# Patient Record
Sex: Female | Born: 1965 | Race: Black or African American | Hispanic: No | Smoking: Former smoker
Health system: Southern US, Community
[De-identification: ages and names within clinical notes are randomized; demographics above are authoritative.]

## PROBLEM LIST (undated history)

## (undated) DIAGNOSIS — F419 Anxiety disorder, unspecified: Secondary | ICD-10-CM

## (undated) DIAGNOSIS — I10 Essential (primary) hypertension: Secondary | ICD-10-CM

## (undated) DIAGNOSIS — D649 Anemia, unspecified: Secondary | ICD-10-CM

## (undated) DIAGNOSIS — F32A Depression, unspecified: Secondary | ICD-10-CM

## (undated) DIAGNOSIS — K219 Gastro-esophageal reflux disease without esophagitis: Secondary | ICD-10-CM

## (undated) DIAGNOSIS — F209 Schizophrenia, unspecified: Secondary | ICD-10-CM

## (undated) DIAGNOSIS — I509 Heart failure, unspecified: Secondary | ICD-10-CM

## (undated) DIAGNOSIS — M24549 Contracture, unspecified hand: Secondary | ICD-10-CM

## (undated) DIAGNOSIS — M1990 Unspecified osteoarthritis, unspecified site: Secondary | ICD-10-CM

## (undated) DIAGNOSIS — F329 Major depressive disorder, single episode, unspecified: Secondary | ICD-10-CM

## (undated) DIAGNOSIS — I209 Angina pectoris, unspecified: Secondary | ICD-10-CM

## (undated) DIAGNOSIS — G8250 Quadriplegia, unspecified: Secondary | ICD-10-CM

## (undated) DIAGNOSIS — N319 Neuromuscular dysfunction of bladder, unspecified: Secondary | ICD-10-CM

## (undated) DIAGNOSIS — J189 Pneumonia, unspecified organism: Secondary | ICD-10-CM

## (undated) DIAGNOSIS — Z9981 Dependence on supplemental oxygen: Secondary | ICD-10-CM

## (undated) DIAGNOSIS — I82401 Acute embolism and thrombosis of unspecified deep veins of right lower extremity: Secondary | ICD-10-CM

## (undated) DIAGNOSIS — N200 Calculus of kidney: Secondary | ICD-10-CM

## (undated) DIAGNOSIS — Z9289 Personal history of other medical treatment: Secondary | ICD-10-CM

## (undated) DIAGNOSIS — J9690 Respiratory failure, unspecified, unspecified whether with hypoxia or hypercapnia: Secondary | ICD-10-CM

## (undated) HISTORY — PX: CYSTOSCOPY/RETROGRADE/URETEROSCOPY/STONE EXTRACTION WITH BASKET: SHX5317

## (undated) HISTORY — PX: GASTROSTOMY W/ FEEDING TUBE: SUR642

## (undated) HISTORY — PX: LITHOTRIPSY: SUR834

## (undated) HISTORY — PX: PEG PLACEMENT: SHX5437

---
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
## 1994-02-12 HISTORY — PX: TUBAL LIGATION: SHX77

## 2009-02-12 HISTORY — PX: TRACHEOSTOMY: SUR1362

## 2009-05-13 ENCOUNTER — Inpatient Hospital Stay (HOSPITAL_COMMUNITY): Admission: EM | Admit: 2009-05-13 | Discharge: 2009-05-16 | Payer: Self-pay | Admitting: Emergency Medicine

## 2009-10-02 ENCOUNTER — Emergency Department (HOSPITAL_COMMUNITY): Admission: EM | Admit: 2009-10-02 | Discharge: 2009-10-03 | Payer: Self-pay | Admitting: Emergency Medicine

## 2009-10-27 ENCOUNTER — Emergency Department (HOSPITAL_COMMUNITY): Admission: EM | Admit: 2009-10-27 | Discharge: 2009-10-28 | Payer: Self-pay | Admitting: Emergency Medicine

## 2009-12-06 ENCOUNTER — Ambulatory Visit: Payer: Self-pay | Admitting: Pulmonary Disease

## 2009-12-06 ENCOUNTER — Inpatient Hospital Stay: Admission: EM | Admit: 2009-12-06 | Discharge: 2010-01-04 | Payer: Self-pay | Admitting: Internal Medicine

## 2010-01-15 ENCOUNTER — Inpatient Hospital Stay (HOSPITAL_COMMUNITY)
Admission: EM | Admit: 2010-01-15 | Discharge: 2010-01-20 | Payer: Self-pay | Source: Home / Self Care | Attending: Internal Medicine | Admitting: Internal Medicine

## 2010-04-25 LAB — RETICULOCYTES
RBC.: 3.25 MIL/uL — ABNORMAL LOW (ref 3.87–5.11)
RBC.: 3.49 MIL/uL — ABNORMAL LOW (ref 3.87–5.11)
RBC.: 3.73 MIL/uL — ABNORMAL LOW (ref 3.87–5.11)
Retic Count, Absolute: 48.9 10*3/uL (ref 19.0–186.0)
Retic Ct Pct: 1.2 % (ref 0.4–3.1)

## 2010-04-25 LAB — URINE CULTURE
Colony Count: 90000
Culture  Setup Time: 201112051020
Culture  Setup Time: 201111151025

## 2010-04-25 LAB — IRON AND TIBC
Iron: 44 ug/dL (ref 42–135)
Iron: 80 ug/dL (ref 42–135)
Saturation Ratios: 41 % (ref 20–55)
TIBC: 147 ug/dL — ABNORMAL LOW (ref 250–470)
UIBC: 84 ug/dL
UIBC: 222 ug/dL

## 2010-04-25 LAB — BASIC METABOLIC PANEL
BUN: 17 mg/dL (ref 6–23)
BUN: 20 mg/dL (ref 6–23)
BUN: 25 mg/dL — ABNORMAL HIGH (ref 6–23)
BUN: 30 mg/dL — ABNORMAL HIGH (ref 6–23)
BUN: 33 mg/dL — ABNORMAL HIGH (ref 6–23)
BUN: 33 mg/dL — ABNORMAL HIGH (ref 6–23)
BUN: 56 mg/dL — ABNORMAL HIGH (ref 6–23)
CO2: 26 mEq/L (ref 19–32)
CO2: 30 mEq/L (ref 19–32)
CO2: 31 mEq/L (ref 19–32)
CO2: 34 meq/L — ABNORMAL HIGH (ref 19–32)
Calcium: 8.8 mg/dL (ref 8.4–10.5)
Calcium: 8.9 mg/dL (ref 8.4–10.5)
Chloride: 100 meq/L (ref 96–112)
Chloride: 101 mEq/L (ref 96–112)
Chloride: 103 mEq/L (ref 96–112)
Chloride: 103 mEq/L (ref 96–112)
Chloride: 104 mEq/L (ref 96–112)
Chloride: 105 mEq/L (ref 96–112)
Chloride: 99 mEq/L (ref 96–112)
Creatinine, Ser: 0.8 mg/dL (ref 0.4–1.2)
Creatinine, Ser: 1 mg/dL (ref 0.4–1.2)
Creatinine, Ser: 1.14 mg/dL (ref 0.4–1.2)
Creatinine, Ser: 1.18 mg/dL (ref 0.4–1.2)
Creatinine, Ser: 1.19 mg/dL (ref 0.4–1.2)
GFR calc Af Amer: 60 mL/min — ABNORMAL LOW (ref >60)
GFR calc Af Amer: >60 mL/min (ref >60)
GFR calc non Af Amer: 50 mL/min — ABNORMAL LOW (ref >60)
GFR calc non Af Amer: 52 mL/min — ABNORMAL LOW (ref >60)
GFR calc non Af Amer: >60 mL/min (ref >60)
GFR calc non Af Amer: >60 mL/min (ref >60)
Glucose, Bld: 103 mg/dL — ABNORMAL HIGH (ref 70–99)
Glucose, Bld: 103 mg/dL — ABNORMAL HIGH (ref 70–99)
Glucose, Bld: 108 mg/dL — ABNORMAL HIGH (ref 70–99)
Glucose, Bld: 115 mg/dL — ABNORMAL HIGH (ref 70–99)
Glucose, Bld: 121 mg/dL — ABNORMAL HIGH (ref 70–99)
Glucose, Bld: 141 mg/dL — ABNORMAL HIGH (ref 70–99)
Glucose, Bld: 91 mg/dL (ref 70–99)
Potassium: 4.3 mEq/L (ref 3.5–5.1)
Potassium: 4.4 mEq/L (ref 3.5–5.1)
Potassium: 4.7 mEq/L (ref 3.5–5.1)
Potassium: 4.7 mEq/L (ref 3.5–5.1)
Potassium: 4.7 mEq/L (ref 3.5–5.1)
Potassium: 4.7 mEq/L (ref 3.5–5.1)
Potassium: 5 mEq/L (ref 3.5–5.1)
Potassium: 5.3 mEq/L — ABNORMAL HIGH (ref 3.5–5.1)
Sodium: 139 mEq/L (ref 135–145)
Sodium: 142 mEq/L (ref 135–145)

## 2010-04-25 LAB — GLUCOSE, CAPILLARY
Glucose-Capillary: 104 mg/dL — ABNORMAL HIGH (ref 70–99)
Glucose-Capillary: 120 mg/dL — ABNORMAL HIGH (ref 70–99)
Glucose-Capillary: 136 mg/dL — ABNORMAL HIGH (ref 70–99)
Glucose-Capillary: 150 mg/dL — ABNORMAL HIGH (ref 70–99)
Glucose-Capillary: 81 mg/dL (ref 70–99)
Glucose-Capillary: 86 mg/dL (ref 70–99)
Glucose-Capillary: 97 mg/dL (ref 70–99)

## 2010-04-25 LAB — CBC
HCT: 26.7 % — ABNORMAL LOW (ref 36.0–46.0)
HCT: 27.1 % — ABNORMAL LOW (ref 36.0–46.0)
HCT: 27.4 % — ABNORMAL LOW (ref 36.0–46.0)
HCT: 29.4 % — ABNORMAL LOW (ref 36.0–46.0)
HCT: 30.4 % — ABNORMAL LOW (ref 36.0–46.0)
HCT: 32.2 % — ABNORMAL LOW (ref 36.0–46.0)
HCT: 32.6 % — ABNORMAL LOW (ref 36.0–46.0)
HCT: 36.4 % (ref 36.0–46.0)
Hemoglobin: 8 g/dL — ABNORMAL LOW (ref 12.0–15.0)
Hemoglobin: 8 g/dL — ABNORMAL LOW (ref 12.0–15.0)
Hemoglobin: 8.6 g/dL — ABNORMAL LOW (ref 12.0–15.0)
Hemoglobin: 9.7 g/dL — ABNORMAL LOW (ref 12.0–15.0)
MCH: 23.5 pg — ABNORMAL LOW (ref 26.0–34.0)
MCH: 24.1 pg — ABNORMAL LOW (ref 26.0–34.0)
MCH: 24.1 pg — ABNORMAL LOW (ref 26.0–34.0)
MCH: 22.9 pg — ABNORMAL LOW (ref 26.0–34.0)
MCH: 23.7 pg — ABNORMAL LOW (ref 26.0–34.0)
MCH: 23.7 pg — ABNORMAL LOW (ref 26.0–34.0)
MCH: 24 pg — ABNORMAL LOW (ref 26.0–34.0)
MCH: 24.2 pg — ABNORMAL LOW (ref 26.0–34.0)
MCHC: 29.9 g/dL — ABNORMAL LOW (ref 30.0–36.0)
MCHC: 31.1 g/dL (ref 30.0–36.0)
MCHC: 31.4 g/dL (ref 30.0–36.0)
MCHC: 29.2 g/dL — ABNORMAL LOW (ref 30.0–36.0)
MCHC: 29.2 g/dL — ABNORMAL LOW (ref 30.0–36.0)
MCHC: 29.3 g/dL — ABNORMAL LOW (ref 30.0–36.0)
MCHC: 29.8 g/dL — ABNORMAL LOW (ref 30.0–36.0)
MCHC: 29.9 g/dL — ABNORMAL LOW (ref 30.0–36.0)
MCHC: 30.1 g/dL (ref 30.0–36.0)
MCHC: 30.8 g/dL (ref 30.0–36.0)
MCV: 76.6 fL — ABNORMAL LOW (ref 78.0–100.0)
MCV: 77.4 fL — ABNORMAL LOW (ref 78.0–100.0)
MCV: 77.9 fL — ABNORMAL LOW (ref 78.0–100.0)
MCV: 78.6 fL (ref 78.0–100.0)
MCV: 77.8 fL — ABNORMAL LOW (ref 78.0–100.0)
MCV: 77.9 fL — ABNORMAL LOW (ref 78.0–100.0)
MCV: 78.3 fL (ref 78.0–100.0)
MCV: 78.5 fL (ref 78.0–100.0)
MCV: 78.7 fL (ref 78.0–100.0)
MCV: 78.7 fL (ref 78.0–100.0)
MCV: 79.2 fL (ref 78.0–100.0)
MCV: 80.3 fL (ref 78.0–100.0)
MCV: 80.4 fL (ref 78.0–100.0)
MCV: 80.9 fL (ref 78.0–100.0)
Platelets: 145 10*3/uL — ABNORMAL LOW (ref 150–400)
Platelets: 162 10*3/uL (ref 150–400)
Platelets: 181 10*3/uL (ref 150–400)
Platelets: 226 10*3/uL (ref 150–400)
Platelets: 167 10*3/uL (ref 150–400)
Platelets: 181 10*3/uL (ref 150–400)
Platelets: 188 10*3/uL (ref 150–400)
Platelets: 202 10*3/uL (ref 150–400)
Platelets: 238 10*3/uL (ref 150–400)
RBC: 2.99 MIL/uL — ABNORMAL LOW (ref 3.87–5.11)
RBC: 3.45 MIL/uL — ABNORMAL LOW (ref 3.87–5.11)
RBC: 4.15 MIL/uL (ref 3.87–5.11)
RBC: 3.48 MIL/uL — ABNORMAL LOW (ref 3.87–5.11)
RBC: 4.41 MIL/uL (ref 3.87–5.11)
RBC: 4.5 MIL/uL (ref 3.87–5.11)
RDW: 14.4 % (ref 11.5–15.5)
RDW: 14.9 % (ref 11.5–15.5)
RDW: 13.7 % (ref 11.5–15.5)
RDW: 13.8 % (ref 11.5–15.5)
RDW: 14.1 % (ref 11.5–15.5)
RDW: 14.9 % (ref 11.5–15.5)
RDW: 14.9 % (ref 11.5–15.5)
RDW: 15.3 % (ref 11.5–15.5)
RDW: 15.3 % (ref 11.5–15.5)
WBC: 11.3 10*3/uL — ABNORMAL HIGH (ref 4.0–10.5)
WBC: 11.6 10*3/uL — ABNORMAL HIGH (ref 4.0–10.5)
WBC: 7.6 10*3/uL (ref 4.0–10.5)
WBC: 7.6 10*3/uL (ref 4.0–10.5)
WBC: 7.6 10*3/uL (ref 4.0–10.5)
WBC: 7.9 10*3/uL (ref 4.0–10.5)

## 2010-04-25 LAB — PROCALCITONIN
Procalcitonin: 1.22 ng/mL
Procalcitonin: <0.1 ng/mL

## 2010-04-25 LAB — CROSSMATCH: Unit division: 0

## 2010-04-25 LAB — CULTURE, BLOOD (ROUTINE X 2)
Culture  Setup Time: 201111160416
Culture: NO GROWTH
Culture: NO GROWTH
Culture: NO GROWTH

## 2010-04-25 LAB — DIFFERENTIAL
Basophils Absolute: 0 10*3/uL (ref 0.0–0.1)
Basophils Absolute: 0 10*3/uL (ref 0.0–0.1)
Basophils Relative: 0 % (ref 0–1)
Basophils Relative: 0 % (ref 0–1)
Eosinophils Absolute: 0 10*3/uL (ref 0.0–0.7)
Eosinophils Absolute: 0.6 10*3/uL (ref 0.0–0.7)
Eosinophils Relative: 3 % (ref 0–5)
Eosinophils Relative: 8 % — ABNORMAL HIGH (ref 0–5)
Lymphocytes Relative: 17 % (ref 12–46)
Lymphocytes Relative: 32 % (ref 12–46)
Lymphs Abs: 2.4 10*3/uL (ref 0.7–4.0)
Monocytes Absolute: 0.7 10*3/uL (ref 0.1–1.0)
Monocytes Relative: 10 % (ref 3–12)
Monocytes Relative: 10 % (ref 3–12)
Neutro Abs: 4.2 10*3/uL (ref 1.7–7.7)
Neutrophils Relative %: 77 % (ref 43–77)
Neutrophils Relative %: 50 % (ref 43–77)

## 2010-04-25 LAB — URINALYSIS, ROUTINE W REFLEX MICROSCOPIC
Bilirubin Urine: NEGATIVE
Ketones, ur: NEGATIVE mg/dL
Ketones, ur: NEGATIVE mg/dL
Nitrite: POSITIVE — AB
Nitrite: POSITIVE — AB
Protein, ur: 30 mg/dL — AB
Protein, ur: 100 mg/dL — AB
Specific Gravity, Urine: 1.009 (ref 1.005–1.030)
Urobilinogen, UA: 0.2 mg/dL (ref 0.0–1.0)
pH: 7 (ref 5.0–8.0)

## 2010-04-25 LAB — WOUND CULTURE
Culture: NO GROWTH
Gram Stain: NONE SEEN

## 2010-04-25 LAB — FOLATE
Folate: >20 ng/mL
Folate: >20 ng/mL

## 2010-04-25 LAB — COMPREHENSIVE METABOLIC PANEL
ALT: 12 U/L (ref 0–35)
ALT: 24 U/L (ref 0–35)
AST: 10 U/L (ref 0–37)
Albumin: 2 g/dL — ABNORMAL LOW (ref 3.5–5.2)
Albumin: 3.1 g/dL — ABNORMAL LOW (ref 3.5–5.2)
Alkaline Phosphatase: 53 U/L (ref 39–117)
Alkaline Phosphatase: 77 U/L (ref 39–117)
BUN: 17 mg/dL (ref 6–23)
BUN: 19 mg/dL (ref 6–23)
BUN: 7 mg/dL (ref 6–23)
CO2: 23 mEq/L (ref 19–32)
Chloride: 101 mEq/L (ref 96–112)
Chloride: 115 mEq/L — ABNORMAL HIGH (ref 96–112)
Creatinine, Ser: 1.3 mg/dL — ABNORMAL HIGH (ref 0.4–1.2)
GFR calc non Af Amer: >60 mL/min (ref >60)
Glucose, Bld: 89 mg/dL (ref 70–99)
Potassium: 3.6 mEq/L (ref 3.5–5.1)
Potassium: 4.1 mEq/L (ref 3.5–5.1)
Sodium: 144 mEq/L (ref 135–145)
Total Bilirubin: 0.5 mg/dL (ref 0.3–1.2)
Total Bilirubin: 0.6 mg/dL (ref 0.3–1.2)
Total Bilirubin: 0.7 mg/dL (ref 0.3–1.2)
Total Protein: 6.4 g/dL (ref 6.0–8.3)

## 2010-04-25 LAB — HEMOGLOBIN A1C: Mean Plasma Glucose: 120 mg/dL — ABNORMAL HIGH (ref <117)

## 2010-04-25 LAB — FERRITIN
Ferritin: 1000 ng/mL — ABNORMAL HIGH (ref 10–291)
Ferritin: 1081 ng/mL — ABNORMAL HIGH (ref 10–291)

## 2010-04-25 LAB — D-DIMER, QUANTITATIVE: D-Dimer, Quant: 1.87 ug/mL-FEU — ABNORMAL HIGH (ref 0.00–0.48)

## 2010-04-25 LAB — CK TOTAL AND CKMB (NOT AT ARMC)
CK, MB: 1.3 ng/mL (ref 0.3–4.0)
Relative Index: INVALID (ref 0.0–2.5)
Total CK: 45 U/L (ref 7–177)

## 2010-04-25 LAB — BLOOD GAS, ARTERIAL
Acid-Base Excess: 1.4 mmol/L (ref 0.0–2.0)
Drawn by: 309961
O2 Saturation: 86.5 %
Patient temperature: 98.6

## 2010-04-25 LAB — CULTURE, RESPIRATORY W GRAM STAIN

## 2010-04-25 LAB — URINE MICROSCOPIC-ADD ON

## 2010-04-25 LAB — VITAMIN B12: Vitamin B-12: 632 pg/mL (ref 211–911)

## 2010-04-25 LAB — HEMOCCULT GUIAC POC 1CARD (OFFICE)
Fecal Occult Bld: NEGATIVE
Fecal Occult Bld: NEGATIVE

## 2010-04-25 LAB — LEGIONELLA ANTIGEN, URINE: Legionella Antigen, Urine: NEGATIVE

## 2010-04-25 LAB — VANCOMYCIN, TROUGH: Vancomycin Tr: 37.1 ug/mL (ref 10.0–20.0)

## 2010-04-25 LAB — PREALBUMIN: Prealbumin: 17.3 mg/dL — ABNORMAL LOW (ref 18.0–45.0)

## 2010-04-25 LAB — LACTIC ACID, PLASMA: Lactic Acid, Venous: 0.8 mmol/L (ref 0.5–2.2)

## 2010-04-26 LAB — URINE CULTURE
Colony Count: NO GROWTH
Culture  Setup Time: 201110260218
Culture: NO GROWTH

## 2010-04-26 LAB — BASIC METABOLIC PANEL
BUN: 43 mg/dL — ABNORMAL HIGH (ref 6–23)
BUN: 45 mg/dL — ABNORMAL HIGH (ref 6–23)
BUN: 56 mg/dL — ABNORMAL HIGH (ref 6–23)
CO2: 32 meq/L (ref 19–32)
CO2: 33 mEq/L — ABNORMAL HIGH (ref 19–32)
CO2: 34 mEq/L — ABNORMAL HIGH (ref 19–32)
CO2: 36 mEq/L — ABNORMAL HIGH (ref 19–32)
CO2: 36 mEq/L — ABNORMAL HIGH (ref 19–32)
Calcium: 8.6 mg/dL (ref 8.4–10.5)
Calcium: 8.6 mg/dL (ref 8.4–10.5)
Calcium: 8.9 mg/dL (ref 8.4–10.5)
Calcium: 9 mg/dL (ref 8.4–10.5)
Calcium: 9.3 mg/dL (ref 8.4–10.5)
Chloride: 104 mEq/L (ref 96–112)
Chloride: 95 mEq/L — ABNORMAL LOW (ref 96–112)
Chloride: 96 meq/L (ref 96–112)
Chloride: 97 mEq/L (ref 96–112)
Creatinine, Ser: 0.79 mg/dL (ref 0.4–1.2)
Creatinine, Ser: 0.97 mg/dL (ref 0.4–1.2)
Creatinine, Ser: 1.19 mg/dL (ref 0.4–1.2)
Creatinine, Ser: 1.23 mg/dL — ABNORMAL HIGH (ref 0.4–1.2)
Creatinine, Ser: 1.5 mg/dL — ABNORMAL HIGH (ref 0.4–1.2)
GFR calc Af Amer: 45 mL/min — ABNORMAL LOW (ref >60)
GFR calc Af Amer: 57 mL/min — ABNORMAL LOW (ref >60)
GFR calc Af Amer: 59 mL/min — ABNORMAL LOW (ref >60)
GFR calc Af Amer: >60 mL/min (ref >60)
GFR calc non Af Amer: 38 mL/min — ABNORMAL LOW (ref >60)
GFR calc non Af Amer: 47 mL/min — ABNORMAL LOW (ref >60)
GFR calc non Af Amer: 49 mL/min — ABNORMAL LOW (ref >60)
GFR calc non Af Amer: >60 mL/min (ref >60)
Glucose, Bld: 104 mg/dL — ABNORMAL HIGH (ref 70–99)
Glucose, Bld: 107 mg/dL — ABNORMAL HIGH (ref 70–99)
Glucose, Bld: 117 mg/dL — ABNORMAL HIGH (ref 70–99)
Glucose, Bld: 133 mg/dL — ABNORMAL HIGH (ref 70–99)
Glucose, Bld: 97 mg/dL (ref 70–99)
Potassium: 4.9 meq/L (ref 3.5–5.1)
Potassium: 5.2 meq/L — ABNORMAL HIGH (ref 3.5–5.1)
Potassium: 6.2 mEq/L — ABNORMAL HIGH (ref 3.5–5.1)
Sodium: 134 meq/L — ABNORMAL LOW (ref 135–145)
Sodium: 139 meq/L (ref 135–145)
Sodium: 141 mEq/L (ref 135–145)
Sodium: 142 mEq/L (ref 135–145)

## 2010-04-26 LAB — CBC
HCT: 25.7 % — ABNORMAL LOW (ref 36.0–46.0)
HCT: 30.7 % — ABNORMAL LOW (ref 36.0–46.0)
HCT: 31.8 % — ABNORMAL LOW (ref 36.0–46.0)
Hemoglobin: 9.1 g/dL — ABNORMAL LOW (ref 12.0–15.0)
Hemoglobin: 9.4 g/dL — ABNORMAL LOW (ref 12.0–15.0)
MCH: 22.5 pg — ABNORMAL LOW (ref 26.0–34.0)
MCH: 22.6 pg — ABNORMAL LOW (ref 26.0–34.0)
MCH: 22.9 pg — ABNORMAL LOW (ref 26.0–34.0)
MCH: 23 pg — ABNORMAL LOW (ref 26.0–34.0)
MCHC: 29.6 g/dL — ABNORMAL LOW (ref 30.0–36.0)
MCHC: 29.6 g/dL — ABNORMAL LOW (ref 30.0–36.0)
MCHC: 29.9 g/dL — ABNORMAL LOW (ref 30.0–36.0)
MCHC: 30.2 g/dL (ref 30.0–36.0)
MCV: 74.4 fL — ABNORMAL LOW (ref 78.0–100.0)
MCV: 76.4 fL — ABNORMAL LOW (ref 78.0–100.0)
MCV: 76.5 fL — ABNORMAL LOW (ref 78.0–100.0)
MCV: 77.8 fL — ABNORMAL LOW (ref 78.0–100.0)
Platelets: 176 10*3/uL (ref 150–400)
Platelets: 221 10*3/uL (ref 150–400)
Platelets: 283 10*3/uL (ref 150–400)
Platelets: 297 10*3/uL (ref 150–400)
RBC: 3.47 MIL/uL — ABNORMAL LOW (ref 3.87–5.11)
RBC: 4.02 MIL/uL (ref 3.87–5.11)
RBC: 4.09 MIL/uL (ref 3.87–5.11)
RDW: 13.2 % (ref 11.5–15.5)
RDW: 13.5 % (ref 11.5–15.5)
RDW: 14 % (ref 11.5–15.5)
RDW: 14 % (ref 11.5–15.5)
WBC: 10.1 10*3/uL (ref 4.0–10.5)
WBC: 11.1 10*3/uL — ABNORMAL HIGH (ref 4.0–10.5)
WBC: 8.9 10*3/uL (ref 4.0–10.5)
WBC: 9 10*3/uL (ref 4.0–10.5)

## 2010-04-26 LAB — IRON AND TIBC
Saturation Ratios: 19 % — ABNORMAL LOW (ref 20–55)
TIBC: 155 ug/dL — ABNORMAL LOW (ref 250–470)

## 2010-04-26 LAB — DIFFERENTIAL
Basophils Absolute: 0 10*3/uL (ref 0.0–0.1)
Basophils Absolute: 0.1 10*3/uL (ref 0.0–0.1)
Basophils Relative: 1 % (ref 0–1)
Eosinophils Absolute: 0.4 10*3/uL (ref 0.0–0.7)
Eosinophils Absolute: 0.4 10*3/uL (ref 0.0–0.7)
Eosinophils Relative: 4 % (ref 0–5)
Eosinophils Relative: 4 % (ref 0–5)
Lymphocytes Relative: 38 % (ref 12–46)
Lymphs Abs: 2.7 10*3/uL (ref 0.7–4.0)
Lymphs Abs: 3.1 10*3/uL (ref 0.7–4.0)
Monocytes Absolute: 0.8 10*3/uL (ref 0.1–1.0)
Monocytes Relative: 7 % (ref 3–12)
Monocytes Relative: 7 % (ref 3–12)
Monocytes Relative: 9 % (ref 3–12)
Neutro Abs: 4.6 10*3/uL (ref 1.7–7.7)
Neutro Abs: 4.9 10*3/uL (ref 1.7–7.7)
Neutrophils Relative %: 56 % (ref 43–77)

## 2010-04-26 LAB — BLOOD GAS, ARTERIAL
Acid-Base Excess: 5.6 mmol/L — ABNORMAL HIGH (ref 0.0–2.0)
Bicarbonate: 29.7 mEq/L — ABNORMAL HIGH (ref 20.0–24.0)
FIO2: 0.4 %
RATE: 16 resp/min
TCO2: 31 mmol/L (ref 0–100)
pCO2 arterial: 44.6 mmHg (ref 35.0–45.0)
pO2, Arterial: 121 mmHg — ABNORMAL HIGH (ref 80.0–100.0)

## 2010-04-26 LAB — URINE MICROSCOPIC-ADD ON

## 2010-04-26 LAB — URINALYSIS, ROUTINE W REFLEX MICROSCOPIC
Bilirubin Urine: NEGATIVE
Glucose, UA: NEGATIVE mg/dL
Ketones, ur: NEGATIVE mg/dL
Ketones, ur: NEGATIVE mg/dL
Nitrite: NEGATIVE
Nitrite: NEGATIVE
Protein, ur: >300 mg/dL — AB
Protein, ur: NEGATIVE mg/dL
Specific Gravity, Urine: 1.01 (ref 1.005–1.030)
Urobilinogen, UA: 0.2 mg/dL (ref 0.0–1.0)
Urobilinogen, UA: 0.2 mg/dL (ref 0.0–1.0)
pH: 8.5 — ABNORMAL HIGH (ref 5.0–8.0)

## 2010-04-26 LAB — COMPREHENSIVE METABOLIC PANEL
ALT: <8 U/L (ref 0–35)
AST: 10 U/L (ref 0–37)
Alkaline Phosphatase: 68 U/L (ref 39–117)
GFR calc Af Amer: >60 mL/min (ref >60)
Glucose, Bld: 84 mg/dL (ref 70–99)
Potassium: 3.3 mEq/L — ABNORMAL LOW (ref 3.5–5.1)
Sodium: 139 mEq/L (ref 135–145)
Total Protein: 6 g/dL (ref 6.0–8.3)

## 2010-04-26 LAB — CULTURE, BLOOD (ROUTINE X 2)
Culture  Setup Time: 201110301845
Culture  Setup Time: 201110301845
Culture: NO GROWTH
Culture: NO GROWTH

## 2010-04-26 LAB — BRAIN NATRIURETIC PEPTIDE: Pro B Natriuretic peptide (BNP): 31 pg/mL (ref 0.0–100.0)

## 2010-04-26 LAB — VITAMIN B12: Vitamin B-12: 505 pg/mL (ref 211–911)

## 2010-04-26 LAB — PROCALCITONIN
Procalcitonin: 0.73 ng/mL
Procalcitonin: 4.85 ng/mL

## 2010-04-26 LAB — TSH: TSH: 0.306 u[IU]/mL — ABNORMAL LOW (ref 0.350–4.500)

## 2010-04-27 LAB — DIFFERENTIAL
Basophils Absolute: 0.1 10*3/uL (ref 0.0–0.1)
Eosinophils Absolute: 0.3 10*3/uL (ref 0.0–0.7)
Monocytes Absolute: 0.6 10*3/uL (ref 0.1–1.0)
Neutrophils Relative %: 54 % (ref 43–77)

## 2010-04-27 LAB — POCT CARDIAC MARKERS: Myoglobin, poc: 201 ng/mL (ref 12–200)

## 2010-04-27 LAB — URINALYSIS, ROUTINE W REFLEX MICROSCOPIC
Bilirubin Urine: NEGATIVE
Specific Gravity, Urine: 1.007 (ref 1.005–1.030)
pH: 7 (ref 5.0–8.0)

## 2010-04-27 LAB — CBC
HCT: 31.8 % — ABNORMAL LOW (ref 36.0–46.0)
Hemoglobin: 9.9 g/dL — ABNORMAL LOW (ref 12.0–15.0)
MCH: 23 pg — ABNORMAL LOW (ref 26.0–34.0)
MCHC: 31.1 g/dL (ref 30.0–36.0)
MCV: 73.8 fL — ABNORMAL LOW (ref 78.0–100.0)

## 2010-04-27 LAB — COMPREHENSIVE METABOLIC PANEL
Alkaline Phosphatase: 102 U/L (ref 39–117)
BUN: 27 mg/dL — ABNORMAL HIGH (ref 6–23)
Chloride: 105 mEq/L (ref 96–112)
Glucose, Bld: 139 mg/dL — ABNORMAL HIGH (ref 70–99)
Potassium: 4.1 mEq/L (ref 3.5–5.1)
Total Bilirubin: <0.1 mg/dL — ABNORMAL LOW (ref 0.3–1.2)

## 2010-04-27 LAB — URINE CULTURE

## 2010-04-27 LAB — URINE MICROSCOPIC-ADD ON

## 2010-05-03 LAB — URINE CULTURE: Colony Count: >=100000

## 2010-05-03 LAB — CULTURE, BLOOD (ROUTINE X 2)
Culture: NO GROWTH
Culture: NO GROWTH

## 2010-05-03 LAB — GLUCOSE, CAPILLARY
Glucose-Capillary: 110 mg/dL — ABNORMAL HIGH (ref 70–99)
Glucose-Capillary: 121 mg/dL — ABNORMAL HIGH (ref 70–99)
Glucose-Capillary: 134 mg/dL — ABNORMAL HIGH (ref 70–99)
Glucose-Capillary: 83 mg/dL (ref 70–99)
Glucose-Capillary: 88 mg/dL (ref 70–99)
Glucose-Capillary: 90 mg/dL (ref 70–99)

## 2010-05-03 LAB — URINALYSIS, ROUTINE W REFLEX MICROSCOPIC
Glucose, UA: NEGATIVE mg/dL
Protein, ur: NEGATIVE mg/dL
pH: 6.5 (ref 5.0–8.0)

## 2010-05-03 LAB — CBC
HCT: 35.6 % — ABNORMAL LOW (ref 36.0–46.0)
Hemoglobin: 9.5 g/dL — ABNORMAL LOW (ref 12.0–15.0)
Platelets: 244 10*3/uL (ref 150–400)
Platelets: 270 10*3/uL (ref 150–400)
RBC: 3.83 MIL/uL — ABNORMAL LOW (ref 3.87–5.11)
WBC: 15 10*3/uL — ABNORMAL HIGH (ref 4.0–10.5)

## 2010-05-03 LAB — COMPREHENSIVE METABOLIC PANEL
ALT: 13 U/L (ref 0–35)
AST: 18 U/L (ref 0–37)
Albumin: 3 g/dL — ABNORMAL LOW (ref 3.5–5.2)
Alkaline Phosphatase: 101 U/L (ref 39–117)
BUN: 19 mg/dL (ref 6–23)
Chloride: 102 mEq/L (ref 96–112)
GFR calc Af Amer: >60 mL/min (ref >60)
Potassium: 3.8 mEq/L (ref 3.5–5.1)
Total Bilirubin: 0.7 mg/dL (ref 0.3–1.2)

## 2010-05-03 LAB — BASIC METABOLIC PANEL
Calcium: 8.6 mg/dL (ref 8.4–10.5)
Creatinine, Ser: 0.85 mg/dL (ref 0.4–1.2)
GFR calc Af Amer: >60 mL/min (ref >60)
GFR calc non Af Amer: >60 mL/min (ref >60)
Sodium: 138 mEq/L (ref 135–145)

## 2010-05-03 LAB — URINE MICROSCOPIC-ADD ON

## 2010-05-08 LAB — CBC
HCT: 34 % — ABNORMAL LOW (ref 36.0–46.0)
Hemoglobin: 10.6 g/dL — ABNORMAL LOW (ref 12.0–15.0)
MCHC: 31.2 g/dL (ref 30.0–36.0)
MCV: 78.9 fL (ref 78.0–100.0)
RBC: 4.31 MIL/uL (ref 3.87–5.11)

## 2010-05-08 LAB — DIFFERENTIAL
Basophils Relative: 1 % (ref 0–1)
Eosinophils Absolute: 0.3 10*3/uL (ref 0.0–0.7)
Monocytes Absolute: 0.8 10*3/uL (ref 0.1–1.0)
Monocytes Relative: 6 % (ref 3–12)

## 2010-05-08 LAB — POCT I-STAT, CHEM 8
Calcium, Ion: 1.19 mmol/L (ref 1.12–1.32)
Chloride: 101 mEq/L (ref 96–112)
Glucose, Bld: 115 mg/dL — ABNORMAL HIGH (ref 70–99)
HCT: 38 % (ref 36.0–46.0)
Hemoglobin: 12.9 g/dL (ref 12.0–15.0)

## 2010-05-26 ENCOUNTER — Inpatient Hospital Stay (HOSPITAL_COMMUNITY)
Admission: EM | Admit: 2010-05-26 | Discharge: 2010-06-01 | DRG: 689 | Disposition: A | Discharge status: Skilled Nursing Facility | Payer: Medicare Other | Attending: Internal Medicine | Admitting: Internal Medicine

## 2010-05-26 ENCOUNTER — Emergency Department (HOSPITAL_COMMUNITY): Payer: Medicare Other

## 2010-05-26 DIAGNOSIS — L8992 Pressure ulcer of unspecified site, stage 2: Secondary | ICD-10-CM | POA: Diagnosis present

## 2010-05-26 DIAGNOSIS — D509 Iron deficiency anemia, unspecified: Secondary | ICD-10-CM | POA: Diagnosis present

## 2010-05-26 DIAGNOSIS — Z931 Gastrostomy status: Secondary | ICD-10-CM

## 2010-05-26 DIAGNOSIS — E46 Unspecified protein-calorie malnutrition: Secondary | ICD-10-CM | POA: Diagnosis present

## 2010-05-26 DIAGNOSIS — F3289 Other specified depressive episodes: Secondary | ICD-10-CM | POA: Diagnosis present

## 2010-05-26 DIAGNOSIS — Z981 Arthrodesis status: Secondary | ICD-10-CM

## 2010-05-26 DIAGNOSIS — N10 Acute tubulo-interstitial nephritis: Principal | ICD-10-CM | POA: Diagnosis present

## 2010-05-26 DIAGNOSIS — L89309 Pressure ulcer of unspecified buttock, unspecified stage: Secondary | ICD-10-CM | POA: Diagnosis present

## 2010-05-26 DIAGNOSIS — F329 Major depressive disorder, single episode, unspecified: Secondary | ICD-10-CM | POA: Diagnosis present

## 2010-05-26 DIAGNOSIS — E876 Hypokalemia: Secondary | ICD-10-CM | POA: Diagnosis present

## 2010-05-26 DIAGNOSIS — N1330 Unspecified hydronephrosis: Secondary | ICD-10-CM | POA: Diagnosis present

## 2010-05-26 DIAGNOSIS — Z87440 Personal history of urinary (tract) infections: Secondary | ICD-10-CM

## 2010-05-26 DIAGNOSIS — F259 Schizoaffective disorder, unspecified: Secondary | ICD-10-CM | POA: Diagnosis present

## 2010-05-26 DIAGNOSIS — M2450 Contracture, unspecified joint: Secondary | ICD-10-CM | POA: Diagnosis present

## 2010-05-26 DIAGNOSIS — B964 Proteus (mirabilis) (morganii) as the cause of diseases classified elsewhere: Secondary | ICD-10-CM | POA: Diagnosis present

## 2010-05-26 DIAGNOSIS — Z930 Tracheostomy status: Secondary | ICD-10-CM

## 2010-05-26 DIAGNOSIS — IMO0002 Reserved for concepts with insufficient information to code with codable children: Secondary | ICD-10-CM

## 2010-05-26 DIAGNOSIS — G8250 Quadriplegia, unspecified: Secondary | ICD-10-CM | POA: Diagnosis present

## 2010-05-26 LAB — BASIC METABOLIC PANEL
BUN: 22 mg/dL (ref 6–23)
Calcium: 9.3 mg/dL (ref 8.4–10.5)
Creatinine, Ser: 1.6 mg/dL — ABNORMAL HIGH (ref 0.4–1.2)
GFR calc non Af Amer: 35 mL/min — ABNORMAL LOW (ref >60)
Potassium: 4.6 mEq/L (ref 3.5–5.1)

## 2010-05-26 LAB — URINALYSIS, ROUTINE W REFLEX MICROSCOPIC
Bilirubin Urine: NEGATIVE
Ketones, ur: NEGATIVE mg/dL
Nitrite: NEGATIVE
Protein, ur: 100 mg/dL — AB
Urobilinogen, UA: 0.2 mg/dL (ref 0.0–1.0)
pH: 7 (ref 5.0–8.0)

## 2010-05-26 LAB — DIFFERENTIAL
Eosinophils Absolute: 0.5 10*3/uL (ref 0.0–0.7)
Eosinophils Relative: 3 % (ref 0–5)
Lymphs Abs: 3.4 10*3/uL (ref 0.7–4.0)
Monocytes Absolute: 1.6 10*3/uL — ABNORMAL HIGH (ref 0.1–1.0)

## 2010-05-26 LAB — CBC
MCHC: 31 g/dL (ref 30.0–36.0)
MCV: 74.9 fL — ABNORMAL LOW (ref 78.0–100.0)
Platelets: 291 10*3/uL (ref 150–400)
RDW: 14.2 % (ref 11.5–15.5)
WBC: 15.8 10*3/uL — ABNORMAL HIGH (ref 4.0–10.5)

## 2010-05-27 DIAGNOSIS — N390 Urinary tract infection, site not specified: Secondary | ICD-10-CM

## 2010-05-27 LAB — CBC
MCHC: 29.8 g/dL — ABNORMAL LOW (ref 30.0–36.0)
Platelets: 257 10*3/uL (ref 150–400)
RDW: 14.3 % (ref 11.5–15.5)
WBC: 13.1 10*3/uL — ABNORMAL HIGH (ref 4.0–10.5)

## 2010-05-27 LAB — COMPREHENSIVE METABOLIC PANEL
ALT: 18 U/L (ref 0–35)
Calcium: 8.3 mg/dL — ABNORMAL LOW (ref 8.4–10.5)
GFR calc Af Amer: 49 mL/min — ABNORMAL LOW (ref >60)
Glucose, Bld: 130 mg/dL — ABNORMAL HIGH (ref 70–99)
Sodium: 138 mEq/L (ref 135–145)
Total Protein: 6.9 g/dL (ref 6.0–8.3)

## 2010-05-29 LAB — CBC
HCT: 27.5 % — ABNORMAL LOW (ref 36.0–46.0)
MCHC: 29.5 g/dL — ABNORMAL LOW (ref 30.0–36.0)
MCV: 76.6 fL — ABNORMAL LOW (ref 78.0–100.0)
RDW: 15.3 % (ref 11.5–15.5)

## 2010-05-29 LAB — BASIC METABOLIC PANEL
BUN: 12 mg/dL (ref 6–23)
Calcium: 8.2 mg/dL — ABNORMAL LOW (ref 8.4–10.5)
Creatinine, Ser: 1.14 mg/dL (ref 0.4–1.2)
GFR calc non Af Amer: 52 mL/min — ABNORMAL LOW (ref >60)
Glucose, Bld: 96 mg/dL (ref 70–99)

## 2010-05-30 ENCOUNTER — Inpatient Hospital Stay (HOSPITAL_COMMUNITY): Payer: Medicare Other

## 2010-05-30 DIAGNOSIS — N390 Urinary tract infection, site not specified: Secondary | ICD-10-CM

## 2010-05-30 LAB — BASIC METABOLIC PANEL
Calcium: 8.4 mg/dL (ref 8.4–10.5)
GFR calc Af Amer: 59 mL/min — ABNORMAL LOW (ref >60)
GFR calc non Af Amer: 49 mL/min — ABNORMAL LOW (ref >60)
Sodium: 144 mEq/L (ref 135–145)

## 2010-05-30 LAB — CBC
MCHC: 29.3 g/dL — ABNORMAL LOW (ref 30.0–36.0)
RDW: 15.3 % (ref 11.5–15.5)

## 2010-05-30 LAB — GLUCOSE, CAPILLARY: Glucose-Capillary: 93 mg/dL (ref 70–99)

## 2010-05-31 LAB — CBC
MCH: 22.6 pg — ABNORMAL LOW (ref 26.0–34.0)
MCHC: 29.6 g/dL — ABNORMAL LOW (ref 30.0–36.0)
MCV: 76.4 fL — ABNORMAL LOW (ref 78.0–100.0)
Platelets: 235 10*3/uL (ref 150–400)
RBC: 3.81 MIL/uL — ABNORMAL LOW (ref 3.87–5.11)
RDW: 15.4 % (ref 11.5–15.5)

## 2010-05-31 LAB — URINE CULTURE
Colony Count: >=100000
Culture  Setup Time: 201204140109

## 2010-05-31 LAB — BASIC METABOLIC PANEL
BUN: 11 mg/dL (ref 6–23)
Creatinine, Ser: 1.02 mg/dL (ref 0.4–1.2)
GFR calc non Af Amer: 59 mL/min — ABNORMAL LOW (ref >60)
Glucose, Bld: 96 mg/dL (ref 70–99)
Potassium: 4.5 mEq/L (ref 3.5–5.1)

## 2010-06-01 LAB — COMPREHENSIVE METABOLIC PANEL
CO2: 28 mEq/L (ref 19–32)
Calcium: 8.8 mg/dL (ref 8.4–10.5)
Creatinine, Ser: 1.04 mg/dL (ref 0.4–1.2)
GFR calc Af Amer: >60 mL/min (ref >60)
GFR calc non Af Amer: 57 mL/min — ABNORMAL LOW (ref >60)
Glucose, Bld: 83 mg/dL (ref 70–99)

## 2010-06-01 LAB — DIFFERENTIAL
Basophils Relative: 0 % (ref 0–1)
Eosinophils Relative: 4 % (ref 0–5)
Monocytes Absolute: 0.7 10*3/uL (ref 0.1–1.0)
Monocytes Relative: 7 % (ref 3–12)
Neutro Abs: 5.5 10*3/uL (ref 1.7–7.7)

## 2010-06-01 LAB — CBC
HCT: 29.8 % — ABNORMAL LOW (ref 36.0–46.0)
Hemoglobin: 8.6 g/dL — ABNORMAL LOW (ref 12.0–15.0)
MCH: 22.1 pg — ABNORMAL LOW (ref 26.0–34.0)
MCHC: 28.9 g/dL — ABNORMAL LOW (ref 30.0–36.0)

## 2010-06-01 LAB — MAGNESIUM: Magnesium: 2.3 mg/dL (ref 1.5–2.5)
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
## 2010-06-02 LAB — CULTURE, BLOOD (ROUTINE X 2)
Culture  Setup Time: 201204140114
Culture: NO GROWTH
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
## 2010-06-13 NOTE — Group Therapy Note (Signed)
  NAMEVONNE, [name]              ACCOUNT NO.:  [id number]  MEDICAL RECORD NO.:  [id number]           PATIENT TYPE:  I  LOCATION:  1512                         FACILITY:  [facility]  PHYSICIAN:  Homero Fellers, MD   DATE OF BIRTH:  April 03, 1965                                PROGRESS NOTE   SUBJECTIVE: The patient was admitted yesterday for acute pyelonephritis with CT findings showing bilateral hydronephrosis with non obstructing stones. The patient is currently on imipenem and is currently being followed by Infectious Disease.  She has a tracheostomy tube, PEG tube, Port-A-Cath, and a Foley catheter.  OBJECTIVE: Vital signs:  Blood pressure of 113/73, pulse 97, respirations 20, temperature 97.9, O2 sat 100% on 35% trach collar.  General:  The patient is lying in bed, appeared comfortable.  She is contracted.  She is in no distress.  Neck:  Supple.  No JVD.  Lungs:  Transmitted sounds but no wheezing or crackles.  Heart:  S1, S2.  No murmurs, rubs or gallops.  Abdomen:  Obese, soft, nontender.  There is a PEG tube in the epigastric region.  Bowel sounds present.  Extremities:  1+ edema bilaterally.  Neurologic:  The patient is nonverbal because of a tracheostomy.  Laboratory:  Urine culture is growing gram-negative rods and enterococcus species is also present.  Potassium is 4.3, BUN 20, creatinine 1.4.  Liver enzymes are normal.  Hemoglobin 8.2, white count 13.1, platelet count is 257.  MRSA by PCR is positive.  ASSESSMENT AND PLAN: 1. Pyelonephritis with mild hydronephrosis:  Currently on IV     antibiotics and being followed by Infectious Disease. 2. Leukocytosis, likely secondary to #1:  This appears to be     resolving. 3. Anemia:  This will be monitored and may require transfusion if     continues to drop. 4. Right buttock decubitus ulcer:  A Wound consult has been placed. 5. Schizophrenia:  On appropriate medication. 6. Quadriplegia:  The patient continued to get  supportive care     including tracheostomy care, deep venous thrombosis prophylaxis,     and wound care. 7. Overall condition appears fairly stable.     Homero Fellers, MD     FA/MEDQ  D:  05/27/2010  T:  05/27/2010  Job:  [id number]  Electronically Signed by Homero Fellers  on 06/13/2010 02:42:14 AM

## 2010-06-13 NOTE — Group Therapy Note (Signed)
  [name], [name]              ACCOUNT NO.:  [id number]  MEDICAL RECORD NO.:  [id number]           PATIENT TYPE:  I  LOCATION:  1512                         FACILITY:  [facility]  PHYSICIAN:  Homero Fellers, MD   DATE OF BIRTH:  1965/04/08                                PROGRESS NOTE   SUBJECTIVE: The patient appeared to be doing better today.  Complained of mild throat comfort.  No fever, chest pain, or shortness of breath.  Appetite is fair.  The patient has a PEG tube but is able to take food by mouth.  OBJECTIVE: VITAL SIGNS:  Blood pressure of 116/73, pulse 98, respirations 20, temperature is 97.8, and O2 sat is 99% on trach collar of 35%. GENERAL:  The patient is sleepy but easily arousable, appears to be in no distress. HEENT:  Pallor.  Extraocular muscles are intact. NECK:  Supple.  No JVD, adenopathy, or thyromegaly.  There is a tracheostomy tube with no drainage surrounding it. LUNGS:  Clear to auscultation bilaterally, though reduced at the bases. HEART:  S1-S2, no murmurs. ABDOMEN:  Obese, soft, and nontender.  PEG tube is present. EXTREMITIES:  Contracted with trace edema bilaterally.  LABORATORY DATA: Blood cultures negative to date.  Urine culture showing gram-negative rods and enterococcus.  The identification is pending.  Appreciate input from Infectious Disease.  ASSESSMENT: 1. Pyelonephritis with evidence of mild hydronephrosis bilaterally,     currently on intravenous antibiotics.  She is now on __________ and     Zyvox and also with wound care.  She is being followed by     Infectious Disease. 2. Resolving leukocytosis. 3. Quadriplegia. 4. Right buttock decubitus ulcer, currently receiving wound care. 5. Schizophrenia. 6. Throat pain which should respond to supportive care. 7. Anemia.  This should be closely followed up.  The patient to be     transfused if it continues to drop.  CBC is due for tomorrow. 8. Debility.  PLAN: 1. Continue antibiotics  as above. 2. Follow Infectious Disease recommendation.  I would discontinue IV     fluids.  CONDITION: Condition overall is improving.     Homero Fellers, MD     FA/MEDQ  D:  05/28/2010  T:  05/28/2010  Job:  [id number]  Electronically Signed by Homero Fellers  on 06/13/2010 02:42:25 AM

## 2010-06-28 ENCOUNTER — Emergency Department (HOSPITAL_COMMUNITY): Payer: Medicare Other

## 2010-06-28 ENCOUNTER — Encounter (HOSPITAL_COMMUNITY): Payer: Self-pay | Admitting: Radiology

## 2010-06-28 ENCOUNTER — Inpatient Hospital Stay (HOSPITAL_COMMUNITY)
Admission: EM | Admit: 2010-06-28 | Discharge: 2010-07-01 | DRG: 698 | Disposition: A | Discharge status: Skilled Nursing Facility | Payer: Medicare Other | Attending: Internal Medicine | Admitting: Internal Medicine

## 2010-06-28 DIAGNOSIS — T83511A Infection and inflammatory reaction due to indwelling urethral catheter, initial encounter: Principal | ICD-10-CM | POA: Diagnosis present

## 2010-06-28 DIAGNOSIS — Y846 Urinary catheterization as the cause of abnormal reaction of the patient, or of later complication, without mention of misadventure at the time of the procedure: Secondary | ICD-10-CM | POA: Diagnosis present

## 2010-06-28 DIAGNOSIS — D509 Iron deficiency anemia, unspecified: Secondary | ICD-10-CM | POA: Diagnosis present

## 2010-06-28 DIAGNOSIS — Z9981 Dependence on supplemental oxygen: Secondary | ICD-10-CM

## 2010-06-28 DIAGNOSIS — G8250 Quadriplegia, unspecified: Secondary | ICD-10-CM | POA: Diagnosis present

## 2010-06-28 DIAGNOSIS — L89109 Pressure ulcer of unspecified part of back, unspecified stage: Secondary | ICD-10-CM | POA: Diagnosis present

## 2010-06-28 DIAGNOSIS — Z91040 Latex allergy status: Secondary | ICD-10-CM

## 2010-06-28 DIAGNOSIS — I498 Other specified cardiac arrhythmias: Secondary | ICD-10-CM | POA: Diagnosis present

## 2010-06-28 DIAGNOSIS — L8992 Pressure ulcer of unspecified site, stage 2: Secondary | ICD-10-CM | POA: Diagnosis present

## 2010-06-28 DIAGNOSIS — E860 Dehydration: Secondary | ICD-10-CM | POA: Diagnosis present

## 2010-06-28 DIAGNOSIS — Z930 Tracheostomy status: Secondary | ICD-10-CM

## 2010-06-28 DIAGNOSIS — J9610 Chronic respiratory failure, unspecified whether with hypoxia or hypercapnia: Secondary | ICD-10-CM | POA: Diagnosis present

## 2010-06-28 DIAGNOSIS — N319 Neuromuscular dysfunction of bladder, unspecified: Secondary | ICD-10-CM | POA: Diagnosis present

## 2010-06-28 DIAGNOSIS — K5900 Constipation, unspecified: Secondary | ICD-10-CM | POA: Diagnosis present

## 2010-06-28 LAB — CBC
HCT: 32.7 % — ABNORMAL LOW (ref 36.0–46.0)
Hemoglobin: 9.7 g/dL — ABNORMAL LOW (ref 12.0–15.0)
RDW: 14.1 % (ref 11.5–15.5)
WBC: 12.4 10*3/uL — ABNORMAL HIGH (ref 4.0–10.5)

## 2010-06-28 LAB — DIFFERENTIAL
Basophils Absolute: 0 10*3/uL (ref 0.0–0.1)
Lymphocytes Relative: 8 % — ABNORMAL LOW (ref 12–46)
Neutro Abs: 10.4 10*3/uL — ABNORMAL HIGH (ref 1.7–7.7)
Neutrophils Relative %: 84 % — ABNORMAL HIGH (ref 43–77)

## 2010-06-28 LAB — URINALYSIS, ROUTINE W REFLEX MICROSCOPIC
Specific Gravity, Urine: 1.013 (ref 1.005–1.030)
Urobilinogen, UA: 0.2 mg/dL (ref 0.0–1.0)

## 2010-06-28 LAB — BASIC METABOLIC PANEL
CO2: 27 mEq/L (ref 19–32)
GFR calc non Af Amer: 49 mL/min — ABNORMAL LOW (ref >60)
Glucose, Bld: 120 mg/dL — ABNORMAL HIGH (ref 70–99)
Potassium: 5 mEq/L (ref 3.5–5.1)
Sodium: 133 mEq/L — ABNORMAL LOW (ref 135–145)

## 2010-06-28 LAB — URINE MICROSCOPIC-ADD ON

## 2010-06-29 ENCOUNTER — Inpatient Hospital Stay (HOSPITAL_COMMUNITY): Payer: Medicare Other

## 2010-06-29 LAB — HEMOGLOBIN A1C
Hgb A1c MFr Bld: 5.6 % (ref <5.7)
Mean Plasma Glucose: 114 mg/dL (ref <117)

## 2010-06-29 LAB — CBC
Hemoglobin: 8.1 g/dL — ABNORMAL LOW (ref 12.0–15.0)
MCH: 22.5 pg — ABNORMAL LOW (ref 26.0–34.0)
RBC: 3.6 MIL/uL — ABNORMAL LOW (ref 3.87–5.11)
WBC: 5.5 10*3/uL (ref 4.0–10.5)

## 2010-06-29 LAB — BASIC METABOLIC PANEL
CO2: 29 mEq/L (ref 19–32)
Chloride: 103 mEq/L (ref 96–112)
GFR calc Af Amer: >60 mL/min (ref >60)
Potassium: 5.1 mEq/L (ref 3.5–5.1)
Sodium: 136 mEq/L (ref 135–145)

## 2010-06-29 LAB — URINE CULTURE
Colony Count: >=100000
Culture  Setup Time: 201205161023

## 2010-06-30 LAB — CBC
HCT: 29.5 % — ABNORMAL LOW (ref 36.0–46.0)
MCH: 23.1 pg — ABNORMAL LOW (ref 26.0–34.0)
MCHC: 29.2 g/dL — ABNORMAL LOW (ref 30.0–36.0)
MCV: 79.3 fL (ref 78.0–100.0)
Platelets: 137 10*3/uL — ABNORMAL LOW (ref 150–400)
RDW: 14.5 % (ref 11.5–15.5)
WBC: 6.8 10*3/uL (ref 4.0–10.5)

## 2010-07-01 LAB — BASIC METABOLIC PANEL
BUN: 23 mg/dL (ref 6–23)
Creatinine, Ser: 1.25 mg/dL — ABNORMAL HIGH (ref 0.4–1.2)
GFR calc non Af Amer: 46 mL/min — ABNORMAL LOW (ref >60)
Glucose, Bld: 106 mg/dL — ABNORMAL HIGH (ref 70–99)
Potassium: 4.5 mEq/L (ref 3.5–5.1)

## 2010-07-04 LAB — CULTURE, BLOOD (ROUTINE X 2)
Culture  Setup Time: 201205160830
Culture: NO GROWTH
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
## 2010-08-17 ENCOUNTER — Emergency Department (HOSPITAL_COMMUNITY): Payer: Medicare Other

## 2010-08-17 ENCOUNTER — Emergency Department (HOSPITAL_COMMUNITY)
Admission: EM | Admit: 2010-08-17 | Discharge: 2010-08-18 | Disposition: A | Discharge status: Home / Self Care | Payer: Medicare Other | Attending: Emergency Medicine | Admitting: Emergency Medicine

## 2010-08-17 DIAGNOSIS — R0602 Shortness of breath: Secondary | ICD-10-CM | POA: Insufficient documentation

## 2010-08-17 DIAGNOSIS — R05 Cough: Secondary | ICD-10-CM | POA: Insufficient documentation

## 2010-08-17 DIAGNOSIS — F341 Dysthymic disorder: Secondary | ICD-10-CM | POA: Insufficient documentation

## 2010-08-17 DIAGNOSIS — N319 Neuromuscular dysfunction of bladder, unspecified: Secondary | ICD-10-CM | POA: Insufficient documentation

## 2010-08-17 DIAGNOSIS — N390 Urinary tract infection, site not specified: Secondary | ICD-10-CM | POA: Insufficient documentation

## 2010-08-17 DIAGNOSIS — Z930 Tracheostomy status: Secondary | ICD-10-CM | POA: Insufficient documentation

## 2010-08-17 DIAGNOSIS — R059 Cough, unspecified: Secondary | ICD-10-CM | POA: Insufficient documentation

## 2010-08-17 DIAGNOSIS — G8250 Quadriplegia, unspecified: Secondary | ICD-10-CM | POA: Insufficient documentation

## 2010-08-17 LAB — URINE MICROSCOPIC-ADD ON

## 2010-08-17 LAB — BASIC METABOLIC PANEL
Calcium: 9.4 mg/dL (ref 8.4–10.5)
Creatinine, Ser: 1.18 mg/dL — ABNORMAL HIGH (ref 0.50–1.10)
GFR calc Af Amer: 60 mL/min — ABNORMAL LOW (ref >60)
Sodium: 137 mEq/L (ref 135–145)

## 2010-08-17 LAB — URINALYSIS, ROUTINE W REFLEX MICROSCOPIC
Ketones, ur: NEGATIVE mg/dL
Nitrite: POSITIVE — AB
Protein, ur: 30 mg/dL — AB
Urobilinogen, UA: 0.2 mg/dL (ref 0.0–1.0)

## 2010-08-17 LAB — CBC
MCH: 22.9 pg — ABNORMAL LOW (ref 26.0–34.0)
MCV: 78.4 fL (ref 78.0–100.0)
Platelets: 202 10*3/uL (ref 150–400)
RDW: 12.9 % (ref 11.5–15.5)
WBC: 10.1 10*3/uL (ref 4.0–10.5)

## 2010-08-17 LAB — DIFFERENTIAL
Basophils Relative: 0 % (ref 0–1)
Eosinophils Absolute: 0.3 10*3/uL (ref 0.0–0.7)
Eosinophils Relative: 3 % (ref 0–5)
Lymphs Abs: 3.3 10*3/uL (ref 0.7–4.0)
Monocytes Relative: 6 % (ref 3–12)
Neutrophils Relative %: 58 % (ref 43–77)

## 2010-08-21 LAB — URINE CULTURE
Colony Count: >=100000
Culture  Setup Time: 201207061150

## 2010-11-01 ENCOUNTER — Inpatient Hospital Stay (HOSPITAL_COMMUNITY)
Admission: EM | Admit: 2010-11-01 | Discharge: 2010-11-08 | DRG: 871 | Disposition: A | Discharge status: Skilled Nursing Facility | Payer: Medicare Other | Attending: Pulmonary Disease | Admitting: Pulmonary Disease

## 2010-11-01 ENCOUNTER — Emergency Department (HOSPITAL_COMMUNITY): Payer: Medicare Other

## 2010-11-01 DIAGNOSIS — J209 Acute bronchitis, unspecified: Secondary | ICD-10-CM | POA: Diagnosis present

## 2010-11-01 DIAGNOSIS — R6520 Severe sepsis without septic shock: Secondary | ICD-10-CM | POA: Diagnosis present

## 2010-11-01 DIAGNOSIS — N390 Urinary tract infection, site not specified: Secondary | ICD-10-CM | POA: Diagnosis present

## 2010-11-01 DIAGNOSIS — F341 Dysthymic disorder: Secondary | ICD-10-CM | POA: Diagnosis present

## 2010-11-01 DIAGNOSIS — G8250 Quadriplegia, unspecified: Secondary | ICD-10-CM | POA: Diagnosis present

## 2010-11-01 DIAGNOSIS — F259 Schizoaffective disorder, unspecified: Secondary | ICD-10-CM | POA: Diagnosis present

## 2010-11-01 DIAGNOSIS — K5900 Constipation, unspecified: Secondary | ICD-10-CM | POA: Diagnosis present

## 2010-11-01 DIAGNOSIS — Z91040 Latex allergy status: Secondary | ICD-10-CM

## 2010-11-01 DIAGNOSIS — N179 Acute kidney failure, unspecified: Secondary | ICD-10-CM | POA: Diagnosis present

## 2010-11-01 DIAGNOSIS — Z931 Gastrostomy status: Secondary | ICD-10-CM

## 2010-11-01 DIAGNOSIS — N319 Neuromuscular dysfunction of bladder, unspecified: Secondary | ICD-10-CM | POA: Diagnosis present

## 2010-11-01 DIAGNOSIS — J189 Pneumonia, unspecified organism: Secondary | ICD-10-CM | POA: Diagnosis present

## 2010-11-01 DIAGNOSIS — Z79899 Other long term (current) drug therapy: Secondary | ICD-10-CM

## 2010-11-01 DIAGNOSIS — E2749 Other adrenocortical insufficiency: Secondary | ICD-10-CM | POA: Diagnosis present

## 2010-11-01 DIAGNOSIS — Z930 Tracheostomy status: Secondary | ICD-10-CM

## 2010-11-01 DIAGNOSIS — J9610 Chronic respiratory failure, unspecified whether with hypoxia or hypercapnia: Secondary | ICD-10-CM | POA: Diagnosis present

## 2010-11-01 DIAGNOSIS — A419 Sepsis, unspecified organism: Principal | ICD-10-CM | POA: Diagnosis present

## 2010-11-01 LAB — CBC
HCT: 33.1 % — ABNORMAL LOW (ref 36.0–46.0)
Hemoglobin: 10.2 g/dL — ABNORMAL LOW (ref 12.0–15.0)
MCHC: 30.8 g/dL (ref 30.0–36.0)
MCV: 76.8 fL — ABNORMAL LOW (ref 78.0–100.0)

## 2010-11-01 LAB — COMPREHENSIVE METABOLIC PANEL
Alkaline Phosphatase: 80 U/L (ref 39–117)
BUN: 36 mg/dL — ABNORMAL HIGH (ref 6–23)
GFR calc Af Amer: 58 mL/min — ABNORMAL LOW (ref >60)
Glucose, Bld: 100 mg/dL — ABNORMAL HIGH (ref 70–99)
Potassium: 4.6 mEq/L (ref 3.5–5.1)
Total Bilirubin: 0.2 mg/dL — ABNORMAL LOW (ref 0.3–1.2)
Total Protein: 7.9 g/dL (ref 6.0–8.3)

## 2010-11-01 LAB — DIFFERENTIAL
Basophils Relative: 0 % (ref 0–1)
Eosinophils Relative: 0 % (ref 0–5)
Lymphs Abs: 2.6 10*3/uL (ref 0.7–4.0)
Monocytes Absolute: 2.1 10*3/uL — ABNORMAL HIGH (ref 0.1–1.0)
Neutro Abs: 24.6 10*3/uL — ABNORMAL HIGH (ref 1.7–7.7)
Neutrophils Relative %: 84 % — ABNORMAL HIGH (ref 43–77)

## 2010-11-01 LAB — URINALYSIS, ROUTINE W REFLEX MICROSCOPIC
Bilirubin Urine: NEGATIVE
Ketones, ur: NEGATIVE mg/dL
Nitrite: POSITIVE — AB
Urobilinogen, UA: 0.2 mg/dL (ref 0.0–1.0)

## 2010-11-01 LAB — GRAM STAIN

## 2010-11-01 LAB — CK TOTAL AND CKMB (NOT AT ARMC): Total CK: 85 U/L (ref 7–177)

## 2010-11-01 LAB — PROCALCITONIN: Procalcitonin: 3.18 ng/mL

## 2010-11-02 ENCOUNTER — Inpatient Hospital Stay (HOSPITAL_COMMUNITY): Payer: Medicare Other

## 2010-11-02 DIAGNOSIS — N390 Urinary tract infection, site not specified: Secondary | ICD-10-CM

## 2010-11-02 DIAGNOSIS — E861 Hypovolemia: Secondary | ICD-10-CM

## 2010-11-02 DIAGNOSIS — J9610 Chronic respiratory failure, unspecified whether with hypoxia or hypercapnia: Secondary | ICD-10-CM

## 2010-11-02 DIAGNOSIS — A419 Sepsis, unspecified organism: Secondary | ICD-10-CM

## 2010-11-02 DIAGNOSIS — R6520 Severe sepsis without septic shock: Secondary | ICD-10-CM

## 2010-11-02 DIAGNOSIS — R6521 Severe sepsis with septic shock: Secondary | ICD-10-CM

## 2010-11-02 LAB — DIFFERENTIAL
Basophils Relative: 0 % (ref 0–1)
Eosinophils Absolute: 0.3 10*3/uL (ref 0.0–0.7)
Eosinophils Relative: 1 % (ref 0–5)
Lymphs Abs: 3.5 10*3/uL (ref 0.7–4.0)
Monocytes Absolute: 1.3 10*3/uL — ABNORMAL HIGH (ref 0.1–1.0)
Monocytes Relative: 6 % (ref 3–12)

## 2010-11-02 LAB — POCT I-STAT 3, ART BLOOD GAS (G3+)
Acid-Base Excess: 2 mmol/L (ref 0.0–2.0)
Bicarbonate: 26.9 mEq/L — ABNORMAL HIGH (ref 20.0–24.0)
Patient temperature: 98.7
Patient temperature: 98.5
TCO2: 26 mmol/L (ref 0–100)
TCO2: 28 mmol/L (ref 0–100)
pCO2 arterial: 48.6 mmHg — ABNORMAL HIGH (ref 35.0–45.0)
pH, Arterial: 7.311 — ABNORMAL LOW (ref 7.350–7.400)

## 2010-11-02 LAB — URINE CULTURE
Colony Count: >=100000
Culture  Setup Time: 201209191717

## 2010-11-02 LAB — COMPREHENSIVE METABOLIC PANEL
ALT: 14 U/L (ref 0–35)
AST: 15 U/L (ref 0–37)
Albumin: 2.7 g/dL — ABNORMAL LOW (ref 3.5–5.2)
CO2: 25 mEq/L (ref 19–32)
Calcium: 8.7 mg/dL (ref 8.4–10.5)
Chloride: 108 mEq/L (ref 96–112)
Creatinine, Ser: 1.08 mg/dL (ref 0.50–1.10)
GFR calc non Af Amer: 55 mL/min — ABNORMAL LOW (ref >60)
Sodium: 142 mEq/L (ref 135–145)
Total Bilirubin: 0.2 mg/dL — ABNORMAL LOW (ref 0.3–1.2)

## 2010-11-02 LAB — CARBOXYHEMOGLOBIN
O2 Saturation: 82.6 %
Total hemoglobin: 8.7 g/dL — ABNORMAL LOW (ref 12.5–16.0)

## 2010-11-02 LAB — CBC
MCH: 23.8 pg — ABNORMAL LOW (ref 26.0–34.0)
MCHC: 30.8 g/dL (ref 30.0–36.0)
MCV: 77.2 fL — ABNORMAL LOW (ref 78.0–100.0)
Platelets: 171 10*3/uL (ref 150–400)

## 2010-11-02 LAB — CARDIAC PANEL(CRET KIN+CKTOT+MB+TROPI)
Relative Index: INVALID (ref 0.0–2.5)
Relative Index: INVALID (ref 0.0–2.5)
Troponin I: <0.3 ng/mL (ref <0.30)

## 2010-11-02 LAB — APTT: aPTT: 38 seconds — ABNORMAL HIGH (ref 24–37)

## 2010-11-02 LAB — TYPE AND SCREEN: Antibody Screen: NEGATIVE

## 2010-11-02 LAB — LACTIC ACID, PLASMA: Lactic Acid, Venous: 0.6 mmol/L (ref 0.5–2.2)

## 2010-11-02 LAB — D-DIMER, QUANTITATIVE: D-Dimer, Quant: 1.85 ug/mL-FEU — ABNORMAL HIGH (ref 0.00–0.48)

## 2010-11-03 ENCOUNTER — Inpatient Hospital Stay (HOSPITAL_COMMUNITY): Payer: Medicare Other

## 2010-11-03 LAB — URINALYSIS, ROUTINE W REFLEX MICROSCOPIC
Bilirubin Urine: NEGATIVE
Ketones, ur: 15 mg/dL — AB
Nitrite: NEGATIVE
Protein, ur: 30 mg/dL — AB
Urobilinogen, UA: 0.2 mg/dL (ref 0.0–1.0)
pH: 7 (ref 5.0–8.0)

## 2010-11-03 LAB — COMPREHENSIVE METABOLIC PANEL
Alkaline Phosphatase: 69 U/L (ref 39–117)
BUN: 21 mg/dL (ref 6–23)
Chloride: 107 mEq/L (ref 96–112)
Creatinine, Ser: 0.98 mg/dL (ref 0.50–1.10)
GFR calc Af Amer: >60 mL/min (ref >60)
Glucose, Bld: 100 mg/dL — ABNORMAL HIGH (ref 70–99)
Potassium: 4.4 mEq/L (ref 3.5–5.1)
Total Bilirubin: 0.2 mg/dL — ABNORMAL LOW (ref 0.3–1.2)
Total Protein: 6.7 g/dL (ref 6.0–8.3)

## 2010-11-03 LAB — GLUCOSE, CAPILLARY: Glucose-Capillary: 120 mg/dL — ABNORMAL HIGH (ref 70–99)

## 2010-11-03 LAB — URINE MICROSCOPIC-ADD ON

## 2010-11-03 LAB — CBC
HCT: 27.6 % — ABNORMAL LOW (ref 36.0–46.0)
Hemoglobin: 8.3 g/dL — ABNORMAL LOW (ref 12.0–15.0)
MCHC: 30.1 g/dL (ref 30.0–36.0)
MCV: 77.1 fL — ABNORMAL LOW (ref 78.0–100.0)

## 2010-11-03 LAB — BLOOD GAS, ARTERIAL
Acid-base deficit: 0.2 mmol/L (ref 0.0–2.0)
Bicarbonate: 25.6 mEq/L — ABNORMAL HIGH (ref 20.0–24.0)
O2 Saturation: 99.1 %
Patient temperature: 98.6
TCO2: 27.3 mmol/L (ref 0–100)
pO2, Arterial: 135 mmHg — ABNORMAL HIGH (ref 80.0–100.0)

## 2010-11-03 LAB — DIFFERENTIAL
Basophils Absolute: 0 10*3/uL (ref 0.0–0.1)
Lymphocytes Relative: 24 % (ref 12–46)
Lymphs Abs: 3 10*3/uL (ref 0.7–4.0)
Monocytes Absolute: 0.8 10*3/uL (ref 0.1–1.0)
Neutro Abs: 8.1 10*3/uL — ABNORMAL HIGH (ref 1.7–7.7)

## 2010-11-03 LAB — URIC ACID: Uric Acid, Serum: 7.1 mg/dL — ABNORMAL HIGH (ref 2.4–7.0)

## 2010-11-03 LAB — LACTIC ACID, PLASMA: Lactic Acid, Venous: 0.9 mmol/L (ref 0.5–2.2)

## 2010-11-03 LAB — CORTISOL: Cortisol, Plasma: 19 ug/dL

## 2010-11-03 LAB — OSMOLALITY, URINE: Osmolality, Ur: 353 mOsm/kg — ABNORMAL LOW (ref 390–1090)

## 2010-11-03 LAB — PROCALCITONIN: Procalcitonin: 2.21 ng/mL

## 2010-11-04 ENCOUNTER — Inpatient Hospital Stay (HOSPITAL_COMMUNITY): Payer: Medicare Other

## 2010-11-04 LAB — CBC
Hemoglobin: 8.1 g/dL — ABNORMAL LOW (ref 12.0–15.0)
MCHC: 30.7 g/dL (ref 30.0–36.0)
RDW: 13.6 % (ref 11.5–15.5)
WBC: 12.6 10*3/uL — ABNORMAL HIGH (ref 4.0–10.5)

## 2010-11-04 LAB — COMPREHENSIVE METABOLIC PANEL
ALT: 10 U/L (ref 0–35)
Albumin: 2.4 g/dL — ABNORMAL LOW (ref 3.5–5.2)
Alkaline Phosphatase: 71 U/L (ref 39–117)
Potassium: 4 mEq/L (ref 3.5–5.1)
Sodium: 144 mEq/L (ref 135–145)
Total Protein: 7 g/dL (ref 6.0–8.3)

## 2010-11-04 LAB — URINALYSIS, ROUTINE W REFLEX MICROSCOPIC
Bilirubin Urine: NEGATIVE
Glucose, UA: NEGATIVE mg/dL
Specific Gravity, Urine: 1.012 (ref 1.005–1.030)
pH: 7 (ref 5.0–8.0)

## 2010-11-04 LAB — POCT I-STAT 3, ART BLOOD GAS (G3+)
Acid-base deficit: 1 mmol/L (ref 0.0–2.0)
pH, Arterial: 7.352 (ref 7.350–7.400)
pO2, Arterial: 117 mmHg — ABNORMAL HIGH (ref 80.0–100.0)

## 2010-11-04 LAB — URINE MICROSCOPIC-ADD ON

## 2010-11-04 LAB — CULTURE, RESPIRATORY W GRAM STAIN

## 2010-11-04 LAB — GLUCOSE, CAPILLARY: Glucose-Capillary: 135 mg/dL — ABNORMAL HIGH (ref 70–99)

## 2010-11-05 ENCOUNTER — Inpatient Hospital Stay (HOSPITAL_COMMUNITY): Payer: Medicare Other

## 2010-11-05 DIAGNOSIS — A419 Sepsis, unspecified organism: Secondary | ICD-10-CM

## 2010-11-05 DIAGNOSIS — N390 Urinary tract infection, site not specified: Secondary | ICD-10-CM

## 2010-11-05 DIAGNOSIS — J9620 Acute and chronic respiratory failure, unspecified whether with hypoxia or hypercapnia: Secondary | ICD-10-CM

## 2010-11-05 LAB — BASIC METABOLIC PANEL
Chloride: 107 mEq/L (ref 96–112)
Creatinine, Ser: 0.82 mg/dL (ref 0.50–1.10)
GFR calc Af Amer: >60 mL/min (ref >60)
Potassium: 3.9 mEq/L (ref 3.5–5.1)

## 2010-11-05 LAB — CBC
MCV: 76.1 fL — ABNORMAL LOW (ref 78.0–100.0)
Platelets: 162 10*3/uL (ref 150–400)
RDW: 13.6 % (ref 11.5–15.5)
WBC: 8.6 10*3/uL (ref 4.0–10.5)

## 2010-11-06 DIAGNOSIS — J9620 Acute and chronic respiratory failure, unspecified whether with hypoxia or hypercapnia: Secondary | ICD-10-CM

## 2010-11-06 DIAGNOSIS — N390 Urinary tract infection, site not specified: Secondary | ICD-10-CM

## 2010-11-06 LAB — URINE CULTURE: Culture: NO GROWTH

## 2010-11-08 LAB — CBC
HCT: 29.1 % — ABNORMAL LOW (ref 36.0–46.0)
Hemoglobin: 8.6 g/dL — ABNORMAL LOW (ref 12.0–15.0)
MCH: 23.1 pg — ABNORMAL LOW (ref 26.0–34.0)
MCHC: 29.6 g/dL — ABNORMAL LOW (ref 30.0–36.0)
MCV: 78 fL (ref 78.0–100.0)

## 2010-11-08 LAB — CULTURE, BLOOD (ROUTINE X 2)
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
## 2010-11-09 NOTE — Discharge Summary (Signed)
  NAMELEAH, THORNBERRY NO.:  [id number]  MEDICAL RECORD NO.:  [id number]  LOCATION:  2602                         FACILITY:  [facility]  PHYSICIAN:  Canary Brim, NP       DATE OF BIRTH:  [birth year]  DATE OF ADMISSION:  11/01/2010 DATE OF DISCHARGE:                              DISCHARGE SUMMARY   ADDENDUM:  On day of discharge, Ms. Beane was evaluated by Speech Language for Passy-Muir valve use with a #6 cuffless tracheostomy in place.  She did demonstrate evidence of air trapping and only tolerated Passy-Muir valve with short periods and will need to continue to work with speech language at skilled nursing facility for Passy-Muir valve use.  She should not use Passy-Muir valve without full supervision.  A #4 cuffless was placed, however, this did not change tolerance of Passy- Muir valve.  A #6 was placed for discharge.  She will continue at time of discharge with a #6 cuffless tracheostomy.  Again it is recommended that she continue to follow up with Speech Language Pathology at skilled nursing facility for Passy-Muir valve use.  Decreased tolerance was thought likely secondary to body habitus and excess adipose tissue in the neck as well as positioning.     Canary Brim, NP     BO/MEDQ  D:  11/08/2010  T:  11/08/2010  Job:  [id number]  cc:   Skilled Nursing Facility  Electronically Signed by Canary Brim  on 11/09/2010 04:53:17 PM

## 2010-11-10 LAB — CULTURE, BLOOD (ROUTINE X 2)
Culture  Setup Time: 201209220005
Culture: NO GROWTH
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
## 2011-01-26 IMAGING — CR DG ABD PORTABLE 2V
1 series · 3 of 3 positions shown · non-contrast
Comparison: 12/29/2009

CLINICAL DATA: Fever.  Abdominal pain.

ABDOMEN - 2 VIEW

[Series 1: ap (kub) · U · 3 of 3 slices shown]
[im 1/3]
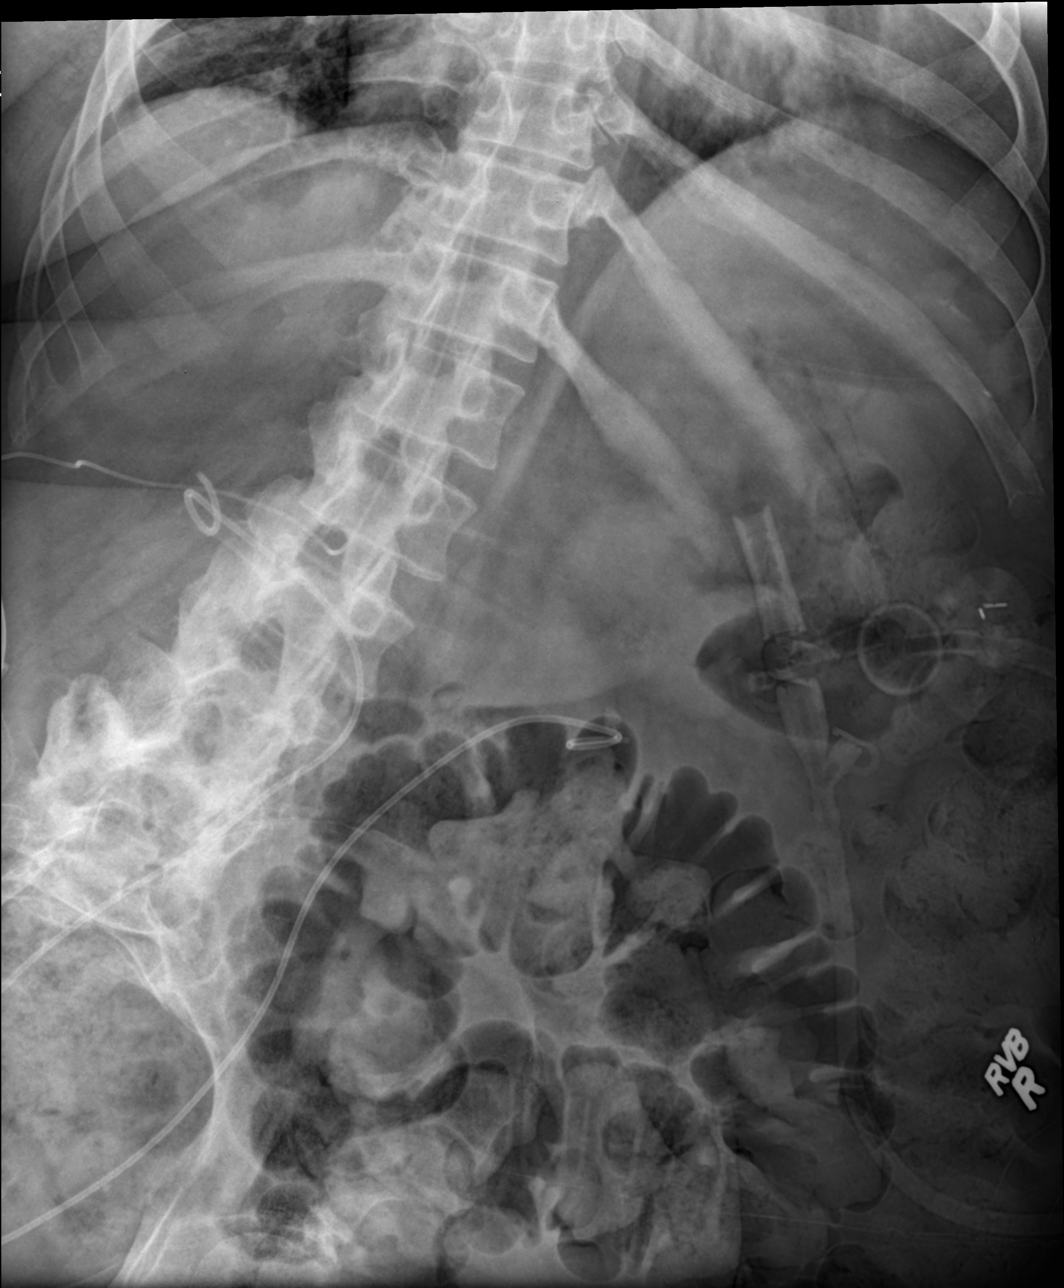
[im 2/3]
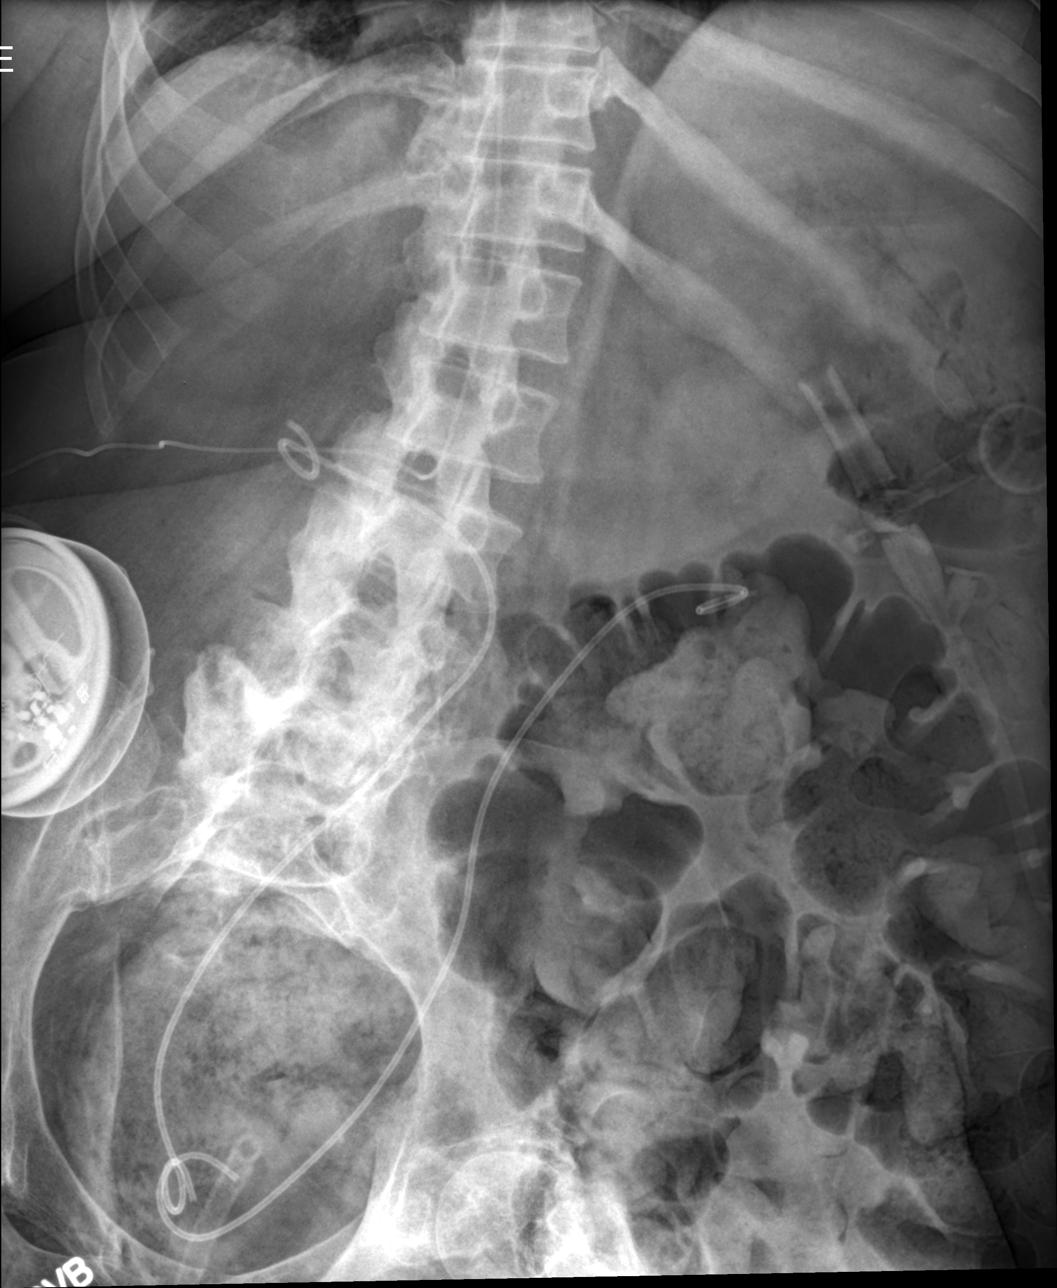
[im 3/3]
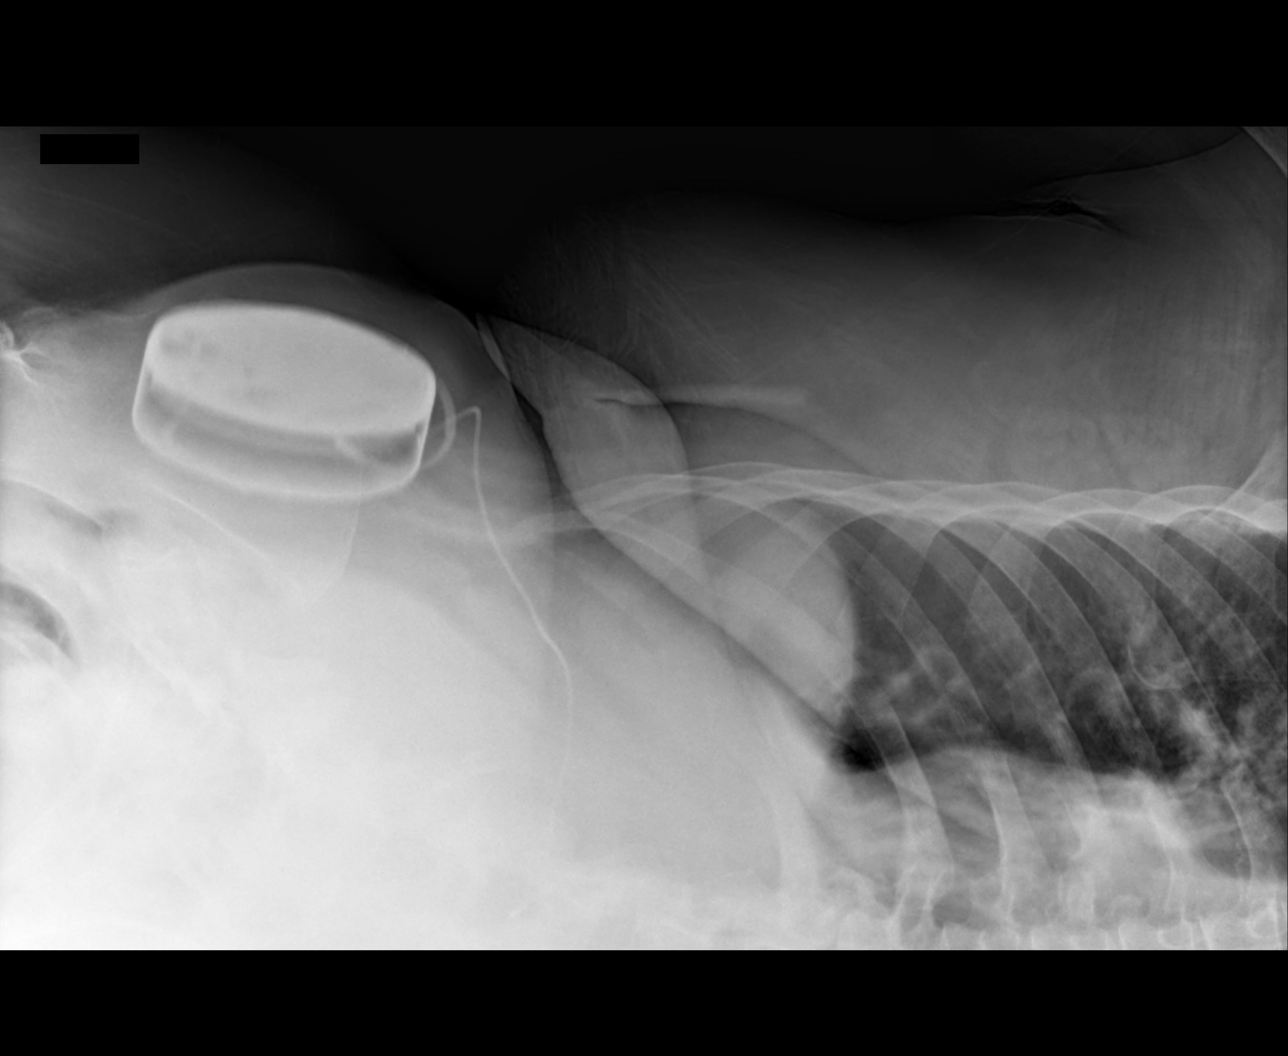

[3 of 3 positions shown; findings below may reference images not displayed]

FINDINGS: Peg tube noted.  Bilateral nephrostomy tubes are present.

Prominence of stool throughout the colon suggests constipation.
The patient appears contracted and scoliotic, and a spinal catheter
is in place along with tubing projecting over the pelvis possibly
representing a urinary tube.

Prominent lower lumbar spondylosis is present.

No free peritoneal gas is noted.

No dilated small bowel is noted.  No significant abnormal air-fluid
levels.
IMPRESSION: 1. Prominence of stool throughout the colon suggests constipation.
2.  Stable appearance of the lumbar spine and visualized pelvis.
3.  Bilateral nephrostomy tubes remain in place.  A peg tube is in
place.
4.  No findings of free peritoneal gas, abnormal air-fluid levels,
or dilated small bowel.

## 2011-04-11 ENCOUNTER — Emergency Department (HOSPITAL_COMMUNITY)
Admission: EM | Admit: 2011-04-11 | Discharge: 2011-04-11 | Disposition: A | Discharge status: Skilled Nursing Facility | Payer: Medicare Other | Attending: Emergency Medicine | Admitting: Emergency Medicine

## 2011-04-11 ENCOUNTER — Encounter (HOSPITAL_COMMUNITY): Payer: Self-pay | Admitting: Emergency Medicine

## 2011-04-11 DIAGNOSIS — E860 Dehydration: Secondary | ICD-10-CM | POA: Insufficient documentation

## 2011-04-11 DIAGNOSIS — Z930 Tracheostomy status: Secondary | ICD-10-CM | POA: Insufficient documentation

## 2011-04-11 DIAGNOSIS — Z79899 Other long term (current) drug therapy: Secondary | ICD-10-CM | POA: Insufficient documentation

## 2011-04-11 DIAGNOSIS — D649 Anemia, unspecified: Secondary | ICD-10-CM | POA: Insufficient documentation

## 2011-04-11 DIAGNOSIS — G8250 Quadriplegia, unspecified: Secondary | ICD-10-CM | POA: Insufficient documentation

## 2011-04-11 DIAGNOSIS — N390 Urinary tract infection, site not specified: Secondary | ICD-10-CM

## 2011-04-11 DIAGNOSIS — M24549 Contracture, unspecified hand: Secondary | ICD-10-CM | POA: Insufficient documentation

## 2011-04-11 DIAGNOSIS — F209 Schizophrenia, unspecified: Secondary | ICD-10-CM | POA: Insufficient documentation

## 2011-04-11 HISTORY — DX: Respiratory failure, unspecified, unspecified whether with hypoxia or hypercapnia: J96.90

## 2011-04-11 HISTORY — DX: Quadriplegia, unspecified: G82.50

## 2011-04-11 HISTORY — DX: Contracture, unspecified hand: M24.549

## 2011-04-11 HISTORY — DX: Anemia, unspecified: D64.9

## 2011-04-11 HISTORY — DX: Anxiety disorder, unspecified: F41.9

## 2011-04-11 HISTORY — DX: Schizophrenia, unspecified: F20.9

## 2011-04-11 HISTORY — DX: Neuromuscular dysfunction of bladder, unspecified: N31.9

## 2011-04-11 LAB — URINALYSIS, ROUTINE W REFLEX MICROSCOPIC
Nitrite: POSITIVE — AB
Protein, ur: 30 mg/dL — AB
Specific Gravity, Urine: 1.011 (ref 1.005–1.030)
Urobilinogen, UA: 0.2 mg/dL (ref 0.0–1.0)

## 2011-04-11 LAB — POCT I-STAT, CHEM 8
BUN: 33 mg/dL — ABNORMAL HIGH (ref 6–23)
Calcium, Ion: 1.17 mmol/L (ref 1.12–1.32)
Chloride: 105 mEq/L (ref 96–112)
Glucose, Bld: 141 mg/dL — ABNORMAL HIGH (ref 70–99)
HCT: 36 % (ref 36.0–46.0)
Potassium: 4.5 mEq/L (ref 3.5–5.1)

## 2011-04-11 LAB — URINE MICROSCOPIC-ADD ON

## 2011-04-11 MED ORDER — CIPROFLOXACIN HCL 500 MG PO TABS
500.0000 mg | ORAL_TABLET | Freq: Two times a day (BID) | ORAL | Status: DC
  Administered 2011-04-11: 500 mg via ORAL
  Filled 2011-04-11: qty 1

## 2011-04-11 MED ORDER — CIPROFLOXACIN HCL 500 MG PO TABS: 500.0000 mg | ORAL_TABLET | Freq: Two times a day (BID) | ORAL | Status: AC

## 2011-04-11 MED ORDER — OXYCODONE-ACETAMINOPHEN 5-325 MG PO TABS
2.0000 | ORAL_TABLET | Freq: Once | ORAL | Status: AC
  Administered 2011-04-11: 2 via ORAL
  Filled 2011-04-11: qty 2

## 2011-04-11 NOTE — ED Notes (Signed)
Pt states she has a port-a-cath if needed.

## 2011-04-11 NOTE — ED Notes (Signed)
Urine sample pulled off of cath.

## 2011-04-11 NOTE — Discharge Instructions (Signed)
Dehydration Dehydration is the reduction of water and fluid from the body to a level below that required for proper functioning. CAUSES  Dehydration occurs when there is excessive fluid loss from the body or when loss of normal fluids is not adequately replaced.  Loss of fluids occurs in vomiting, diarrhea, excessive sweating, excessive urine output, or excessive loss of fluid from the lungs (as occurs in fever or in patients on a ventilator).   Inadequate fluid replacement occurs with nausea or decreased appetite due to illness, sore throat, or mouth pain.  SYMPTOMS  Mild dehydration  Thirst (infants and young children may not be able to tell you they are thirsty).   Dry lips.   Slightly dry mouth membranes.  Moderate dehydration  Very dry mouth membranes.   Sunken eyes.   Sunken soft spot (fontanelle) on infant's head.   Skin does not bounce back quickly when lightly pinched and released.   Decreased urine production.   Decreased tear production.  Severe dehydration  Rapid, weak pulse (more than 100 beats per minute at rest).   Cold hands and feet.   Loss of ability to sweat in spite of heat and temperature.   Rapid breathing.   Blue lips.   Confusion, lethargy, difficult to arouse.   Minimal urine production.   No tears.  DIAGNOSIS  Your caregiver will diagnose dehydration based on your symptoms and your exam. Blood and urine tests will help confirm the diagnosis. The diagnostic evaluation should also identify the cause of dehydration. PREVENTION  The body depends on a proper balance of fluid and salts (electrolytes) for normal function. Adequate fluid intake in the presence of illness or other stresses (such as extreme exercise) is important.  TREATMENT   Mild dehydration is safe to self-treat for most ages as long as it does not worsen. Contact your caregiver for even mild dehydration in infants and the elderly.   In teenagers and adults with moderate  dehydration, careful home treatment (as outlined below) can be safe. Phone contact with a caregiver is advised. Children under 53 years of age with moderate dehydration should see a caregiver.   If you or your child is severely dehydrated, go to a hospital for treatment. Intravenous (IV) fluids will quickly reverse dehydration and are often lifesaving in young children, infants, and elderly persons.  HOME CARE INSTRUCTIONS  Small amounts of fluids should be taken frequently. Large amounts at one time may not be tolerated. Plain water may be harmful in infants and the elderly. Oral rehydration solutions (ORS) are available at pharmacies and grocery stores. ORS replaces water and important electrolytes in proper proportions. Sports drinks are not as effective as ORS and may be harmful because the sugar can make diarrhea worse.  As a general guideline for children, replace any new fluid losses from diarrhea and/or vomiting with ORS as follows:   If your child weighs 22 pounds or under (10 kg or less), give 60-120 mL (1/4-1/2 cup or 2-4 ounces) of ORS for each diarrheal stool or vomiting episode.   If your child weighs more than 22 pounds (more than 10 kg), give 120-240 mL (1/2-1 cup or 4-8 ounces) of ORS for each diarrheal stool or vomiting episode.   If your child is vomiting, it may be helpful to give the above ORS replacement in 5 mL (1 teaspoon) amounts every 5 minutes and increase as tolerated.   While correcting for dehydration, children should eat normally. However, foods high in sugar should be  avoided because they may worsen diarrhea. Large amounts of carbonated soft drinks, juice, gelatin desserts, and other highly sugared drinks should be avoided.   After correction of dehydration, other liquids that are appealing to the child may be added. Children should drink small amounts of fluids frequently and fluids should be increased as tolerated. Children should drink enough fluids to keep urine  clear or pale yellow.   Adults should eat normally while drinking more fluids than usual. Drink small amounts of fluids frequently and increase the amount as tolerated. Drink enough fluids to keep urine clear or pale yellow. Broths, weak decaffeinated tea, lemon-lime soft drinks (allowed to go flat), and ORS replace fluids and electrolytes.   Avoid:   Carbonated drinks.   Juice.   Extremely hot or cold fluids.   Caffeine drinks.   Fatty, greasy foods.   Alcohol.   Tobacco.   Too much intake of anything at one time.   Gelatin desserts.   Probiotics are active cultures of beneficial bacteria. They may lessen the amount and number of diarrheal stools in adults. Probiotics can be found in yogurt with active cultures and in supplements.   Wash your hands well to avoid spreading germs (bacteria) and viruses.   Antidiarrheal medicines are not recommended for infants and children.   Only take over-the-counter or prescription medicines for pain, discomfort, or fever as directed by your caregiver. Do not give aspirin to children.   For adults with dehydration, ask your caregiver if you should continue all prescribed and over-the-counter medicines.   If your caregiver has given you a follow-up appointment, it is very important to keep that appointment. Not keeping the appointment could result in a lasting (chronic) or permanent injury and disability. If there is any problem keeping the appointment, you must call to reschedule.  SEEK IMMEDIATE MEDICAL CARE IF:   You are unable to keep fluids down or other symptoms become worse despite treatment.   Vomiting or diarrhea develops and becomes persistent.   There is vomiting of blood or green matter (bile).   There is blood in the stool or the stools are black and tarry.   There is no urine output in 6 to 8 hours or there is only a small amount of very dark urine.   Abdominal pain develops, increases, or localizes.   You or your child  has an oral temperature above 102 F (38.9 C), not controlled by medicine.   Your baby is older than 3 months with a rectal temperature of 102.69F (38.9 C) or higher.   Your baby is 51 months old or younger with a rectal temperature of 100.4 F (38 C) or higher.   You develop excessive weakness, dizziness, fainting, or extreme thirst.   You develop a rash, stiff neck, severe headache, or you become irritable, sleepy, or difficult to awaken.  MAKE SURE YOU:   Understand these instructions.   Will watch your condition.   Will get help right away if you are not doing well or get worse.  Document Released: 01/29/2005 Document Revised: 08/14/2010 Document Reviewed: 12/28/2008 [facility] Patient Information 2012 [place], [place].Urinary Tract Infection Infections of the urinary tract can start in several places. A bladder infection (cystitis), a kidney infection (pyelonephritis), and a prostate infection (prostatitis) are different types of urinary tract infections (UTIs). They usually get better if treated with medicines (antibiotics) that kill germs. Take all the medicine until it is gone. You or your child may feel better in a few days,  but TAKE ALL MEDICINE or the infection may not respond and may become more difficult to treat. HOME CARE INSTRUCTIONS   Drink enough water and fluids to keep the urine clear or pale yellow. Cranberry juice is especially recommended, in addition to large amounts of water.   Avoid caffeine, tea, and carbonated beverages. They tend to irritate the bladder.   Alcohol may irritate the prostate.   Only take over-the-counter or prescription medicines for pain, discomfort, or fever as directed by your caregiver.  To prevent further infections:  Empty the bladder often. Avoid holding urine for long periods of time.   After a bowel movement, women should cleanse from front to back. Use each tissue only once.   Empty the bladder before and after sexual intercourse.   FINDING OUT THE RESULTS OF YOUR TEST Not all test results are available during your visit. If your or your child's test results are not back during the visit, make an appointment with your caregiver to find out the results. Do not assume everything is normal if you have not heard from your caregiver or the medical facility. It is important for you to follow up on all test results. SEEK MEDICAL CARE IF:   There is back pain.   Your baby is older than 3 months with a rectal temperature of 100.5 F (38.1 C) or higher for more than 1 day.   Your or your child's problems (symptoms) are no better in 3 days. Return sooner if you or your child is getting worse.  SEEK IMMEDIATE MEDICAL CARE IF:   There is severe back pain or lower abdominal pain.   You or your child develops chills.   You have a fever.   Your baby is older than 3 months with a rectal temperature of 102 F (38.9 C) or higher.   Your baby is 26 months old or younger with a rectal temperature of 100.4 F (38 C) or higher.   There is nausea or vomiting.   There is continued burning or discomfort with urination.  MAKE SURE YOU:   Understand these instructions.   Will watch your condition.   Will get help right away if you are not doing well or get worse.  Document Released: 11/08/2004 Document Revised: 10/11/2010 Document Reviewed: 06/13/2006 [facility] Patient Information 2012 [place], [place].
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
## 2011-04-12 LAB — URINE CULTURE
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
## 2011-07-24 ENCOUNTER — Emergency Department (HOSPITAL_COMMUNITY): Payer: Medicare Other

## 2011-07-24 ENCOUNTER — Inpatient Hospital Stay (HOSPITAL_COMMUNITY)
Admission: EM | Admit: 2011-07-24 | Discharge: 2011-07-30 | DRG: 193 | Disposition: A | Discharge status: Skilled Nursing Facility | Payer: Medicare Other | Attending: Internal Medicine | Admitting: Internal Medicine

## 2011-07-24 ENCOUNTER — Encounter (HOSPITAL_COMMUNITY): Payer: Self-pay | Admitting: Emergency Medicine

## 2011-07-24 DIAGNOSIS — R071 Chest pain on breathing: Secondary | ICD-10-CM | POA: Diagnosis not present

## 2011-07-24 DIAGNOSIS — J209 Acute bronchitis, unspecified: Secondary | ICD-10-CM | POA: Diagnosis present

## 2011-07-24 DIAGNOSIS — J189 Pneumonia, unspecified organism: Principal | ICD-10-CM

## 2011-07-24 DIAGNOSIS — I498 Other specified cardiac arrhythmias: Secondary | ICD-10-CM | POA: Diagnosis present

## 2011-07-24 DIAGNOSIS — F209 Schizophrenia, unspecified: Secondary | ICD-10-CM

## 2011-07-24 DIAGNOSIS — D509 Iron deficiency anemia, unspecified: Secondary | ICD-10-CM | POA: Diagnosis present

## 2011-07-24 DIAGNOSIS — Z6841 Body Mass Index (BMI) 40.0 and over, adult: Secondary | ICD-10-CM

## 2011-07-24 DIAGNOSIS — L89899 Pressure ulcer of other site, unspecified stage: Secondary | ICD-10-CM | POA: Diagnosis present

## 2011-07-24 DIAGNOSIS — F411 Generalized anxiety disorder: Secondary | ICD-10-CM | POA: Diagnosis present

## 2011-07-24 DIAGNOSIS — N390 Urinary tract infection, site not specified: Secondary | ICD-10-CM

## 2011-07-24 DIAGNOSIS — E669 Obesity, unspecified: Secondary | ICD-10-CM | POA: Diagnosis present

## 2011-07-24 DIAGNOSIS — F419 Anxiety disorder, unspecified: Secondary | ICD-10-CM

## 2011-07-24 DIAGNOSIS — Z930 Tracheostomy status: Secondary | ICD-10-CM

## 2011-07-24 DIAGNOSIS — D638 Anemia in other chronic diseases classified elsewhere: Secondary | ICD-10-CM

## 2011-07-24 DIAGNOSIS — L89109 Pressure ulcer of unspecified part of back, unspecified stage: Secondary | ICD-10-CM | POA: Diagnosis present

## 2011-07-24 DIAGNOSIS — J9601 Acute respiratory failure with hypoxia: Secondary | ICD-10-CM

## 2011-07-24 DIAGNOSIS — J9620 Acute and chronic respiratory failure, unspecified whether with hypoxia or hypercapnia: Secondary | ICD-10-CM | POA: Diagnosis present

## 2011-07-24 DIAGNOSIS — L8993 Pressure ulcer of unspecified site, stage 3: Secondary | ICD-10-CM | POA: Diagnosis present

## 2011-07-24 DIAGNOSIS — Z931 Gastrostomy status: Secondary | ICD-10-CM

## 2011-07-24 DIAGNOSIS — R1310 Dysphagia, unspecified: Secondary | ICD-10-CM | POA: Diagnosis present

## 2011-07-24 DIAGNOSIS — L8990 Pressure ulcer of unspecified site, unspecified stage: Secondary | ICD-10-CM

## 2011-07-24 DIAGNOSIS — R6510 Systemic inflammatory response syndrome (SIRS) of non-infectious origin without acute organ dysfunction: Secondary | ICD-10-CM

## 2011-07-24 DIAGNOSIS — R0602 Shortness of breath: Secondary | ICD-10-CM | POA: Diagnosis present

## 2011-07-24 DIAGNOSIS — N319 Neuromuscular dysfunction of bladder, unspecified: Secondary | ICD-10-CM

## 2011-07-24 DIAGNOSIS — G8250 Quadriplegia, unspecified: Secondary | ICD-10-CM

## 2011-07-24 LAB — CBC
HCT: 35.5 % — ABNORMAL LOW (ref 36.0–46.0)
MCHC: 30.7 g/dL (ref 30.0–36.0)
Platelets: 230 10*3/uL (ref 150–400)
RDW: 13.1 % (ref 11.5–15.5)
WBC: 10.8 10*3/uL — ABNORMAL HIGH (ref 4.0–10.5)

## 2011-07-24 LAB — BASIC METABOLIC PANEL
Chloride: 101 mEq/L (ref 96–112)
Creatinine, Ser: 1 mg/dL (ref 0.50–1.10)
GFR calc Af Amer: 77 mL/min — ABNORMAL LOW (ref >90)
Sodium: 142 mEq/L (ref 135–145)

## 2011-07-24 LAB — URINALYSIS, ROUTINE W REFLEX MICROSCOPIC
Bilirubin Urine: NEGATIVE
Ketones, ur: NEGATIVE mg/dL
Nitrite: POSITIVE — AB
Urobilinogen, UA: 0.2 mg/dL (ref 0.0–1.0)
pH: 7.5 (ref 5.0–8.0)

## 2011-07-24 LAB — URINE MICROSCOPIC-ADD ON

## 2011-07-24 LAB — DIFFERENTIAL
Basophils Absolute: 0 10*3/uL (ref 0.0–0.1)
Basophils Relative: 0 % (ref 0–1)
Monocytes Absolute: 0.6 10*3/uL (ref 0.1–1.0)
Neutro Abs: 6 10*3/uL (ref 1.7–7.7)
Neutrophils Relative %: 56 % (ref 43–77)

## 2011-07-24 NOTE — ED Provider Notes (Signed)
History     CSN: [id number]  Arrival date & time 07/24/11  2040   First MD Initiated Contact with Patient 07/24/11 2118      Chief Complaint  Patient presents with  . Shortness of Breath    (Consider location/radiation/quality/duration/timing/severity/associated sxs/prior treatment) HPI Pt from [facility] [place]- called for CP d/t work of breathing, pt has bil lower lobe PNA; pt has trach; pt needs sxning q67min; vss 100% on RA     Past Medical History  Diagnosis Date  . Respiratory failure   . Dysphagia   . Contracture of hand joint   . Anemia   . Neurogenic bladder   . Anxiety   . Schizophrenia   . Quadriplegia     Past Surgical History  Procedure Date  . Tracheostomy     History reviewed. No pertinent family history.  History  Substance Use Topics  . Smoking status: Never Smoker   . Smokeless tobacco: Not on file  . Alcohol Use: No    OB History    Grav Para Term Preterm Abortions TAB SAB Ect Mult Living                  Review of Systems  Unable to perform ROS   Allergies  Latex  Home Medications   No current outpatient prescriptions on file.  BP 155/88  Pulse 102  Temp 99.1 F (37.3 C) (Oral)  Resp 18  Ht 4\' 11"  (1.499 m)  Wt 234 lb 9.1 oz (106.4 kg)  BMI 47.38 kg/m2  SpO2 100%  Physical Exam  Constitutional: She is oriented to person, place, and time. She appears well-developed and well-nourished. No distress.  HENT:  Head: Normocephalic and atraumatic.  Right Ear: External ear normal.  Left Ear: External ear normal.  Nose: Nose normal.  Mouth/Throat: Oropharynx is clear and moist. No oropharyngeal exudate.  Eyes: Conjunctivae are normal. Pupils are equal, round, and reactive to light. Right eye exhibits no discharge. Left eye exhibits no discharge. No scleral icterus.  Neck: Normal range of motion. Neck supple.  Cardiovascular:  Sinus tachycardia.  Respiratory: Effort normal and breath sounds normal. No respiratory distress. She  has no wheezes. She has no rales.  GI: Soft. Bowel sounds are normal.  Peg tube in place.  Musculoskeletal: Normal range of motion. She exhibits edema. She exhibits no tenderness.  Neurological: She is alert and oriented to person, place, and time.  Quadriplegic.  Skin: She is not diaphoretic.  Psychiatric: Her behavior is normal.   ED Course  Procedures (including critical care time)  Labs Reviewed  URINALYSIS, ROUTINE W REFLEX MICROSCOPIC - Abnormal; Notable for the following:    APPearance TURBID (*)     Hgb urine dipstick LARGE (*)     Protein, ur 30 (*)     Nitrite POSITIVE (*)     Leukocytes, UA LARGE (*)     All other components within normal limits  CBC - Abnormal; Notable for the following:    WBC 10.8 (*)     Hemoglobin 10.9 (*)     HCT 35.5 (*)     MCV 77.3 (*)     MCH 23.7 (*)     All other components within normal limits  BASIC METABOLIC PANEL - Abnormal; Notable for the following:    Glucose, Bld 101 (*)     BUN 31 (*)     GFR calc non Af Amer 66 (*)     GFR calc Af Amer 77 (*)  All other components within normal limits  URINE MICROSCOPIC-ADD ON - Abnormal; Notable for the following:    Bacteria, UA MANY (*)     Crystals TRIPLE PHOSPHATE CRYSTALS (*)     All other components within normal limits  COMPREHENSIVE METABOLIC PANEL - Abnormal; Notable for the following:    Glucose, Bld 109 (*)     BUN 29 (*)     Albumin 2.8 (*)     Total Bilirubin 0.2 (*)     GFR calc non Af Amer 70 (*)     GFR calc Af Amer 81 (*)     All other components within normal limits  CBC - Abnormal; Notable for the following:    Hemoglobin 9.3 (*)     HCT 31.4 (*)     MCH 23.3 (*)     MCHC 29.6 (*)     All other components within normal limits  BASIC METABOLIC PANEL - Abnormal; Notable for the following:    Glucose, Bld 126 (*)     BUN 32 (*)     GFR calc non Af Amer 79 (*)     All other components within normal limits  CBC - Abnormal; Notable for the following:    RBC  3.77 (*)     Hemoglobin 8.8 (*)     HCT 29.8 (*)     MCH 23.3 (*)     MCHC 29.5 (*)     All other components within normal limits  URINE CULTURE  DIFFERENTIAL  DIFFERENTIAL  LACTIC ACID, PLASMA  CARDIAC PANEL(CRET KIN+CKTOT+MB+TROPI)  CARDIAC PANEL(CRET KIN+CKTOT+MB+TROPI)  CARDIAC PANEL(CRET KIN+CKTOT+MB+TROPI)  MRSA PCR SCREENING  PROCALCITONIN   No results found.   1. Hospital acquired PNA   2. SIRS (systemic inflammatory response syndrome)   3. Acute respiratory failure with hypoxia   4. Anemia of chronic disease   5. Schizophrenia       MDM         Nelia Shi, MD 07/28/11 1558

## 2011-07-24 NOTE — ED Notes (Signed)
Pt from [facility] [place]- called for CP d/t work of breathing, pt has bil lower lobe PNA; pt has trach; pt needs sxning q75min; vss 100% on RA, 99 HR, 145 BP palp.

## 2011-07-25 ENCOUNTER — Encounter (HOSPITAL_COMMUNITY): Payer: Self-pay | Admitting: Internal Medicine

## 2011-07-25 ENCOUNTER — Inpatient Hospital Stay (HOSPITAL_COMMUNITY): Payer: Medicare Other

## 2011-07-25 DIAGNOSIS — R6510 Systemic inflammatory response syndrome (SIRS) of non-infectious origin without acute organ dysfunction: Secondary | ICD-10-CM | POA: Diagnosis present

## 2011-07-25 DIAGNOSIS — N319 Neuromuscular dysfunction of bladder, unspecified: Secondary | ICD-10-CM | POA: Diagnosis present

## 2011-07-25 DIAGNOSIS — R0789 Other chest pain: Secondary | ICD-10-CM

## 2011-07-25 DIAGNOSIS — F209 Schizophrenia, unspecified: Secondary | ICD-10-CM | POA: Diagnosis present

## 2011-07-25 DIAGNOSIS — G8250 Quadriplegia, unspecified: Secondary | ICD-10-CM

## 2011-07-25 DIAGNOSIS — J9601 Acute respiratory failure with hypoxia: Secondary | ICD-10-CM | POA: Diagnosis present

## 2011-07-25 DIAGNOSIS — J209 Acute bronchitis, unspecified: Secondary | ICD-10-CM | POA: Diagnosis present

## 2011-07-25 DIAGNOSIS — Z930 Tracheostomy status: Secondary | ICD-10-CM

## 2011-07-25 DIAGNOSIS — J189 Pneumonia, unspecified organism: Secondary | ICD-10-CM | POA: Diagnosis present

## 2011-07-25 DIAGNOSIS — N390 Urinary tract infection, site not specified: Secondary | ICD-10-CM | POA: Diagnosis present

## 2011-07-25 DIAGNOSIS — F419 Anxiety disorder, unspecified: Secondary | ICD-10-CM | POA: Diagnosis present

## 2011-07-25 DIAGNOSIS — D638 Anemia in other chronic diseases classified elsewhere: Secondary | ICD-10-CM | POA: Diagnosis present

## 2011-07-25 DIAGNOSIS — L8993 Pressure ulcer of unspecified site, stage 3: Secondary | ICD-10-CM

## 2011-07-25 LAB — DIFFERENTIAL
Lymphocytes Relative: 36 % (ref 12–46)
Lymphs Abs: 3.4 10*3/uL (ref 0.7–4.0)
Neutro Abs: 5.1 10*3/uL (ref 1.7–7.7)
Neutrophils Relative %: 55 % (ref 43–77)

## 2011-07-25 LAB — COMPREHENSIVE METABOLIC PANEL
BUN: 29 mg/dL — ABNORMAL HIGH (ref 6–23)
CO2: 26 mEq/L (ref 19–32)
Calcium: 8.5 mg/dL (ref 8.4–10.5)
Chloride: 106 mEq/L (ref 96–112)
Creatinine, Ser: 0.96 mg/dL (ref 0.50–1.10)
GFR calc non Af Amer: 70 mL/min — ABNORMAL LOW (ref >90)
Total Bilirubin: 0.2 mg/dL — ABNORMAL LOW (ref 0.3–1.2)

## 2011-07-25 LAB — CARDIAC PANEL(CRET KIN+CKTOT+MB+TROPI)
CK, MB: 1.7 ng/mL (ref 0.3–4.0)
Relative Index: INVALID (ref 0.0–2.5)
Relative Index: INVALID (ref 0.0–2.5)
Relative Index: INVALID (ref 0.0–2.5)
Total CK: 71 U/L (ref 7–177)
Troponin I: <0.3 ng/mL (ref <0.30)
Troponin I: <0.3 ng/mL (ref <0.30)

## 2011-07-25 LAB — CBC
Platelets: 173 10*3/uL (ref 150–400)
RBC: 4 MIL/uL (ref 3.87–5.11)
WBC: 9.2 10*3/uL (ref 4.0–10.5)

## 2011-07-25 LAB — LACTIC ACID, PLASMA: Lactic Acid, Venous: 0.9 mmol/L (ref 0.5–2.2)

## 2011-07-25 MED ORDER — VENLAFAXINE HCL 50 MG PO TABS
100.0000 mg | ORAL_TABLET | Freq: Two times a day (BID) | ORAL | Status: DC
  Administered 2011-07-25 – 2011-07-30 (×11): 100 mg via ORAL
  Filled 2011-07-25 (×13): qty 2

## 2011-07-25 MED ORDER — POLYETHYLENE GLYCOL 3350 17 G PO PACK
17.0000 g | PACK | Freq: Every morning | ORAL | Status: DC
  Administered 2011-07-25 – 2011-07-30 (×6): 17 g via ORAL
  Filled 2011-07-25 (×6): qty 1

## 2011-07-25 MED ORDER — PANTOPRAZOLE SODIUM 40 MG PO TBEC
40.0000 mg | DELAYED_RELEASE_TABLET | Freq: Every day | ORAL | Status: DC
  Administered 2011-07-25 – 2011-07-30 (×6): 40 mg via ORAL
  Filled 2011-07-25 (×5): qty 1

## 2011-07-25 MED ORDER — ENOXAPARIN SODIUM 40 MG/0.4ML ~~LOC~~ SOLN
40.0000 mg | SUBCUTANEOUS | Status: DC
  Administered 2011-07-25 – 2011-07-30 (×6): 40 mg via SUBCUTANEOUS
  Filled 2011-07-25 (×6): qty 0.4

## 2011-07-25 MED ORDER — SODIUM CHLORIDE 0.9 % IV SOLN
INTRAVENOUS | Status: DC
  Administered 2011-07-25 (×2): via INTRAVENOUS
  Administered 2011-07-25: 150 mL/h via INTRAVENOUS
  Administered 2011-07-26 – 2011-07-27 (×2): via INTRAVENOUS
  Administered 2011-07-28: 20 mL/h via INTRAVENOUS

## 2011-07-25 MED ORDER — LEVOFLOXACIN IN D5W 750 MG/150ML IV SOLN
750.0000 mg | Freq: Once | INTRAVENOUS | Status: AC
  Administered 2011-07-25: 750 mg via INTRAVENOUS
  Filled 2011-07-25 (×2): qty 150

## 2011-07-25 MED ORDER — OMEGA-3-ACID ETHYL ESTERS 1 G PO CAPS
2.0000 g | ORAL_CAPSULE | Freq: Two times a day (BID) | ORAL | Status: DC
  Administered 2011-07-25 – 2011-07-30 (×10): 2 g via ORAL
  Filled 2011-07-25 (×13): qty 2

## 2011-07-25 MED ORDER — CEFEPIME HCL 2 G IJ SOLR
2.0000 g | Freq: Once | INTRAMUSCULAR | Status: AC
  Administered 2011-07-25: 2 g via INTRAVENOUS
  Filled 2011-07-25: qty 2

## 2011-07-25 MED ORDER — FERROUS SULFATE 325 (65 FE) MG PO TABS
325.0000 mg | ORAL_TABLET | Freq: Every day | ORAL | Status: DC
  Administered 2011-07-25 – 2011-07-30 (×6): 325 mg via ORAL
  Filled 2011-07-25 (×7): qty 1

## 2011-07-25 MED ORDER — ONDANSETRON HCL 4 MG/2ML IJ SOLN: 4.0000 mg | Freq: Four times a day (QID) | INTRAMUSCULAR | Status: DC | PRN

## 2011-07-25 MED ORDER — ACETAMINOPHEN 325 MG PO TABS
650.0000 mg | ORAL_TABLET | Freq: Four times a day (QID) | ORAL | Status: DC | PRN
  Administered 2011-07-25: 650 mg via ORAL
  Filled 2011-07-25: qty 2

## 2011-07-25 MED ORDER — PRO-STAT SUGAR FREE PO LIQD
30.0000 mL | Freq: Three times a day (TID) | ORAL | Status: DC
  Administered 2011-07-25 – 2011-07-30 (×15): 30 mL via ORAL
  Filled 2011-07-25 (×19): qty 30

## 2011-07-25 MED ORDER — DEXTROSE 5 % IV SOLN
1.0000 g | Freq: Two times a day (BID) | INTRAVENOUS | Status: DC
  Administered 2011-07-25 – 2011-07-28 (×6): 1 g via INTRAVENOUS
  Filled 2011-07-25 (×7): qty 1

## 2011-07-25 MED ORDER — ALBUTEROL SULFATE (5 MG/ML) 0.5% IN NEBU: 2.5000 mg | INHALATION_SOLUTION | RESPIRATORY_TRACT | Status: DC | PRN

## 2011-07-25 MED ORDER — IPRATROPIUM BROMIDE 0.02 % IN SOLN
500.0000 ug | Freq: Four times a day (QID) | RESPIRATORY_TRACT | Status: DC
  Administered 2011-07-25: 500 ug via RESPIRATORY_TRACT
  Filled 2011-07-25: qty 2.5

## 2011-07-25 MED ORDER — ADULT MULTIVITAMIN W/MINERALS CH
1.0000 | ORAL_TABLET | Freq: Every day | ORAL | Status: DC
  Administered 2011-07-25 – 2011-07-30 (×6): 1 via ORAL
  Filled 2011-07-25 (×6): qty 1

## 2011-07-25 MED ORDER — LEVALBUTEROL HCL 0.63 MG/3ML IN NEBU
0.6300 mg | INHALATION_SOLUTION | Freq: Four times a day (QID) | RESPIRATORY_TRACT | Status: DC | PRN
  Administered 2011-07-25 – 2011-07-29 (×5): 0.63 mg via RESPIRATORY_TRACT
  Filled 2011-07-25 (×5): qty 3

## 2011-07-25 MED ORDER — OXYCODONE HCL 5 MG PO TABS
5.0000 mg | ORAL_TABLET | ORAL | Status: DC | PRN
  Administered 2011-07-26 (×3): 10 mg via ORAL
  Administered 2011-07-27 (×2): 5 mg via ORAL
  Administered 2011-07-27 – 2011-07-30 (×7): 10 mg via ORAL
  Filled 2011-07-25 (×7): qty 2
  Filled 2011-07-25: qty 1
  Filled 2011-07-25: qty 2
  Filled 2011-07-25: qty 1
  Filled 2011-07-25 (×2): qty 2

## 2011-07-25 MED ORDER — CLONAZEPAM 0.5 MG PO TABS
0.5000 mg | ORAL_TABLET | Freq: Two times a day (BID) | ORAL | Status: DC
  Administered 2011-07-25 – 2011-07-30 (×11): 0.5 mg via ORAL
  Filled 2011-07-25 (×11): qty 1

## 2011-07-25 MED ORDER — ONDANSETRON HCL 4 MG PO TABS: 4.0000 mg | ORAL_TABLET | Freq: Four times a day (QID) | ORAL | Status: DC | PRN

## 2011-07-25 MED ORDER — PIPERACILLIN-TAZOBACTAM 3.375 G IVPB
3.3750 g | Freq: Once | INTRAVENOUS | Status: AC
  Administered 2011-07-25: 3.375 g via INTRAVENOUS
  Filled 2011-07-25: qty 50

## 2011-07-25 MED ORDER — TRAZODONE HCL 50 MG PO TABS: 50.0000 mg | ORAL_TABLET | Freq: Every evening | ORAL | Status: DC | PRN

## 2011-07-25 MED ORDER — SENNA 8.6 MG PO TABS: 2.0000 | ORAL_TABLET | Freq: Every day | ORAL | Status: DC | PRN

## 2011-07-25 MED ORDER — POLYVINYL ALCOHOL 1.4 % OP SOLN
1.0000 [drp] | Freq: Four times a day (QID) | OPHTHALMIC | Status: DC
  Administered 2011-07-25 – 2011-07-30 (×22): 1 [drp] via OPHTHALMIC
  Filled 2011-07-25 (×2): qty 15

## 2011-07-25 MED ORDER — TRAZODONE HCL 100 MG PO TABS
100.0000 mg | ORAL_TABLET | Freq: Every day | ORAL | Status: DC
  Administered 2011-07-25 – 2011-07-29 (×5): 100 mg via ORAL
  Filled 2011-07-25 (×6): qty 1

## 2011-07-25 MED ORDER — SODIUM CHLORIDE 0.9 % IV BOLUS (SEPSIS)
500.0000 mL | Freq: Once | INTRAVENOUS | Status: AC
  Administered 2011-07-25: 500 mL via INTRAVENOUS

## 2011-07-25 MED ORDER — SODIUM CHLORIDE 0.9 % IJ SOLN
3.0000 mL | Freq: Two times a day (BID) | INTRAMUSCULAR | Status: DC
  Administered 2011-07-25: 3 mL via INTRAVENOUS

## 2011-07-25 MED ORDER — VANCOMYCIN HCL IN DEXTROSE 1-5 GM/200ML-% IV SOLN
1000.0000 mg | Freq: Two times a day (BID) | INTRAVENOUS | Status: DC
  Administered 2011-07-25 – 2011-07-28 (×6): 1000 mg via INTRAVENOUS
  Filled 2011-07-25 (×7): qty 200

## 2011-07-25 MED ORDER — VANCOMYCIN HCL IN DEXTROSE 1-5 GM/200ML-% IV SOLN: 1000.0000 mg | Freq: Once | INTRAVENOUS | Status: DC

## 2011-07-25 MED ORDER — DOCUSATE SODIUM 100 MG PO CAPS
100.0000 mg | ORAL_CAPSULE | Freq: Two times a day (BID) | ORAL | Status: DC
  Administered 2011-07-25 – 2011-07-30 (×11): 100 mg via ORAL
  Filled 2011-07-25 (×11): qty 1

## 2011-07-25 MED ORDER — OLANZAPINE 7.5 MG PO TABS
15.0000 mg | ORAL_TABLET | Freq: Every day | ORAL | Status: DC
  Administered 2011-07-25 – 2011-07-28 (×4): 15 mg via ORAL
  Filled 2011-07-25 (×7): qty 2

## 2011-07-25 MED ORDER — VANCOMYCIN HCL IN DEXTROSE 1-5 GM/200ML-% IV SOLN
1000.0000 mg | Freq: Once | INTRAVENOUS | Status: AC
  Administered 2011-07-25: 1000 mg via INTRAVENOUS
  Filled 2011-07-25: qty 200

## 2011-07-25 MED ORDER — METOPROLOL TARTRATE 1 MG/ML IV SOLN: 5.0000 mg | INTRAVENOUS | Status: DC | PRN

## 2011-07-25 MED ORDER — IOHEXOL 350 MG/ML SOLN
100.0000 mL | Freq: Once | INTRAVENOUS | Status: AC | PRN
  Administered 2011-07-25: 100 mL via INTRAVENOUS

## 2011-07-25 MED ORDER — JUVEN PO PACK
1.0000 | PACK | Freq: Two times a day (BID) | ORAL | Status: DC
  Administered 2011-07-25 – 2011-07-30 (×8): 1 via ORAL
  Filled 2011-07-25 (×13): qty 1

## 2011-07-25 MED ORDER — HYDROCODONE-ACETAMINOPHEN 5-325 MG PO TABS: 2.0000 | ORAL_TABLET | Freq: Four times a day (QID) | ORAL | Status: DC | PRN

## 2011-07-25 MED ORDER — LEVOFLOXACIN IN D5W 500 MG/100ML IV SOLN
500.0000 mg | INTRAVENOUS | Status: DC
  Administered 2011-07-26 – 2011-07-28 (×3): 500 mg via INTRAVENOUS
  Filled 2011-07-25 (×3): qty 100

## 2011-07-25 MED ORDER — OXYBUTYNIN CHLORIDE 5 MG PO TABS: 5.0000 mg | ORAL_TABLET | Freq: Two times a day (BID) | ORAL | Status: DC | PRN

## 2011-07-25 MED ORDER — ACETAMINOPHEN 650 MG RE SUPP: 650.0000 mg | Freq: Four times a day (QID) | RECTAL | Status: DC | PRN
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
## 2011-07-25 NOTE — Progress Notes (Signed)
Utilization review completed.  

## 2011-07-25 NOTE — Progress Notes (Signed)
[facility] [place] TEAM 1 - Stepdown/ICU TEAM  Subjective: Alert and able to verbally communicate secondary to PMV in place. Tells [place] she would prefer regular diet as opposed to heart healthy. Currently denies shortness of breath but is still having some pleuritic midsternal chest pain. No further purulent secretions noted from tracheostomy tube.  Objective: Blood pressure 138/80, pulse 137, temperature 99.1 F (37.3 C), temperature source Oral, resp. rate 15, height 4\' 11"  (1.499 m), weight 106.4 kg (234 lb 9.1 oz), SpO2 95.00%.  Intake/Output from previous day: 06/11 0701 - 06/12 0700 In: 500 [I.V.:450; IV Piggyback:50] Out: -  Intake/Output this shift: Total I/O In: 890 [P.O.:440; I.V.:450] Out: 350 [Urine:350]  F/u exam completed  Lab Results:  Basename 07/25/11 0500 07/24/11 2250  WBC 9.2 10.8*  HGB 9.3* 10.9*  HCT 31.4* 35.5*  PLT 173 230   BMET  Basename 07/25/11 0500 07/24/11 2250  NA 141 142  K 4.2 4.1  CL 106 101  CO2 26 29  GLUCOSE 109* 101*  BUN 29* 31*  CREATININE 0.96 1.00  CALCIUM 8.5 9.8    Studies/Results: Ct Angio Chest W/cm &/or Wo Cm  07/25/2011  *RADIOLOGY REPORT*  Clinical Data: Chest pain  CT ANGIOGRAPHY CHEST  Technique:  Multidetector CT imaging of the chest using the standard protocol during bolus administration of intravenous contrast. Multiplanar reconstructed images including MIPs were obtained and reviewed to evaluate the vascular anatomy.  Contrast: OMNIPAQUE IOHEXOL 350 MG/ML SOLN  Comparison: 07/24/2011 radiograph  Findings: Degraded by unconventional positioning and streak artifact from the patient's extremities.  No pulmonary arterial branch filling defect identified.  Cardiomegaly.  Aorta is of normal caliber with scattered atherosclerosis.  No pleural or pericardial effusion.  Limited images through the upper abdomen show no acute abnormality.  Tracheostomy tube.  Enlarged thyroid gland.  Debris within the main bronchi.   Bibasilar opacities.  No pneumothorax.  Left-sided chest wall Port-A-Cath, with catheter tip in the distal SVC.  There are prominent right chest wall collateral vessels the suggestion that the right brachial cephalic vein may be occluded.  Curvature of the spine.  No acute osseous finding.  IMPRESSION: Degraded by streak artifact from the extremity positioning.  No main or lobar branch pulmonary embolism identified.  Poorly evaluated segmental branches.  Tracheostomy tube in place.  There is debris within the main bronchi and by basilar opacities.  This may represent a combination of aspiration pneumonitis, infection, and atelectasis.  Prominent right chest wall collateral vessels suggests right subclavian or brachiocephalic vein stenosis/occlusion.  Original Report Authenticated By: Waneta Martins, M.D.   Dg Chest Portable 1 View  07/24/2011  *RADIOLOGY REPORT*  Clinical Data: Shortness of breath.  PORTABLE CHEST - 1 VIEW  Comparison: 11/05/2010  Findings: Degraded by rotation and further limited by the lower lungs/hemidiaphragms be excluded from the image.  Cardiomegaly. Tracheostomy tube.  Left chest wall Port-A-Cath with tip projecting over the mid SVC.  Bibasilar opacities and probable layering pleural effusions are partially imaged. No pneumothorax.  IMPRESSION: Degraded by rotation/positioning.  Bibasilar opacities may reflect atelectasis, infiltrate, or aspiration.  Probable associated small pleural effusions.  Original Report Authenticated By: Waneta Martins, M.D.    Medications: I have reviewed the patient's current medications.  Assessment/Plan:  SIRS (systemic inflammatory response syndrome) *Current clinical exam as well as diagnostic findings suggest improvement in sx   Acute respiratory failure with hypoxia *Markedly improved and now on low-flow oxygen *Cont supportive care and treat underlying causes  Tracheobronchitis, acute or subacute/ Healthcare-associated  pneumonia *Secretions have markedly decreased after arrival to hospital and subsequent aggressive treatment *Continue broad-spectrum antibiotics with cefepime, Levaquin, and vancomycin-day #2  UTI (lower urinary tract infection)/ Neurogenic bladder with chronic Foley catheter *Followup on culture but suspect abnormal urinalysis more related to chronic Foley catheter and chronic colonization   Anemia of chronic disease *Hemoglobin stable and continue to follow *Continue iron supplement but watch for consitpation  Chronic respiratory failure /Tracheostomy in place/Quadriplegia *Continue PM valve *Has demonstrated trach so cannot use positive pressure ventilation i.e. does not have a cough that can be inflated   Schizophrenia/Anxiety disorder *Continue clonazepam, Effexor lower and trazodone   Disposition *Transfer to medical floor   LOS: 1 day   Junious Silk, ANP pager [phone]  [facility] [email or web address] Password: [email or web address]  07/25/2011, 1:37 PM  I have personally examined this patient and reviewed the entire database. I have reviewed the above note, made any necessary editorial changes, and agree with its content.  Lonia Blood, MD [facility]

## 2011-07-25 NOTE — Clinical Social Work Psychosocial (Signed)
     Clinical Social Work Department BRIEF PSYCHOSOCIAL ASSESSMENT 07/25/2011  Patient:  [name], [name]     Account Number:  [id number]     Admit date:  07/24/2011  Clinical Social Worker:  Dennison Bulla  Date/Time:  07/25/2011 12:30 PM  Referred by:  Physician  Date Referred:  07/25/2011 Referred for  SNF Placement   Other Referral:   Interview type:  Patient Other interview type:   Sister    PSYCHOSOCIAL DATA Living Status:  FACILITY Admitted from facility:  [facility] Level of care:  Skilled Nursing Facility Primary support name:  Adela Lank Primary support relationship to patient:  SIBLING Degree of support available:   Strong    CURRENT CONCERNS Current Concerns  Post-Acute Placement   Other Concerns:    SOCIAL WORK ASSESSMENT / PLAN CSW received referral due to patient being admitted from a facility. CSW reviewed chart and spoke with bedside RN. CSW met with patient at bedside. No visitors were present.    CSW introduced myself and explained role. CSW verified that patient is from [facility]. Patient reports she has been at [facility] for about 2 years. Patient reports she wishes to return to SNF at dc. CSW explained that CSW would contact family and SNF regarding dc plans. CSW called and spoke with sister who is agreeable to dc back to SNF. CSW called SNF who is agreeable to accept patient at dc.    CSW completed FL2 and placed in shadow chart for MD signature. CSW will continue to follow to assist with dc needs.   Assessment/plan status:  Psychosocial Support/Ongoing Assessment of Needs Other assessment/ plan:   Information/referral to community resources:   Will return to SNF    PATIENTS/FAMILYS RESPONSE TO PLAN OF CARE: Patient reported that she wanted to return to SNF. Patient was grateful of CSW visit. Patient's sister was appreciative of CSW call and has CSW contact information if needed.

## 2011-07-25 NOTE — Care Management Note (Signed)
    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   (If response is "NO", the following Medicare IM given date fields will be blank) Date Medicare IM given:   Date Additional Medicare IM given:    Discharge Disposition:  SKILLED NURSING FACILITY  Per UR Regulation:  Reviewed for med. necessity/level of care/duration of stay  If discussed at Long Length of Stay Meetings, dates discussed:    Comments:  PCP- Robson  07/25/11- 1015- Donn Pierini RN, BSN [phone] UR completed, pt from [facility] SNF, Hx. quadriplegia- CSW consulted for d/c needs to return to SNF when medically stable.
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
## 2011-07-25 NOTE — Progress Notes (Signed)
[facility] [place] TEAM 1 - Stepdown/ICU TEAM  Just prior to her transfer, the pt developed sinus tachycardia, with HR into the 140 range. She reported a sensation of SOB associated with this tachycardia.    As a result, I have cancelled her transfer to a medical bed.  I am also giving her a 500cc NS bolus.   Lonia Blood, MD [facility] Office  [phone] Pager [phone]  On-Call/Text Page:      [email or web address]      password [facility]

## 2011-07-25 NOTE — Progress Notes (Signed)
HR 120s. BP 136/79. O2 95% on 28% trach collar. Temp 99.5. Pt complaining of SOB. PRN neb tx given (xopenex)   MD notified of increased, yet improved HR. Tylenol given per order.  Will continue to monitor HR. Maximino Greenland RN

## 2011-07-26 DIAGNOSIS — J209 Acute bronchitis, unspecified: Secondary | ICD-10-CM

## 2011-07-26 DIAGNOSIS — R000 Tachycardia, unspecified: Secondary | ICD-10-CM

## 2011-07-26 DIAGNOSIS — E860 Dehydration: Secondary | ICD-10-CM

## 2011-07-26 LAB — CBC
Hemoglobin: 8.8 g/dL — ABNORMAL LOW (ref 12.0–15.0)
MCH: 23.3 pg — ABNORMAL LOW (ref 26.0–34.0)
MCV: 79 fL (ref 78.0–100.0)
RBC: 3.77 MIL/uL — ABNORMAL LOW (ref 3.87–5.11)
WBC: 7.3 10*3/uL (ref 4.0–10.5)

## 2011-07-26 LAB — BASIC METABOLIC PANEL
CO2: 27 mEq/L (ref 19–32)
GFR calc non Af Amer: 79 mL/min — ABNORMAL LOW (ref >90)
Glucose, Bld: 126 mg/dL — ABNORMAL HIGH (ref 70–99)
Potassium: 4.3 mEq/L (ref 3.5–5.1)
Sodium: 139 mEq/L (ref 135–145)

## 2011-07-26 LAB — URINE CULTURE
Colony Count: >=100000
Culture  Setup Time: 201306112249

## 2011-07-26 NOTE — Progress Notes (Signed)
[facility] [place] TEAM 1 - Stepdown/ICU TEAM  Subjective: Very groggy this morning but arousable. Verbalizes she is cold. Review of medications reveals patient recently received narcotic pain medication just before 6 AM  Objective: Blood pressure 137/80, pulse 85, temperature 98.4 F (36.9 C), temperature source Oral, resp. rate 1, height 4\' 11"  (1.499 m), weight 106.4 kg (234 lb 9.1 oz), SpO2 100.00%.  Intake/Output from previous day: 06/12 0701 - 06/13 0700 In: 4465 [P.O.:440; I.V.:3925; IV Piggyback:100] Out: 4450 [Urine:4450] Intake/Output this shift: Total I/O In: 500 [P.O.:500] Out: 2100 [Urine:2100]  Physical exam: General appearance: cooperative, appears stated age and no distress Resp: clear to auscultation bilaterally, and fenestrated tracheostomy with trach collar with 20% FiO2/5 L bleed in Cardio: regular rate and rhythm, S1, S2 normal, no murmur, click, rub or gallop, IV fluids at 150 cc per hour GI: soft, non-tender; bowel sounds normal; no masses,  no organomegaly Extremities: extremities normal, atraumatic, no cyanosis or edema Neurologic: Awakens but still lethargic as compared to yesterday's baseline, has expected physical exam findings consistent with chronic quadriplegia   Lab Results:  Basename 07/26/11 0500 07/25/11 0500  WBC 7.3 9.2  HGB 8.8* 9.3*  HCT 29.8* 31.4*  PLT 154 173   BMET  Basename 07/26/11 0500 07/25/11 0500  NA 139 141  K 4.3 4.2  CL 105 106  CO2 27 26  GLUCOSE 126* 109*  BUN 32* 29*  CREATININE 0.87 0.96  CALCIUM 8.5 8.5    Studies/Results: Ct Angio Chest W/cm &/or Wo Cm  07/25/2011  *RADIOLOGY REPORT*  Clinical Data: Chest pain  CT ANGIOGRAPHY CHEST  Technique:  Multidetector CT imaging of the chest using the standard protocol during bolus administration of intravenous contrast. Multiplanar reconstructed images including MIPs were obtained and reviewed to evaluate the vascular anatomy.  Contrast: OMNIPAQUE  IOHEXOL 350 MG/ML SOLN  Comparison: 07/24/2011 radiograph  Findings: Degraded by unconventional positioning and streak artifact from the patient's extremities.  No pulmonary arterial branch filling defect identified.  Cardiomegaly.  Aorta is of normal caliber with scattered atherosclerosis.  No pleural or pericardial effusion.  Limited images through the upper abdomen show no acute abnormality.  Tracheostomy tube.  Enlarged thyroid gland.  Debris within the main bronchi.  Bibasilar opacities.  No pneumothorax.  Left-sided chest wall Port-A-Cath, with catheter tip in the distal SVC.  There are prominent right chest wall collateral vessels the suggestion that the right brachial cephalic vein may be occluded.  Curvature of the spine.  No acute osseous finding.  IMPRESSION: Degraded by streak artifact from the extremity positioning.  No main or lobar branch pulmonary embolism identified.  Poorly evaluated segmental branches.  Tracheostomy tube in place.  There is debris within the main bronchi and by basilar opacities.  This may represent a combination of aspiration pneumonitis, infection, and atelectasis.  Prominent right chest wall collateral vessels suggests right subclavian or brachiocephalic vein stenosis/occlusion.  Original Report Authenticated By: Waneta Martins, M.D.   Dg Chest Portable 1 View  07/24/2011  *RADIOLOGY REPORT*  Clinical Data: Shortness of breath.  PORTABLE CHEST - 1 VIEW  Comparison: 11/05/2010  Findings: Degraded by rotation and further limited by the lower lungs/hemidiaphragms be excluded from the image.  Cardiomegaly. Tracheostomy tube.  Left chest wall Port-A-Cath with tip projecting over the mid SVC.  Bibasilar opacities and probable layering pleural effusions are partially imaged. No pneumothorax.  IMPRESSION: Degraded by rotation/positioning.  Bibasilar opacities may reflect atelectasis, infiltrate, or aspiration.  Probable associated small  pleural effusions.  Original Report  Authenticated By: Waneta Martins, M.D.    Medications: I have reviewed the patient's current medications.  Assessment/Plan:  SIRS (systemic inflammatory response syndrome) *Current clinical exam as well as diagnostic findings suggest improvement in sx   Acute respiratory failure with hypoxia *Markedly improved and now on low-flow oxygen *Cont supportive care and treat underlying causes  Sinus tachycardia *Felt to be secondary to volume depletion yesterday therefore was started on IV fluid resuscitation *Tachycardia has resolved we will continue to monitor before transferring out of stepdown  Tracheobronchitis, acute or subacute/ Healthcare-associated pneumonia *Secretions have markedly decreased after arrival to hospital and subsequent aggressive treatment *Continue broad-spectrum antibiotics with cefepime, Levaquin, and vancomycin-day #3 *No sputum collection has been obtained we'll continue empiric therapy  UTI (lower urinary tract infection)/ Neurogenic bladder with chronic Foley catheter *Followup on culture but suspect abnormal urinalysis more related to chronic Foley catheter and chronic colonization   Anemia of chronic disease *Hemoglobin stable and continue to follow *Continue iron supplement but watch for consitpation  Chronic respiratory failure /Tracheostomy in place/Quadriplegia *Continue PM valve *Has demonstrated trach so cannot use positive pressure ventilation i.e. does not have a cough that can be inflated   Schizophrenia/Anxiety disorder *Continue clonazepam, Effexor lower and trazodone   Disposition *Transfer out of SDU today.    LOS: 2 days   Junious Silk, ANP pager [phone]  [facility] [email or web address] Password: [email or web address]  07/26/2011, 12:45 PM  I have examined the patient and reviewed the chart. I agree with the above note which I have modified.   Calvert Cantor, MD [phone]

## 2011-07-26 NOTE — Progress Notes (Signed)
Pt sux with large amt of thick secretions

## 2011-07-26 NOTE — Progress Notes (Signed)
Pt. Has been transferred to 3005 in her bed. Phone report called to [place], Charity fundraiser. Patient made aware of the transfer.

## 2011-07-26 NOTE — Progress Notes (Signed)
Pt ordered bipap but no setting, contacting rn to contact md for specific orders and setting.

## 2011-07-27 DIAGNOSIS — R0789 Other chest pain: Secondary | ICD-10-CM

## 2011-07-27 DIAGNOSIS — R000 Tachycardia, unspecified: Secondary | ICD-10-CM

## 2011-07-27 DIAGNOSIS — J209 Acute bronchitis, unspecified: Secondary | ICD-10-CM

## 2011-07-27 MED ORDER — IBUPROFEN 600 MG PO TABS
600.0000 mg | ORAL_TABLET | Freq: Four times a day (QID) | ORAL | Status: DC | PRN
  Administered 2011-07-27: 600 mg via ORAL
  Filled 2011-07-27 (×2): qty 1

## 2011-07-27 NOTE — Progress Notes (Signed)
Subjective: Patient is awake and alert. Her only complaint is pain in the chest wall area. She states that this pain in the chest wall has been occurring for more than a month. She states that the pain is no different today than it has been. And the pain is occurring diffusely over the anterior chest wall area. The pain is increased with palpation of the chest wall.  Objective: Filed Vitals:   07/27/11 0822 07/27/11 1212 07/27/11 1332 07/27/11 1524  BP:   140/69   Pulse: 96 87 101 113  Temp:   98.2 F (36.8 C)   TempSrc:   Oral   Resp: 19 13 16 14   Height:      Weight:      SpO2: 100% 100% 100% 100%   Weight change:   Intake/Output Summary (Last 24 hours) at 07/27/11 1723 Last data filed at 07/27/11 1300  Gross per 24 hour  Intake    360 ml  Output   2900 ml  Net  -2540 ml    General: Alert, awake, oriented x3, in no acute distress. She is quadriplegic and has movement only of her shoulders. However she is able to speak with her valve in place in her trach. HEENT: [place]/AT PEERL, EOMI Neck: Trachea midline, with tracheostomy noted. They are  no masses, no thyromegal,y no JVD, no carotid bruit OROPHARYNX:  Moist, No exudate/ erythema/lesions.  Heart: Regular rate and rhythm, without murmurs, rubs, gallops, PMI non-displaced, no heaves or thrills on palpation.  Lungs: Clear to auscultation, no wheezing or rhonchi noted. Abdomen: Obese, soft, nontender, nondistended, positive bowel sounds, no masses no hepatosplenomegaly noted..  Neurological: Patient fully awake and alert. She is able to answer questions appropriately and engage in conversation in a logical and reasonable manner.    Lab Results:  Basename 07/26/11 0500 07/25/11 0500  NA 139 141  K 4.3 4.2  CL 105 106  CO2 27 26  GLUCOSE 126* 109*  BUN 32* 29*  CREATININE 0.87 0.96  CALCIUM 8.5 8.5  MG -- --  PHOS -- --    Basename 07/25/11 0500  AST 10  ALT 10  ALKPHOS 70  BILITOT 0.2*  PROT 6.9  ALBUMIN 2.8*    No results found for this basename: LIPASE:2,AMYLASE:2 in the last 72 hours  Basename 07/26/11 0500 07/25/11 0500 07/24/11 2250  WBC 7.3 9.2 --  NEUTROABS -- 5.1 6.0  HGB 8.8* 9.3* --  HCT 29.8* 31.4* --  MCV 79.0 78.5 --  PLT 154 173 --    Basename 07/25/11 2020 07/25/11 1335 07/25/11 0500  CKTOTAL 78 74 71  CKMB 2.2 1.8 1.7  CKMBINDEX -- -- --  TROPONINI <0.30 <0.30 <0.30   No components found with this basename: POCBNP:3 No results found for this basename: DDIMER:2 in the last 72 hours No results found for this basename: HGBA1C:2 in the last 72 hours No results found for this basename: CHOL:2,HDL:2,LDLCALC:2,TRIG:2,CHOLHDL:2,LDLDIRECT:2 in the last 72 hours No results found for this basename: TSH,T4TOTAL,FREET3,T3FREE,THYROIDAB in the last 72 hours No results found for this basename: VITAMINB12:2,FOLATE:2,FERRITIN:2,TIBC:2,IRON:2,RETICCTPCT:2 in the last 72 hours  Micro Results: Recent Results (from the past 240 hour(s))  URINE CULTURE     Status: Normal   Collection Time   07/24/11 10:13 PM      Component Value Range Status Comment   Specimen Description URINE, CATHETERIZED   Final    Special Requests NONE   Final    Culture  Setup Time [id number]   Final  Colony Count >=100,000 COLONIES/ML   Final    Culture     Final    Value: Multiple bacterial morphotypes present, none predominant. Suggest appropriate recollection if clinically indicated.   Report Status 07/26/2011 FINAL   Final   MRSA PCR SCREENING     Status: Normal   Collection Time   07/25/11  4:30 AM      Component Value Range Status Comment   MRSA by PCR NEGATIVE  NEGATIVE Final     Studies/Results: Ct Angio Chest W/cm &/or Wo Cm  07/25/2011  *RADIOLOGY REPORT*  Clinical Data: Chest pain  CT ANGIOGRAPHY CHEST  Technique:  Multidetector CT imaging of the chest using the standard protocol during bolus administration of intravenous contrast. Multiplanar reconstructed images including MIPs were obtained  and reviewed to evaluate the vascular anatomy.  Contrast: OMNIPAQUE IOHEXOL 350 MG/ML SOLN  Comparison: 07/24/2011 radiograph  Findings: Degraded by unconventional positioning and streak artifact from the patient's extremities.  No pulmonary arterial branch filling defect identified.  Cardiomegaly.  Aorta is of normal caliber with scattered atherosclerosis.  No pleural or pericardial effusion.  Limited images through the upper abdomen show no acute abnormality.  Tracheostomy tube.  Enlarged thyroid gland.  Debris within the main bronchi.  Bibasilar opacities.  No pneumothorax.  Left-sided chest wall Port-A-Cath, with catheter tip in the distal SVC.  There are prominent right chest wall collateral vessels the suggestion that the right brachial cephalic vein may be occluded.  Curvature of the spine.  No acute osseous finding.  IMPRESSION: Degraded by streak artifact from the extremity positioning.  No main or lobar branch pulmonary embolism identified.  Poorly evaluated segmental branches.  Tracheostomy tube in place.  There is debris within the main bronchi and by basilar opacities.  This may represent a combination of aspiration pneumonitis, infection, and atelectasis.  Prominent right chest wall collateral vessels suggests right subclavian or brachiocephalic vein stenosis/occlusion.  Original Report Authenticated By: Waneta Martins, M.D.   Dg Chest Portable 1 View  07/24/2011  *RADIOLOGY REPORT*  Clinical Data: Shortness of breath.  PORTABLE CHEST - 1 VIEW  Comparison: 11/05/2010  Findings: Degraded by rotation and further limited by the lower lungs/hemidiaphragms be excluded from the image.  Cardiomegaly. Tracheostomy tube.  Left chest wall Port-A-Cath with tip projecting over the mid SVC.  Bibasilar opacities and probable layering pleural effusions are partially imaged. No pneumothorax.  IMPRESSION: Degraded by rotation/positioning.  Bibasilar opacities may reflect atelectasis, infiltrate, or  aspiration.  Probable associated small pleural effusions.  Original Report Authenticated By: Waneta Martins, M.D.    Medications: I have reviewed the patient's current medications. Scheduled Meds:   . ceFEPime (MAXIPIME) IV  1 g Intravenous Q12H  . clonazePAM  0.5 mg Oral BID  . docusate sodium  100 mg Oral BID  . enoxaparin  40 mg Subcutaneous Q24H  . feeding supplement  30 mL Oral TID WC  . ferrous sulfate  325 mg Oral Q breakfast  . levofloxacin (LEVAQUIN) IV  500 mg Intravenous Q24H  . multivitamin with minerals  1 tablet Oral Daily  . nutrition supplement  1 packet Oral BID BM  . OLANZapine  15 mg Oral QHS  . omega-3 acid ethyl esters  2 g Oral BID WC  . pantoprazole  40 mg Oral Q1200  . polyethylene glycol  17 g Oral q morning - 10a  . polyvinyl alcohol  1 drop Both Eyes QID  . traZODone  100 mg Oral QHS  .  vancomycin  1,000 mg Intravenous Q12H  . venlafaxine  100 mg Oral BID   Continuous Infusions:   . sodium chloride 150 mL/hr at 07/26/11 2246   PRN Meds:.acetaminophen, acetaminophen, ibuprofen, levalbuterol, ondansetron (ZOFRAN) IV, ondansetron, oxybutynin, oxyCODONE, senna, traZODone Assessment/Plan: Patient Active Hospital Problem List: SIRS (systemic inflammatory response syndrome) (07/25/2011)   Assessment: Resolved    Healthcare-associated pneumonia/Tracheobronchitis, acute(07/25/2011)   Assessment: Patient is being treated for healthcare associated pneumonia versus pneumonitis. She's on day #2 appropriate antibiotics. He still had not been able to obtain a sputum sample for her. However the patient appears clinically stable.    Acute respiratory failure with hypoxia (07/25/2011)   Assessment: Patient is satting 100% on 28% trach collar. Last respiratory to try to wean her to room air.    UTI (lower urinary tract infection) (07/25/2011)   Assessment: The urine culture shows multiple bacterial morphotypes which I suspect is a reflection of her chronic Foley  catheter rather than a pathological process.   Anemia of chronic disease (07/25/2011)   Assessment: Patient's hemoglobin at her baseline. I suspect she was somewhat hemoconcentrated upon arrival to the emergency room.    Schizophrenia (07/25/2011)   Assessment: Continue clonazepam, Effexor and trazodone      LOS: 3 days

## 2011-07-27 NOTE — Progress Notes (Signed)
Pt asking for prn tx, waiting for pharmacy to send up.

## 2011-07-27 NOTE — Clinical Documentation Improvement (Signed)
PRESSURE ULCER DOCUMENTATION CLARIFICATION QUERY  THIS DOCUMENT IS NOT A PERMANENT PART OF THE MEDICAL RECORD  TO RESPOND TO THE THIS QUERY, FOLLOW THE INSTRUCTIONS BELOW:  1. If needed, update documentation for the patient's encounter via the notes activity.  2. Access this query again and click edit on the In [employer].  3. After updating, or not, click F2 to complete all highlighted (required) fields concerning your review. Select "additional documentation in the medical record" OR "no additional documentation provided".  4. Click Sign note button.  5. The deficiency will fall out of your In Basket *Please let us know if you are not able to complete this workflow by phone or e-mail (listed below).  Please update your documentation within the medical record to reflect your response to this query.                                                                                    07/27/11  Dear Dr. Ashley Royalty, In a better effort to capture your patient's severity of illness, reflect appropriate length of stay and utilization of resources, a review of the patient medical record has revealed the following indicators.    Based on your clinical judgment, please clarify and document in a progress note and/or discharge summary the clinical condition associated with the following supporting information:                                            PLEASE NOTE IF PRESENT ON ADMISSION     Possible Clinical Conditions?   _______Stage  II Pressure Ulcer  (blister open or unopened) _______Stage  III Pressure Ulcer (through all layers skin) _______Stage IV Pressure Ulcer   (through skin & underlying  muscle, tendons, and bones) _______Other Condition _______Cannot Clinically Determine    Supporting Information:  Signs & Symptoms: Noted pressure ulcer, buttocks, stage III, per doc flowsheets, dated 07/25/11.   Please document in the progress notes and discharge summary if pressure ulcer is  present, and if so, if present on admission.  Treatment: Per doc flowsheet, foam in place.   Reviewed: Additional documentation provided per 6/16 progress notes.                                      Thank You,  Marciano Sequin,  Clinical Documentation Specialist:  Pager: [phone]  Health Information Management [place]
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
## 2011-07-28 DIAGNOSIS — J209 Acute bronchitis, unspecified: Secondary | ICD-10-CM

## 2011-07-28 DIAGNOSIS — G8250 Quadriplegia, unspecified: Secondary | ICD-10-CM

## 2011-07-28 DIAGNOSIS — R000 Tachycardia, unspecified: Secondary | ICD-10-CM

## 2011-07-28 DIAGNOSIS — R0789 Other chest pain: Secondary | ICD-10-CM

## 2011-07-28 MED ORDER — FUROSEMIDE 20 MG PO TABS
20.0000 mg | ORAL_TABLET | Freq: Every day | ORAL | Status: DC
  Administered 2011-07-28 – 2011-07-30 (×3): 20 mg via ORAL
  Filled 2011-07-28 (×3): qty 1

## 2011-07-28 MED ORDER — LEVOFLOXACIN 500 MG PO TABS
500.0000 mg | ORAL_TABLET | Freq: Every day | ORAL | Status: DC
  Administered 2011-07-28 – 2011-07-29 (×2): 500 mg via ORAL
  Filled 2011-07-28 (×3): qty 1
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
## 2011-07-28 NOTE — Progress Notes (Addendum)
Subjective: Patient is awake and alert. Her only complaint is pain in the chest wall area. She states that this pain in the chest wall has been occurring for more than a month. She states that the pain is no different today than it has been. And the pain is occurring diffusely over the anterior chest wall area. The pain is increased with palpation of the chest wall.  Objective: Filed Vitals:   07/28/11 0844 07/28/11 1105 07/28/11 1410 07/28/11 1451  BP:   197/85 155/88  Pulse: 82 85 98 102  Temp:   99.3 F (37.4 C) 99.1 F (37.3 C)  TempSrc:   Oral   Resp: 18 16 20 18   Height:      Weight:      SpO2: 100% 100% 100%    Weight change:   Intake/Output Summary (Last 24 hours) at 07/28/11 1527 Last data filed at 07/28/11 1318  Gross per 24 hour  Intake   1560 ml  Output   6200 ml  Net  -4640 ml    General: Alert, awake, oriented x3, in no acute distress. She is quadriplegic and has movement only of her shoulders. However she is able to speak with her valve in place in her trach. HEENT: [place]/AT PEERL, EOMI Neck: Trachea midline, with tracheostomy noted. They are  no masses, no thyromegal,y no JVD, no carotid bruit OROPHARYNX:  Moist, No exudate/ erythema/lesions.  Heart: Regular rate and rhythm, without murmurs, rubs, gallops, PMI non-displaced, no heaves or thrills on palpation.  Lungs: Clear to auscultation, no wheezing or rhonchi noted. Abdomen: Obese, soft, nontender, nondistended, positive bowel sounds, no masses no hepatosplenomegaly noted..  Neurological: Patient fully awake and alert. She is able to answer questions appropriately and engage in conversation in a logical and reasonable manner. Skin: 3 small areas between her thighs which are stage II. He appear to be secondary to friction between her legs.   Lab Results:  Basename 07/26/11 0500  NA 139  K 4.3  CL 105  CO2 27  GLUCOSE 126*  BUN 32*  CREATININE 0.87  CALCIUM 8.5  MG --  PHOS --   No results found for  this basename: AST:2,ALT:2,ALKPHOS:2,BILITOT:2,PROT:2,ALBUMIN:2 in the last 72 hours No results found for this basename: LIPASE:2,AMYLASE:2 in the last 72 hours  Basename 07/26/11 0500  WBC 7.3  NEUTROABS --  HGB 8.8*  HCT 29.8*  MCV 79.0  PLT 154    Basename 07/25/11 2020  CKTOTAL 78  CKMB 2.2  CKMBINDEX --  TROPONINI <0.30   No components found with this basename: POCBNP:3 No results found for this basename: DDIMER:2 in the last 72 hours No results found for this basename: HGBA1C:2 in the last 72 hours No results found for this basename: CHOL:2,HDL:2,LDLCALC:2,TRIG:2,CHOLHDL:2,LDLDIRECT:2 in the last 72 hours No results found for this basename: TSH,T4TOTAL,FREET3,T3FREE,THYROIDAB in the last 72 hours No results found for this basename: VITAMINB12:2,FOLATE:2,FERRITIN:2,TIBC:2,IRON:2,RETICCTPCT:2 in the last 72 hours  Micro Results: Recent Results (from the past 240 hour(s))  URINE CULTURE     Status: Normal   Collection Time   07/24/11 10:13 PM      Component Value Range Status Comment   Specimen Description URINE, CATHETERIZED   Final    Special Requests NONE   Final    Culture  Setup Time [id number]   Final    Colony Count >=100,000 COLONIES/ML   Final    Culture     Final    Value: Multiple bacterial morphotypes present, none predominant. Suggest appropriate  recollection if clinically indicated.   Report Status 07/26/2011 FINAL   Final   MRSA PCR SCREENING     Status: Normal   Collection Time   07/25/11  4:30 AM      Component Value Range Status Comment   MRSA by PCR NEGATIVE  NEGATIVE Final     Studies/Results: Ct Angio Chest W/cm &/or Wo Cm  07/25/2011  *RADIOLOGY REPORT*  Clinical Data: Chest pain  CT ANGIOGRAPHY CHEST  Technique:  Multidetector CT imaging of the chest using the standard protocol during bolus administration of intravenous contrast. Multiplanar reconstructed images including MIPs were obtained and reviewed to evaluate the vascular anatomy.   Contrast: OMNIPAQUE IOHEXOL 350 MG/ML SOLN  Comparison: 07/24/2011 radiograph  Findings: Degraded by unconventional positioning and streak artifact from the patient's extremities.  No pulmonary arterial branch filling defect identified.  Cardiomegaly.  Aorta is of normal caliber with scattered atherosclerosis.  No pleural or pericardial effusion.  Limited images through the upper abdomen show no acute abnormality.  Tracheostomy tube.  Enlarged thyroid gland.  Debris within the main bronchi.  Bibasilar opacities.  No pneumothorax.  Left-sided chest wall Port-A-Cath, with catheter tip in the distal SVC.  There are prominent right chest wall collateral vessels the suggestion that the right brachial cephalic vein may be occluded.  Curvature of the spine.  No acute osseous finding.  IMPRESSION: Degraded by streak artifact from the extremity positioning.  No main or lobar branch pulmonary embolism identified.  Poorly evaluated segmental branches.  Tracheostomy tube in place.  There is debris within the main bronchi and by basilar opacities.  This may represent a combination of aspiration pneumonitis, infection, and atelectasis.  Prominent right chest wall collateral vessels suggests right subclavian or brachiocephalic vein stenosis/occlusion.  Original Report Authenticated By: Waneta Martins, M.D.   Dg Chest Portable 1 View  07/24/2011  *RADIOLOGY REPORT*  Clinical Data: Shortness of breath.  PORTABLE CHEST - 1 VIEW  Comparison: 11/05/2010  Findings: Degraded by rotation and further limited by the lower lungs/hemidiaphragms be excluded from the image.  Cardiomegaly. Tracheostomy tube.  Left chest wall Port-A-Cath with tip projecting over the mid SVC.  Bibasilar opacities and probable layering pleural effusions are partially imaged. No pneumothorax.  IMPRESSION: Degraded by rotation/positioning.  Bibasilar opacities may reflect atelectasis, infiltrate, or aspiration.  Probable associated small pleural  effusions.  Original Report Authenticated By: Waneta Martins, M.D.    Medications: I have reviewed the patient's current medications. Scheduled Meds:    . clonazePAM  0.5 mg Oral BID  . docusate sodium  100 mg Oral BID  . enoxaparin  40 mg Subcutaneous Q24H  . feeding supplement  30 mL Oral TID WC  . ferrous sulfate  325 mg Oral Q breakfast  . levofloxacin  500 mg Oral q1800  . multivitamin with minerals  1 tablet Oral Daily  . nutrition supplement  1 packet Oral BID BM  . OLANZapine  15 mg Oral QHS  . omega-3 acid ethyl esters  2 g Oral BID WC  . pantoprazole  40 mg Oral Q1200  . polyethylene glycol  17 g Oral q morning - 10a  . polyvinyl alcohol  1 drop Both Eyes QID  . traZODone  100 mg Oral QHS  . venlafaxine  100 mg Oral BID  . DISCONTD: ceFEPime (MAXIPIME) IV  1 g Intravenous Q12H  . DISCONTD: levofloxacin (LEVAQUIN) IV  500 mg Intravenous Q24H  . DISCONTD: vancomycin  1,000 mg Intravenous Q12H  Continuous Infusions:    . sodium chloride 150 mL/hr at 07/27/11 2319   PRN Meds:.acetaminophen, acetaminophen, ibuprofen, levalbuterol, ondansetron (ZOFRAN) IV, ondansetron, oxybutynin, oxyCODONE, senna, traZODone Assessment/Plan: Patient Active Hospital Problem List: SIRS (systemic inflammatory response syndrome) (07/25/2011)   Assessment: Resolved    Healthcare-associated pneumonia/Tracheobronchitis, acute(07/25/2011)   Assessment: Patient is being treated for healthcare associated pneumonia versus pneumonitis. She's on day #2 appropriate antibiotics.cultures have shown no evidence of MRSA. We'll de-escalate antibiotics to include his Levaquin and will discontinue vancomycin and cefepime   Acute respiratory failure with hypoxia (07/25/2011)   Assessment: Patient is satting 100% on 28% trach collar. Asked respiratory to try to wean her to room air.    UTI (lower urinary tract infection) (07/25/2011)   Assessment: The urine culture shows multiple bacterial morphotypes which  I suspect is a reflection of her chronic Foley catheter rather than a pathological process.   Anemia of chronic disease (07/25/2011)   Assessment: Patient's hemoglobin at her baseline. I suspect she was somewhat hemoconcentrated upon arrival to the emergency room.    Schizophrenia (07/25/2011)   Assessment: Continue clonazepam, Effexor and trazodone   Wounds (07/26/2011)   Assessment: Patient has a small area stage II wound on right upper thigh and 3 small areas between her legs where they are rubbing together. Presently they have Mepilex. applied for local wound management. Will get WOC to evaluate and give recommendations.      LOS: 4 days

## 2011-07-29 DIAGNOSIS — J209 Acute bronchitis, unspecified: Secondary | ICD-10-CM

## 2011-07-29 DIAGNOSIS — R0789 Other chest pain: Secondary | ICD-10-CM

## 2011-07-29 DIAGNOSIS — L8993 Pressure ulcer of unspecified site, stage 3: Secondary | ICD-10-CM

## 2011-07-29 DIAGNOSIS — G8250 Quadriplegia, unspecified: Secondary | ICD-10-CM

## 2011-07-29 NOTE — Progress Notes (Signed)
Subjective: Patient is awake and alert.  No new complaints today.  Interval history: The patient had cefepime and vancomycin discontinued yesterday she's had no fevers or any new complaints overnight.  Objective: Filed Vitals:   07/29/11 0015 07/29/11 0410 07/29/11 0603 07/29/11 1000  BP:   118/66   Pulse: 99 92 94 81  Temp:   98.4 F (36.9 C)   TempSrc:   Oral   Resp: 16 18 18 18   Height:      Weight:      SpO2: 100% 100% 100% 100%   Weight change:   Intake/Output Summary (Last 24 hours) at 07/29/11 1241 Last data filed at 07/29/11 1100  Gross per 24 hour  Intake   1400 ml  Output   6625 ml  Net  -5225 ml    General: Alert, awake, oriented x3, in no acute distress. She is quadriplegic and has movement only of her shoulders. However she is able to speak with her valve in place in her trach. HEENT: [place]/AT PEERL, EOMI Neck: Trachea midline, with tracheostomy noted. They are  no masses, no thyromegal,y no JVD, no carotid bruit OROPHARYNX:  Moist, No exudate/ erythema/lesions.  Heart: Regular rate and rhythm, without murmurs, rubs, gallops, PMI non-displaced, no heaves or thrills on palpation.  Lungs: Clear to auscultation, no wheezing or rhonchi noted. Abdomen: Obese, soft, nontender, nondistended, positive bowel sounds, no masses no hepatosplenomegaly noted..  Neurological: Patient fully awake and alert. She is able to answer questions appropriately and engage in conversation in a logical and reasonable manner. Skin: Patient has a stage III pressure ulcer at the superior posterior thigh which was present on admission. She also has some denuded areas medially between the thighs which are likely secondary to a friction from the skin on skin and also from Foley catheter.   Lab Results: No results found for this basename: NA:2,K:2,CL:2,CO2:2,GLUCOSE:2,BUN:2,CREATININE:2,CALCIUM:2,MG:2,PHOS:2 in the last 72 hours No results found for this basename:  AST:2,ALT:2,ALKPHOS:2,BILITOT:2,PROT:2,ALBUMIN:2 in the last 72 hours No results found for this basename: LIPASE:2,AMYLASE:2 in the last 72 hours No results found for this basename: WBC:2,NEUTROABS:2,HGB:2,HCT:2,MCV:2,PLT:2 in the last 72 hours No results found for this basename: CKTOTAL:3,CKMB:3,CKMBINDEX:3,TROPONINI:3 in the last 72 hours No components found with this basename: POCBNP:3 No results found for this basename: DDIMER:2 in the last 72 hours No results found for this basename: HGBA1C:2 in the last 72 hours No results found for this basename: CHOL:2,HDL:2,LDLCALC:2,TRIG:2,CHOLHDL:2,LDLDIRECT:2 in the last 72 hours No results found for this basename: TSH,T4TOTAL,FREET3,T3FREE,THYROIDAB in the last 72 hours No results found for this basename: VITAMINB12:2,FOLATE:2,FERRITIN:2,TIBC:2,IRON:2,RETICCTPCT:2 in the last 72 hours  Micro Results: Recent Results (from the past 240 hour(s))  URINE CULTURE     Status: Normal   Collection Time   07/24/11 10:13 PM      Component Value Range Status Comment   Specimen Description URINE, CATHETERIZED   Final    Special Requests NONE   Final    Culture  Setup Time [id number]   Final    Colony Count >=100,000 COLONIES/ML   Final    Culture     Final    Value: Multiple bacterial morphotypes present, none predominant. Suggest appropriate recollection if clinically indicated.   Report Status 07/26/2011 FINAL   Final   MRSA PCR SCREENING     Status: Normal   Collection Time   07/25/11  4:30 AM      Component Value Range Status Comment   MRSA by PCR NEGATIVE  NEGATIVE Final     Studies/Results:  Ct Angio Chest W/cm &/or Wo Cm  07/25/2011  *RADIOLOGY REPORT*  Clinical Data: Chest pain  CT ANGIOGRAPHY CHEST  Technique:  Multidetector CT imaging of the chest using the standard protocol during bolus administration of intravenous contrast. Multiplanar reconstructed images including MIPs were obtained and reviewed to evaluate the vascular anatomy.   Contrast: OMNIPAQUE IOHEXOL 350 MG/ML SOLN  Comparison: 07/24/2011 radiograph  Findings: Degraded by unconventional positioning and streak artifact from the patient's extremities.  No pulmonary arterial branch filling defect identified.  Cardiomegaly.  Aorta is of normal caliber with scattered atherosclerosis.  No pleural or pericardial effusion.  Limited images through the upper abdomen show no acute abnormality.  Tracheostomy tube.  Enlarged thyroid gland.  Debris within the main bronchi.  Bibasilar opacities.  No pneumothorax.  Left-sided chest wall Port-A-Cath, with catheter tip in the distal SVC.  There are prominent right chest wall collateral vessels the suggestion that the right brachial cephalic vein may be occluded.  Curvature of the spine.  No acute osseous finding.  IMPRESSION: Degraded by streak artifact from the extremity positioning.  No main or lobar branch pulmonary embolism identified.  Poorly evaluated segmental branches.  Tracheostomy tube in place.  There is debris within the main bronchi and by basilar opacities.  This may represent a combination of aspiration pneumonitis, infection, and atelectasis.  Prominent right chest wall collateral vessels suggests right subclavian or brachiocephalic vein stenosis/occlusion.  Original Report Authenticated By: Waneta Martins, M.D.   Dg Chest Portable 1 View  07/24/2011  *RADIOLOGY REPORT*  Clinical Data: Shortness of breath.  PORTABLE CHEST - 1 VIEW  Comparison: 11/05/2010  Findings: Degraded by rotation and further limited by the lower lungs/hemidiaphragms be excluded from the image.  Cardiomegaly. Tracheostomy tube.  Left chest wall Port-A-Cath with tip projecting over the mid SVC.  Bibasilar opacities and probable layering pleural effusions are partially imaged. No pneumothorax.  IMPRESSION: Degraded by rotation/positioning.  Bibasilar opacities may reflect atelectasis, infiltrate, or aspiration.  Probable associated small pleural  effusions.  Original Report Authenticated By: Waneta Martins, M.D.    Medications: I have reviewed the patient's current medications. Scheduled Meds:    . clonazePAM  0.5 mg Oral BID  . docusate sodium  100 mg Oral BID  . enoxaparin  40 mg Subcutaneous Q24H  . feeding supplement  30 mL Oral TID WC  . ferrous sulfate  325 mg Oral Q breakfast  . furosemide  20 mg Oral Daily  . levofloxacin  500 mg Oral q1800  . multivitamin with minerals  1 tablet Oral Daily  . nutrition supplement  1 packet Oral BID BM  . OLANZapine  15 mg Oral QHS  . omega-3 acid ethyl esters  2 g Oral BID WC  . pantoprazole  40 mg Oral Q1200  . polyethylene glycol  17 g Oral q morning - 10a  . polyvinyl alcohol  1 drop Both Eyes QID  . traZODone  100 mg Oral QHS  . venlafaxine  100 mg Oral BID  . DISCONTD: ceFEPime (MAXIPIME) IV  1 g Intravenous Q12H  . DISCONTD: levofloxacin (LEVAQUIN) IV  500 mg Intravenous Q24H  . DISCONTD: vancomycin  1,000 mg Intravenous Q12H   Continuous Infusions:    . sodium chloride 20 mL/hr (07/28/11 1700)   PRN Meds:.acetaminophen, acetaminophen, ibuprofen, levalbuterol, ondansetron (ZOFRAN) IV, ondansetron, oxybutynin, oxyCODONE, senna, traZODone Assessment/Plan: Patient Active Hospital Problem List: SIRS (systemic inflammatory response syndrome) (07/25/2011)   Assessment: Resolved    Healthcare-associated pneumonia/Tracheobronchitis, acute(07/25/2011)  Assessment: Patient is being treated for healthcare associated pneumonia versus pneumonitis. She's on day #4 of Levaquin. Vancomycin and cefepime have been discontinued.  Acute respiratory failure with hypoxia (07/25/2011)   Assessment: Patient is satting 100% on 28% trach collar. Asked respiratory to try to wean her to room air.    UTI (lower urinary tract infection) (07/25/2011)   Assessment: The urine culture shows multiple bacterial morphotypes which I suspect is a reflection of her chronic Foley catheter rather than a  pathological process.   Anemia of chronic disease (07/25/2011)   Assessment: Patient's hemoglobin at her baseline. I suspect she was somewhat hemoconcentrated upon arrival to the emergency room.    Schizophrenia (07/25/2011)   Assessment: Continue clonazepam, Effexor and trazodone   Wounds (07/26/2011)   Assessment: Patient has a  stage III wound on right upper thigh and 3 small areas between her legs where they are rubbing together. Presently they have Mepilex. applied for local wound management. Will get WOC to evaluate and give recommendations.     Anticipate DC to skilled facility tomorrow.  LOS: 5 days

## 2011-07-30 DIAGNOSIS — L8993 Pressure ulcer of unspecified site, stage 3: Secondary | ICD-10-CM

## 2011-07-30 DIAGNOSIS — J209 Acute bronchitis, unspecified: Secondary | ICD-10-CM

## 2011-07-30 DIAGNOSIS — G8250 Quadriplegia, unspecified: Secondary | ICD-10-CM

## 2011-07-30 LAB — DIFFERENTIAL
Eosinophils Absolute: 0.3 10*3/uL (ref 0.0–0.7)
Eosinophils Relative: 4 % (ref 0–5)
Lymphocytes Relative: 31 % (ref 12–46)
Lymphs Abs: 2.4 10*3/uL (ref 0.7–4.0)
Monocytes Absolute: 0.5 10*3/uL (ref 0.1–1.0)
Monocytes Relative: 7 % (ref 3–12)

## 2011-07-30 LAB — BASIC METABOLIC PANEL
CO2: 32 mEq/L (ref 19–32)
Calcium: 9 mg/dL (ref 8.4–10.5)
Chloride: 101 mEq/L (ref 96–112)
Glucose, Bld: 119 mg/dL — ABNORMAL HIGH (ref 70–99)
Sodium: 141 mEq/L (ref 135–145)

## 2011-07-30 LAB — CBC
HCT: 31.2 % — ABNORMAL LOW (ref 36.0–46.0)
MCH: 23.7 pg — ABNORMAL LOW (ref 26.0–34.0)
MCV: 80.2 fL (ref 78.0–100.0)
Platelets: 167 10*3/uL (ref 150–400)
RBC: 3.89 MIL/uL (ref 3.87–5.11)

## 2011-07-30 MED ORDER — HEPARIN SOD (PORK) LOCK FLUSH 100 UNIT/ML IV SOLN
500.0000 [IU] | INTRAVENOUS | Status: AC | PRN
  Administered 2011-07-30: 500 [IU]

## 2011-07-30 MED ORDER — ENOXAPARIN SODIUM 40 MG/0.4ML ~~LOC~~ SOLN: 40.0000 mg | SUBCUTANEOUS | Status: DC

## 2011-07-30 MED ORDER — HYDROCODONE-ACETAMINOPHEN 5-500 MG PO TABS: 2.0000 | ORAL_TABLET | Freq: Four times a day (QID) | ORAL | Status: DC | PRN

## 2011-07-30 MED ORDER — JUVEN PO PACK: 1.0000 | PACK | Freq: Two times a day (BID) | ORAL | Status: DC

## 2011-07-30 MED ORDER — LEVOFLOXACIN 500 MG PO TABS: 500.0000 mg | ORAL_TABLET | Freq: Every day | ORAL | Status: AC

## 2011-07-30 NOTE — Discharge Summary (Signed)
[name] MRN: [id number] DOB/AGE: 03/14/1965 46 y.o.  Admit date: 07/24/2011 Discharge date: 07/30/2011  Primary Care Physician:  Terald Sleeper, MD   Discharge Diagnoses:   Patient Active Problem List  Diagnosis  . SIRS (systemic inflammatory response syndrome)  . Healthcare-associated pneumonia  . Acute respiratory failure with hypoxia  . UTI (lower urinary tract infection)  . Anemia of chronic disease  . Tracheostomy in place  . Tracheobronchitis, acute or subacute  . Neurogenic bladder with chronic Foley catheter  . Quadriplegia  . Schizophrenia  . Anxiety disorder    DISCHARGE MEDICATION: Medication List  As of 07/30/2011 12:33 PM   TAKE these medications         acetaminophen 325 MG tablet   Commonly known as: TYLENOL   Take 650 mg by mouth every 4 (four) hours as needed. For pain      albuterol (2.5 MG/3ML) 0.083% nebulizer solution   Commonly known as: PROVENTIL   Take 2.5 mg by nebulization every 6 (six) hours.      CERTA-VITE PO   Take 1 tablet by mouth every morning.      clonazePAM 0.5 MG tablet   Commonly known as: KLONOPIN   Take 0.5 mg by mouth 2 (two) times daily.      docusate sodium 100 MG capsule   Commonly known as: COLACE   Take 100 mg by mouth 2 (two) times daily.      enoxaparin 40 MG/0.4ML injection   Commonly known as: LOVENOX   Inject 0.4 mLs (40 mg total) into the skin daily.      feeding supplement Liqd   Take 30 mLs by mouth 3 (three) times daily with meals.      ferrous sulfate 325 (65 FE) MG tablet   Take 325 mg by mouth daily with breakfast.      furosemide 20 MG tablet   Commonly known as: LASIX   Take 20 mg by mouth daily.      guaiFENesin 600 MG 12 hr tablet   Commonly known as: MUCINEX   Take 600 mg by mouth 2 (two) times daily as needed. CONGESTION      HYDROcodone-acetaminophen 5-500 MG per tablet   Commonly known as: VICODIN   Take 2 tablets by mouth every 6 (six) hours as needed. For pain     ipratropium 0.02 % nebulizer solution   Commonly known as: ATROVENT   Take 500 mcg by nebulization every 6 (six) hours.      levofloxacin 500 MG tablet   Commonly known as: LEVAQUIN   Take 1 tablet (500 mg total) by mouth daily at 6 PM.      nutrition supplement Pack   Take 1 packet by mouth 2 (two) times daily between meals.      OLANZapine 15 MG tablet   Commonly known as: ZYPREXA   Take 15 mg by mouth at bedtime.      omega-3 acid ethyl esters 1 G capsule   Commonly known as: LOVAZA   Take 2 g by mouth 2 (two) times daily.      omeprazole 20 MG capsule   Commonly known as: PRILOSEC   Take 20 mg by mouth 2 (two) times daily.      OSCAL 500/200 D-3 500-200 MG-UNIT per tablet   Generic drug: calcium-vitamin D   Take 1 tablet by mouth every morning.      oxybutynin 5 MG tablet   Commonly known as: DITROPAN   Take 5 mg  by mouth 2 (two) times daily as needed. BLADDER SPASMS      polycarbophil 625 MG tablet   Commonly known as: FIBERCON   Take 625 mg by mouth every 6 (six) hours.      polyethylene glycol packet   Commonly known as: MIRALAX / GLYCOLAX   Take 17 g by mouth every morning.      senna 8.6 MG Tabs   Commonly known as: SENOKOT   Take 2 tablets by mouth daily as needed. For constipation      SYSTANE 0.4-0.3 % Soln   Generic drug: Polyethyl Glycol-Propyl Glycol   Place 1 drop into both eyes 4 (four) times daily.      traZODone 100 MG tablet   Commonly known as: DESYREL   Take 100 mg by mouth at bedtime.      traZODone 50 MG tablet   Commonly known as: DESYREL   Take 50 mg by mouth at bedtime as needed. Take if 100mg  dose of trazodone is ineffective for insomnia in 1 hour      venlafaxine 100 MG tablet   Commonly known as: EFFEXOR   Take 100 mg by mouth 2 (two) times daily.      vitamin C 500 MG tablet   Commonly known as: ASCORBIC ACID   Take 500 mg by mouth every morning.              Consults:     SIGNIFICANT DIAGNOSTIC STUDIES:  Ct  Angio Chest W/cm &/or Wo Cm  07/25/2011  *RADIOLOGY REPORT*  Clinical Data: Chest pain  CT ANGIOGRAPHY CHEST  Technique:  Multidetector CT imaging of the chest using the standard protocol during bolus administration of intravenous contrast. Multiplanar reconstructed images including MIPs were obtained and reviewed to evaluate the vascular anatomy.  Contrast: OMNIPAQUE IOHEXOL 350 MG/ML SOLN  Comparison: 07/24/2011 radiograph  Findings: Degraded by unconventional positioning and streak artifact from the patient's extremities.  No pulmonary arterial branch filling defect identified.  Cardiomegaly.  Aorta is of normal caliber with scattered atherosclerosis.  No pleural or pericardial effusion.  Limited images through the upper abdomen show no acute abnormality.  Tracheostomy tube.  Enlarged thyroid gland.  Debris within the main bronchi.  Bibasilar opacities.  No pneumothorax.  Left-sided chest wall Port-A-Cath, with catheter tip in the distal SVC.  There are prominent right chest wall collateral vessels the suggestion that the right brachial cephalic vein may be occluded.  Curvature of the spine.  No acute osseous finding.  IMPRESSION: Degraded by streak artifact from the extremity positioning.  No main or lobar branch pulmonary embolism identified.  Poorly evaluated segmental branches.  Tracheostomy tube in place.  There is debris within the main bronchi and by basilar opacities.  This may represent a combination of aspiration pneumonitis, infection, and atelectasis.  Prominent right chest wall collateral vessels suggests right subclavian or brachiocephalic vein stenosis/occlusion.  Original Report Authenticated By: Waneta Martins, M.D.   Dg Chest Portable 1 View  07/24/2011  *RADIOLOGY REPORT*  Clinical Data: Shortness of breath.  PORTABLE CHEST - 1 VIEW  Comparison: 11/05/2010  Findings: Degraded by rotation and further limited by the lower lungs/hemidiaphragms be excluded from the image.   Cardiomegaly. Tracheostomy tube.  Left chest wall Port-A-Cath with tip projecting over the mid SVC.  Bibasilar opacities and probable layering pleural effusions are partially imaged. No pneumothorax.  IMPRESSION: Degraded by rotation/positioning.  Bibasilar opacities may reflect atelectasis, infiltrate, or aspiration.  Probable associated small pleural effusions.  Original Report Authenticated By: Waneta Martins, M.D.        Recent Results (from the past 240 hour(s))  URINE CULTURE     Status: Normal   Collection Time   07/24/11 10:13 PM      Component Value Range Status Comment   Specimen Description URINE, CATHETERIZED   Final    Special Requests NONE   Final    Culture  Setup Time [id number]   Final    Colony Count >=100,000 COLONIES/ML   Final    Culture     Final    Value: Multiple bacterial morphotypes present, none predominant. Suggest appropriate recollection if clinically indicated.   Report Status 07/26/2011 FINAL   Final   MRSA PCR SCREENING     Status: Normal   Collection Time   07/25/11  4:30 AM      Component Value Range Status Comment   MRSA by PCR NEGATIVE  NEGATIVE Final     BRIEF ADMITTING H & P: Patient is a 46 year old female with a history of chronic respiratory failure, tracheostomy who was transferred to the hospital because of persistent productive cough. Patient is also having shortness of breath and became febrile teslas sent to the hospital for further evaluation and management. The patient was admitted for further evaluation and management.   Hospital Course:  Present on Admission: .SIRS (systemic inflammatory response syndrome): Patient had physiology initially which is consistent with systemic inflammatory response syndrome. However within 24 hours the patient's clinical status had improved significantly and clinical examination as well as diagnostic studies showed improvement in her symptoms.   .Healthcare-associated pneumonia: Patient chest x-ray  was consistent with pneumonia. Abdomen and a healthcare setting she was treated for healthcare acquired pneumonia with triple antibiotics including cefepime, vancomycin and Levaquin. The patient's secretions were very thick and a sputum sample was unable to be collected. As the patient's clinical status improved her antibiotics and he is related to alone drug of Levaquin. The patient is to complete one more day of Levaquin to complete a full course of therapy of 7 days.   .Acute respiratory failure with hypoxia: The patient had acute on top of chronic respiratory failure. This is felt to be secondary to the increase in the secretions that she had. With good pulmonary toiletry and appropriate therapy the patient is now down to 28% oxygen with O2 sats in adequate range.   Marland KitchenUTI (lower urinary tract infection): Patient had a urinalysis that was initially suggestive of urinary tract infection however the urine culture showed multiple bacterial morphotypes. The patient had already been on antibiotics and thus was treated empirically. She has shown no further signs or symptoms of clinical decompensation.   .Anemia of chronic disease: The patient has anemia of chronic disease however hemoglobin is stable.   .Tracheobronchitis, acute or subacute: The patient initially had copious secretions which were very thick. The patient's secretions decreased markedly with good pulmonary toiletry and hydration. The patient was treated with antibiotics as noted above.   .Neurogenic bladder with chronic Foley catheter: The patient has a chronic W. nonlatex Foley catheter. On the day of discharge this was some leakage of the Foley catheter. The Foley catheter should be replaced however in light of the fact that the patient is returning to the skilled facility today and a nonlatex Foley is nonformulary I will defer to the skilled facility to replace her Foley catheter on today.   .Pressure Ulcers: The patient has several  pressure ulcers including 1. Right  heel healing stage II. 2. Inner thigh/gluteal fold stage II. 3. Right buttocks pink dry scar tissue. All of these present on admission. The patient was seen by wound ostomy care was recommended foam dressing to protect and promote healing to be changed every 5 days or as needed. She also recommended an air mattress to decrease pressure and barrier cream to protect the skin. The patient has heel boots and it's recommended that she continue with dose reduced pressure.  .Schizophrenia: Patient should continue on her chronic medications without interruption   .Anxiety disorder: Patient should continue her chronic medications without interruption.  . Chest wall pain: Patient has generalized chest wall pain. During her hospitalization NSAIDs were applied on a when necessary basis. She's not being discharged with a standing order for NSAIDs however if she continues to have them I would use them judiciously to assist in pain control.  Disposition and Follow-up: Patient is being discharged back to [facility] skilled nursing facility today as a followup of the facility doctor within 3 days.  Discharge Orders    Future Orders Please Complete By Expires   Diet general      Activity as tolerated - No restrictions      Discharge wound care:      Comments:   Foam dressing to pressure ulcer on posterior thigh every 5 days or PRN . Barrier cream to protect skin. Air mattress for air distribution and heel boots to B/L heels.      DISCHARGE EXAM:  General: Alert, awake, oriented x3, in no acute distress. She is quadriplegic and has movement only of her shoulders. However she is able to speak with her valve in place in her trach.  Vital Signs:Blood pressure 146/72, pulse 83, temperature 97.5 F (36.4 C), temperature source Axillary, resp. rate 18, height 4\' 11"  (1.499 m), weight 106.4 kg (234 lb 9.1 oz), SpO2 100.00%. HEENT: Emma/AT PEERL, EOMI  Neck: Trachea midline, with  tracheostomy noted. They are no masses, no thyromegal,y no JVD, no carotid bruit  OROPHARYNX: Moist, No exudate/ erythema/lesions.  Heart: Regular rate and rhythm, without murmurs, rubs, gallops, PMI non-displaced, no heaves or thrills on palpation.  Lungs: Clear to auscultation, no wheezing or rhonchi noted.  Abdomen: Obese, soft, nontender, nondistended, positive bowel sounds, no masses no hepatosplenomegaly noted..  Neurological: Patient fully awake and alert. She is able to answer questions appropriately and engage in conversation in a logical and reasonable manner.  Skin: Patient has a stage II pressure ulcer at the superior posterior thigh which was present on admission. She also has some denuded areas medially between the thighs which are likely secondary to a friction from the skin on skin and also from Foley catheter.    Basename 07/30/11 1115  NA 141  K 3.5  CL 101  CO2 32  GLUCOSE 119*  BUN 31*  CREATININE 0.91  CALCIUM 9.0  MG --  PHOS --   No results found for this basename: AST:2,ALT:2,ALKPHOS:2,BILITOT:2,PROT:2,ALBUMIN:2 in the last 72 hours No results found for this basename: LIPASE:2,AMYLASE:2 in the last 72 hours  Basename 07/30/11 1115  WBC 7.9  NEUTROABS 4.6  HGB 9.2*  HCT 31.2*  MCV 80.2  PLT 167   On the discharge process including face-to-face time approximately 40 minutes Signed: Payden Docter A. 07/30/2011, 12:33 PM

## 2011-07-30 NOTE — Consult Note (Signed)
WOC consult Note Reason for Consult: Consult requested for pressure ulcers.  Pt with quadriplegia and bedridden. Wound type:  Right heel with healing stage 2 .2X.1X.1cm dry scabbed area removes easily, revealing pink dry scar tissue, no odor, drainage or open wound. Inner thigh/gluteal fold with stage 2 wound .2X.2X.1cm pink and dry. Right buttock with pink dry scar tissue, area approx 5X5cm.  Patchy areas of partial thickness skin loss, red and moist.  Pressure Ulcer POA: Yes Dressing procedure/placement/frequency:Foam dressing to protect and promote healing.  Air mattress to decrease pressure.  Pt frequently incontinent; barrier cream to protect skin.  Heels with boots on to reduce pressure. Will not plan to follow further unless re-consulted.  [address] RN, MSN, [employer]  [phone]

## 2011-07-30 NOTE — Progress Notes (Signed)
Pt d/c in stable condition via EMS back to SNF

## 2011-07-30 NOTE — Clinical Social Work Note (Signed)
Pt is ready for discharge today to [facility]. Facility has received discharge summary and is ready for accept pt. Pt and family are agreeable to discharge plan. [facility] will provide transportation to facility. CSW is signing off as no further needs identified.   Dede Query, MSW, Theresia Majors [phone]

## 2011-08-24 ENCOUNTER — Encounter (HOSPITAL_COMMUNITY): Payer: Self-pay | Admitting: *Deleted

## 2011-08-24 ENCOUNTER — Emergency Department (HOSPITAL_COMMUNITY): Payer: Medicare Other

## 2011-08-24 ENCOUNTER — Inpatient Hospital Stay (HOSPITAL_COMMUNITY)
Admission: EM | Admit: 2011-08-24 | Discharge: 2011-08-31 | DRG: 193 | Disposition: A | Discharge status: Skilled Nursing Facility | Payer: Medicare Other | Attending: Internal Medicine | Admitting: Internal Medicine

## 2011-08-24 DIAGNOSIS — Z6841 Body Mass Index (BMI) 40.0 and over, adult: Secondary | ICD-10-CM

## 2011-08-24 DIAGNOSIS — B965 Pseudomonas (aeruginosa) (mallei) (pseudomallei) as the cause of diseases classified elsewhere: Secondary | ICD-10-CM | POA: Diagnosis present

## 2011-08-24 DIAGNOSIS — J9600 Acute respiratory failure, unspecified whether with hypoxia or hypercapnia: Secondary | ICD-10-CM | POA: Diagnosis present

## 2011-08-24 DIAGNOSIS — R0902 Hypoxemia: Secondary | ICD-10-CM | POA: Diagnosis present

## 2011-08-24 DIAGNOSIS — N390 Urinary tract infection, site not specified: Secondary | ICD-10-CM

## 2011-08-24 DIAGNOSIS — G8250 Quadriplegia, unspecified: Secondary | ICD-10-CM | POA: Diagnosis present

## 2011-08-24 DIAGNOSIS — F209 Schizophrenia, unspecified: Secondary | ICD-10-CM | POA: Diagnosis present

## 2011-08-24 DIAGNOSIS — J189 Pneumonia, unspecified organism: Principal | ICD-10-CM | POA: Diagnosis present

## 2011-08-24 DIAGNOSIS — Z930 Tracheostomy status: Secondary | ICD-10-CM

## 2011-08-24 DIAGNOSIS — K5900 Constipation, unspecified: Secondary | ICD-10-CM

## 2011-08-24 DIAGNOSIS — F411 Generalized anxiety disorder: Secondary | ICD-10-CM | POA: Diagnosis present

## 2011-08-24 DIAGNOSIS — N12 Tubulo-interstitial nephritis, not specified as acute or chronic: Secondary | ICD-10-CM | POA: Diagnosis present

## 2011-08-24 DIAGNOSIS — D638 Anemia in other chronic diseases classified elsewhere: Secondary | ICD-10-CM | POA: Diagnosis present

## 2011-08-24 DIAGNOSIS — Z79899 Other long term (current) drug therapy: Secondary | ICD-10-CM

## 2011-08-24 DIAGNOSIS — I959 Hypotension, unspecified: Secondary | ICD-10-CM | POA: Diagnosis present

## 2011-08-24 DIAGNOSIS — E860 Dehydration: Secondary | ICD-10-CM | POA: Diagnosis present

## 2011-08-24 DIAGNOSIS — J9601 Acute respiratory failure with hypoxia: Secondary | ICD-10-CM | POA: Diagnosis present

## 2011-08-24 DIAGNOSIS — F419 Anxiety disorder, unspecified: Secondary | ICD-10-CM

## 2011-08-24 DIAGNOSIS — B964 Proteus (mirabilis) (morganii) as the cause of diseases classified elsewhere: Secondary | ICD-10-CM | POA: Diagnosis present

## 2011-08-24 DIAGNOSIS — R6510 Systemic inflammatory response syndrome (SIRS) of non-infectious origin without acute organ dysfunction: Secondary | ICD-10-CM

## 2011-08-24 DIAGNOSIS — N319 Neuromuscular dysfunction of bladder, unspecified: Secondary | ICD-10-CM | POA: Diagnosis present

## 2011-08-24 LAB — CBC WITH DIFFERENTIAL/PLATELET
HCT: 35.9 % — ABNORMAL LOW (ref 36.0–46.0)
Hemoglobin: 10.9 g/dL — ABNORMAL LOW (ref 12.0–15.0)
Lymphocytes Relative: 38 % (ref 12–46)
MCHC: 30.4 g/dL (ref 30.0–36.0)
Monocytes Absolute: 0.5 10*3/uL (ref 0.1–1.0)
Monocytes Relative: 8 % (ref 3–12)
Neutro Abs: 3.5 10*3/uL (ref 1.7–7.7)
WBC: 7 10*3/uL (ref 4.0–10.5)

## 2011-08-24 LAB — COMPREHENSIVE METABOLIC PANEL
BUN: 46 mg/dL — ABNORMAL HIGH (ref 6–23)
CO2: 33 mEq/L — ABNORMAL HIGH (ref 19–32)
Chloride: 95 mEq/L — ABNORMAL LOW (ref 96–112)
Creatinine, Ser: 1.02 mg/dL (ref 0.50–1.10)
GFR calc non Af Amer: 65 mL/min — ABNORMAL LOW (ref >90)
Glucose, Bld: 82 mg/dL (ref 70–99)
Total Bilirubin: 0.2 mg/dL — ABNORMAL LOW (ref 0.3–1.2)

## 2011-08-24 MED ORDER — VANCOMYCIN HCL IN DEXTROSE 1-5 GM/200ML-% IV SOLN
1000.0000 mg | Freq: Two times a day (BID) | INTRAVENOUS | Status: DC
  Administered 2011-08-25 – 2011-08-28 (×7): 1000 mg via INTRAVENOUS
  Filled 2011-08-24 (×8): qty 200

## 2011-08-24 MED ORDER — CALCIUM POLYCARBOPHIL 625 MG PO TABS
625.0000 mg | ORAL_TABLET | Freq: Four times a day (QID) | ORAL | Status: DC
  Administered 2011-08-25 – 2011-08-31 (×24): 625 mg via ORAL
  Filled 2011-08-24 (×29): qty 1

## 2011-08-24 MED ORDER — ZOLPIDEM TARTRATE 5 MG PO TABS: 5.0000 mg | ORAL_TABLET | Freq: Every evening | ORAL | Status: DC | PRN

## 2011-08-24 MED ORDER — ENOXAPARIN SODIUM 40 MG/0.4ML ~~LOC~~ SOLN
40.0000 mg | SUBCUTANEOUS | Status: DC
  Administered 2011-08-25 – 2011-08-27 (×4): 40 mg via SUBCUTANEOUS
  Filled 2011-08-24 (×6): qty 0.4

## 2011-08-24 MED ORDER — CALCIUM CARBONATE-VITAMIN D 500-200 MG-UNIT PO TABS
1.0000 | ORAL_TABLET | Freq: Every morning | ORAL | Status: DC
  Administered 2011-08-25 – 2011-08-31 (×7): 1 via ORAL
  Filled 2011-08-24 (×7): qty 1

## 2011-08-24 MED ORDER — PRO-STAT SUGAR FREE PO LIQD
30.0000 mL | Freq: Two times a day (BID) | ORAL | Status: DC
  Administered 2011-08-25 – 2011-08-31 (×14): 30 mL via ORAL
  Filled 2011-08-24 (×15): qty 30

## 2011-08-24 MED ORDER — PIPERACILLIN-TAZOBACTAM 3.375 G IVPB
3.3750 g | Freq: Three times a day (TID) | INTRAVENOUS | Status: DC
  Administered 2011-08-25 – 2011-08-31 (×20): 3.375 g via INTRAVENOUS
  Filled 2011-08-24 (×22): qty 50

## 2011-08-24 MED ORDER — GUAIFENESIN ER 600 MG PO TB12
600.0000 mg | ORAL_TABLET | Freq: Two times a day (BID) | ORAL | Status: DC | PRN
  Filled 2011-08-24: qty 1

## 2011-08-24 MED ORDER — SODIUM CHLORIDE 0.9 % IV SOLN
INTRAVENOUS | Status: DC
  Administered 2011-08-24 – 2011-08-26 (×7): via INTRAVENOUS
  Administered 2011-08-27: 500 mL via INTRAVENOUS
  Administered 2011-08-27: 100 mL via INTRAVENOUS
  Administered 2011-08-30: 20:00:00 via INTRAVENOUS

## 2011-08-24 MED ORDER — OMEGA-3-ACID ETHYL ESTERS 1 G PO CAPS
2.0000 g | ORAL_CAPSULE | Freq: Two times a day (BID) | ORAL | Status: DC
  Administered 2011-08-25 – 2011-08-31 (×14): 2 g via ORAL
  Filled 2011-08-24 (×17): qty 2

## 2011-08-24 MED ORDER — VANCOMYCIN HCL IN DEXTROSE 1-5 GM/200ML-% IV SOLN
1000.0000 mg | Freq: Once | INTRAVENOUS | Status: AC
  Administered 2011-08-24: 1000 mg via INTRAVENOUS
  Filled 2011-08-24: qty 200

## 2011-08-24 MED ORDER — TRAZODONE HCL 100 MG PO TABS
100.0000 mg | ORAL_TABLET | Freq: Every day | ORAL | Status: DC
  Administered 2011-08-25 – 2011-08-30 (×7): 100 mg via ORAL
  Filled 2011-08-24 (×8): qty 1

## 2011-08-24 MED ORDER — ALBUTEROL SULFATE (5 MG/ML) 0.5% IN NEBU
2.5000 mg | INHALATION_SOLUTION | Freq: Four times a day (QID) | RESPIRATORY_TRACT | Status: DC
  Administered 2011-08-24 – 2011-08-27 (×11): 2.5 mg via RESPIRATORY_TRACT
  Filled 2011-08-24 (×12): qty 0.5

## 2011-08-24 MED ORDER — CERTA-VITE PO LIQD
5.0000 mL | Freq: Every day | ORAL | Status: DC
  Administered 2011-08-25 – 2011-08-30 (×6): 5 mL via ORAL
  Filled 2011-08-24 (×7): qty 5

## 2011-08-24 MED ORDER — MORPHINE SULFATE 4 MG/ML IJ SOLN
4.0000 mg | Freq: Once | INTRAMUSCULAR | Status: AC
  Administered 2011-08-24: 4 mg via INTRAVENOUS
  Filled 2011-08-24: qty 1

## 2011-08-24 MED ORDER — SENNA 8.6 MG PO TABS
2.0000 | ORAL_TABLET | Freq: Every day | ORAL | Status: DC | PRN
  Filled 2011-08-24: qty 2

## 2011-08-24 MED ORDER — PANTOPRAZOLE SODIUM 40 MG PO TBEC
40.0000 mg | DELAYED_RELEASE_TABLET | Freq: Every day | ORAL | Status: DC
  Administered 2011-08-25 – 2011-08-31 (×8): 40 mg via ORAL
  Filled 2011-08-24 (×8): qty 1

## 2011-08-24 MED ORDER — POLYETHYLENE GLYCOL 3350 17 G PO PACK
17.0000 g | PACK | Freq: Every morning | ORAL | Status: DC
  Administered 2011-08-25 – 2011-08-26 (×2): 17 g via ORAL
  Filled 2011-08-24 (×2): qty 1

## 2011-08-24 MED ORDER — POLYETHYL GLYCOL-PROPYL GLYCOL 0.4-0.3 % OP SOLN: 1.0000 [drp] | Freq: Four times a day (QID) | OPHTHALMIC | Status: DC

## 2011-08-24 MED ORDER — IPRATROPIUM BROMIDE 0.02 % IN SOLN
0.5000 mg | Freq: Four times a day (QID) | RESPIRATORY_TRACT | Status: DC
  Administered 2011-08-25 – 2011-08-31 (×27): 0.5 mg via RESPIRATORY_TRACT
  Filled 2011-08-24 (×28): qty 2.5

## 2011-08-24 MED ORDER — ONDANSETRON HCL 4 MG PO TABS
4.0000 mg | ORAL_TABLET | Freq: Four times a day (QID) | ORAL | Status: DC | PRN
  Filled 2011-08-24: qty 1

## 2011-08-24 MED ORDER — DOCUSATE SODIUM 100 MG PO CAPS
100.0000 mg | ORAL_CAPSULE | Freq: Two times a day (BID) | ORAL | Status: DC
  Administered 2011-08-25 – 2011-08-31 (×14): 100 mg via ORAL
  Filled 2011-08-24 (×15): qty 1

## 2011-08-24 MED ORDER — VANCOMYCIN HCL IN DEXTROSE 1-5 GM/200ML-% IV SOLN
1000.0000 mg | Freq: Once | INTRAVENOUS | Status: AC
  Filled 2011-08-24: qty 200

## 2011-08-24 MED ORDER — ACETAMINOPHEN 325 MG PO TABS
650.0000 mg | ORAL_TABLET | Freq: Four times a day (QID) | ORAL | Status: DC | PRN
  Administered 2011-08-30: 650 mg via ORAL
  Filled 2011-08-24 (×2): qty 2

## 2011-08-24 MED ORDER — OXYCODONE HCL 5 MG PO TABS
5.0000 mg | ORAL_TABLET | ORAL | Status: DC | PRN
  Administered 2011-08-25 – 2011-08-31 (×12): 5 mg via ORAL
  Filled 2011-08-24 (×12): qty 1

## 2011-08-24 MED ORDER — ACETAMINOPHEN 650 MG RE SUPP
650.0000 mg | Freq: Four times a day (QID) | RECTAL | Status: DC | PRN
  Filled 2011-08-24: qty 1

## 2011-08-24 MED ORDER — ALBUTEROL SULFATE (5 MG/ML) 0.5% IN NEBU
2.5000 mg | INHALATION_SOLUTION | RESPIRATORY_TRACT | Status: DC | PRN
  Administered 2011-08-26: 2.5 mg via RESPIRATORY_TRACT

## 2011-08-24 MED ORDER — ONDANSETRON HCL 4 MG/2ML IJ SOLN
4.0000 mg | Freq: Four times a day (QID) | INTRAMUSCULAR | Status: DC | PRN
  Filled 2011-08-24: qty 2

## 2011-08-24 MED ORDER — HYDROMORPHONE HCL PF 1 MG/ML IJ SOLN
0.5000 mg | INTRAMUSCULAR | Status: DC | PRN
  Administered 2011-08-24: 1 mg via INTRAVENOUS
  Administered 2011-08-27: 0.5 mg via INTRAVENOUS
  Administered 2011-08-28 – 2011-08-30 (×4): 1 mg via INTRAVENOUS
  Filled 2011-08-24 (×6): qty 1

## 2011-08-24 MED ORDER — OLANZAPINE 7.5 MG PO TABS
15.0000 mg | ORAL_TABLET | Freq: Every day | ORAL | Status: DC
  Administered 2011-08-25 – 2011-08-30 (×7): 15 mg via ORAL
  Filled 2011-08-24 (×8): qty 2

## 2011-08-24 MED ORDER — CLONAZEPAM 0.5 MG PO TABS
0.5000 mg | ORAL_TABLET | Freq: Two times a day (BID) | ORAL | Status: DC
  Administered 2011-08-25 – 2011-08-31 (×14): 0.5 mg via ORAL
  Filled 2011-08-24 (×14): qty 1

## 2011-08-24 MED ORDER — FERROUS SULFATE 325 (65 FE) MG PO TABS
325.0000 mg | ORAL_TABLET | Freq: Every day | ORAL | Status: DC
  Administered 2011-08-25 – 2011-08-31 (×7): 325 mg via ORAL
  Filled 2011-08-24 (×8): qty 1

## 2011-08-24 MED ORDER — PIPERACILLIN-TAZOBACTAM 3.375 G IVPB
3.3750 g | Freq: Once | INTRAVENOUS | Status: AC
  Administered 2011-08-24: 3.375 g via INTRAVENOUS
  Filled 2011-08-24: qty 50

## 2011-08-24 MED ORDER — IPRATROPIUM BROMIDE 0.02 % IN SOLN
500.0000 ug | Freq: Four times a day (QID) | RESPIRATORY_TRACT | Status: DC
  Administered 2011-08-24: 500 ug via RESPIRATORY_TRACT
  Filled 2011-08-24: qty 2.5

## 2011-08-24 MED ORDER — OXYBUTYNIN CHLORIDE 5 MG PO TABS
5.0000 mg | ORAL_TABLET | Freq: Two times a day (BID) | ORAL | Status: DC
  Administered 2011-08-25 – 2011-08-31 (×14): 5 mg via ORAL
  Filled 2011-08-24 (×15): qty 1

## 2011-08-24 MED ORDER — TRAZODONE HCL 50 MG PO TABS
50.0000 mg | ORAL_TABLET | Freq: Every day | ORAL | Status: DC | PRN
  Filled 2011-08-24: qty 1

## 2011-08-24 MED ORDER — VENLAFAXINE HCL 50 MG PO TABS
100.0000 mg | ORAL_TABLET | Freq: Two times a day (BID) | ORAL | Status: DC
  Administered 2011-08-25 – 2011-08-31 (×14): 100 mg via ORAL
  Filled 2011-08-24 (×15): qty 2

## 2011-08-24 MED ORDER — POLYVINYL ALCOHOL 1.4 % OP SOLN
1.0000 [drp] | Freq: Four times a day (QID) | OPHTHALMIC | Status: DC
  Administered 2011-08-25 – 2011-08-31 (×23): 1 [drp] via OPHTHALMIC
  Filled 2011-08-24 (×3): qty 15

## 2011-08-24 MED ORDER — VITAMIN C 500 MG PO TABS
500.0000 mg | ORAL_TABLET | Freq: Every morning | ORAL | Status: DC
  Administered 2011-08-25 – 2011-08-31 (×7): 500 mg via ORAL
  Filled 2011-08-24 (×7): qty 1

## 2011-08-24 MED ORDER — ALUM & MAG HYDROXIDE-SIMETH 200-200-20 MG/5ML PO SUSP
30.0000 mL | Freq: Four times a day (QID) | ORAL | Status: DC | PRN
  Filled 2011-08-24: qty 30

## 2011-08-24 NOTE — ED Notes (Signed)
Patient ate 95% of her meal at approximately 2030 pm. She was fed by Nurse Tech on duty.

## 2011-08-24 NOTE — ED Notes (Signed)
Assisted pt with drinking ice water; pt drank 2 full cups of water with no difficulty and no distress; RN notified

## 2011-08-24 NOTE — ED Notes (Signed)
Called IV team to access port. Pt refused to let anyone else stick her.

## 2011-08-24 NOTE — Progress Notes (Signed)
ANTIBIOTIC CONSULT NOTE - INITIAL  Pharmacy Consult for Vancomycin/Zosyn Indication: rule out pneumonia  Allergies  Allergen Reactions  . Latex Other (See Comments)    Per [facility]    Patient Measurements: Height: 4' 11.06" (150 cm) Weight: 234 lb 9.1 oz (106.4 kg) IBW/kg (Calculated) : 43.33   Vital Signs: Temp: 98.4 F (36.9 C) (07/12 2003) Temp src: Oral (07/12 2003) BP: 145/80 mmHg (07/12 2003) Pulse Rate: 117  (07/12 2003) Intake/Output from previous day:   Intake/Output from this shift:    Labs:  Basename 08/24/11 1620  WBC 7.0  HGB 10.9*  PLT 165  LABCREA --  CREATININE 1.02   Estimated Creatinine Clearance: 74.5 ml/min (by C-G formula based on Cr of 1.02). No results found for this basename: VANCOTROUGH:2,VANCOPEAK:2,VANCORANDOM:2,GENTTROUGH:2,GENTPEAK:2,GENTRANDOM:2,TOBRATROUGH:2,TOBRAPEAK:2,TOBRARND:2,AMIKACINPEAK:2,AMIKACINTROU:2,AMIKACIN:2, in the last 72 hours   Microbiology: No results found for this or any previous visit (from the past 720 hour(s)).  Medical History: Past Medical History  Diagnosis Date  . Respiratory failure   . Dysphagia   . Contracture of hand joint   . Anemia   . Neurogenic bladder   . Anxiety   . Schizophrenia   . Quadriplegia     Medications:   (Not in a hospital admission) Assessment: 46 y/o female patient admitted with shortness of breath requiring broad spectrum antibiotics for r/o pneumonia. Received 1g vancomycin and 3.375g zosyn in ED. Will adjust for obesity.  Goal of Therapy:  Vancomycin trough level 15-20 mcg/ml  Plan:  Give additional vancomycin 1g for total 2g load then 1g iv q12h. Continue zosyn 3.375g iv q8h and will monitor renal function. Measure antibiotic drug levels at steady [address] PharmD, [place] Pager [phone] 08/24/2011,8:32 PM

## 2011-08-24 NOTE — ED Notes (Signed)
Patient transported to X-ray 

## 2011-08-24 NOTE — ED Notes (Signed)
Per ems- pt from [facility]. Pt was 89% on room air then 95% on 6L. Pt was told that she had an infiltrate in rt lower lobe this morning. Pt brought her own trach supplies if needed. BP 140/80 hr 90. resp 20. Facility denied fever. Pt has also not had a bowel movement in 1 week despite use of suppository at facility.

## 2011-08-24 NOTE — H&P (Signed)
[facility] History and Physical  [name] WUJ:[id number] DOB: 04/01/1965 DOA: 08/24/2011  Referring physician:  ED Physician:  Dr. Leary Roca PCP: Terald Sleeper, MD   Chief Complaint: SOB  HPI: 46 year old female with Quadriplegia from the [facility] SN[facility] who was sent to the ED due to complaints of worsening SOB over the  Past week.  She reports having increased tracheostomy secretions which have been yellow.  She reports having fevers  And chills.  She was found to have decreased Oxygen saturations at the SNF today down to 80% and her oxygen was increased to 6 liters.  The SNF had performed a Chest X-ray and found a RLL Pneumonia.  In the ED she was placed on IV Antibiotic therapy to cover HCAP Pneumonia with IV Vanc and Zosyn.  She was referred for medical admission.     Review of Systems:    Past Medical History  Diagnosis Date  . Respiratory failure   . Dysphagia   . Contracture of hand joint   . Anemia   . Neurogenic bladder   . Anxiety   . Schizophrenia   . Quadriplegia    Past Surgical History  Procedure Date  . Tracheostomy   . Gastrostomy w/ feeding tube    Social History:  reports that she has never smoked. She does not have any smokeless tobacco history on file. She reports that she does not drink alcohol or use illicit drugs.  Allergies  Allergen Reactions  . Latex Other (See Comments)    Per MAR    Family History:  Unable to Obtain from the Patient  Prior to Admission medications   Medication Sig Start Date End Date Taking? Authorizing Provider  acetaminophen (TYLENOL) 325 MG tablet Take 650 mg by mouth every 4 (four) hours as needed. For pain   Yes Historical Provider, MD  albuterol (PROVENTIL) (2.5 MG/3ML) 0.083% nebulizer solution Take 2.5 mg by nebulization every 6 (six) hours. With atrovent   Yes Historical Provider, MD  calcium-vitamin D (OSCAL 500/200 D-3) 500-200 MG-UNIT per tablet Take 1 tablet by mouth every morning.   Yes  Historical Provider, MD  clonazePAM (KLONOPIN) 0.5 MG tablet Take 0.5 mg by mouth 2 (two) times daily.   Yes Historical Provider, MD  docusate sodium (COLACE) 100 MG capsule Take 100 mg by mouth 2 (two) times daily.   Yes Historical Provider, MD  feeding supplement (PRO-STAT SUGAR FREE 64) LIQD Take 30 mLs by mouth 2 (two) times daily.    Yes Historical Provider, MD  ferrous sulfate 325 (65 FE) MG tablet Take 325 mg by mouth daily with breakfast.   Yes Historical Provider, MD  furosemide (LASIX) 20 MG tablet Take 20 mg by mouth daily.   Yes Historical Provider, MD  guaiFENesin (MUCINEX) 600 MG 12 hr tablet Take 600 mg by mouth 2 (two) times daily as needed. CONGESTION   Yes Historical Provider, MD  HYDROcodone-acetaminophen (VICODIN) 5-500 MG per tablet Take 2 tablets by mouth every 6 (six) hours as needed. For pain 07/30/11  Yes Altha Harm, MD  ipratropium (ATROVENT) 0.02 % nebulizer solution Take 500 mcg by nebulization every 6 (six) hours. With albuterol   Yes Historical Provider, MD  methocarbamol (ROBAXIN) 500 MG tablet Take 500 mg by mouth every 6 (six) hours as needed. For muscle spasms   Yes Historical Provider, MD  Multiple Vitamins-Minerals (CERTA-VITE PO) Take 1 tablet by mouth every morning.    Yes Historical Provider, MD  OLANZapine (ZYPREXA) 15  MG tablet Take 15 mg by mouth at bedtime.   Yes Historical Provider, MD  omega-3 acid ethyl esters (LOVAZA) 1 G capsule Take 2 g by mouth 2 (two) times daily.   Yes Historical Provider, MD  omeprazole (PRILOSEC) 20 MG capsule Take 20 mg by mouth 2 (two) times daily.   Yes Historical Provider, MD  oxybutynin (DITROPAN) 5 MG tablet Take 5 mg by mouth 2 (two) times daily as needed. BLADDER SPASMS   Yes Historical Provider, MD  polycarbophil (FIBERCON) 625 MG tablet Take 625 mg by mouth every 6 (six) hours.   Yes Historical Provider, MD  Polyethyl Glycol-Propyl Glycol (SYSTANE) 0.4-0.3 % SOLN Place 1 drop into both eyes 4 (four) times daily.     Yes Historical Provider, MD  polyethylene glycol (MIRALAX / GLYCOLAX) packet Take 17 g by mouth every morning.    Yes Historical Provider, MD  senna (SENOKOT) 8.6 MG TABS Take 2 tablets by mouth daily as needed. For constipation   Yes Historical Provider, MD  traZODone (DESYREL) 100 MG tablet Take 50-100 mg by mouth at bedtime. Take 100mg  every night at bedtime.  Take 50mg  tablet at bedtime as needed if 100mg  is ineffective for insomnia in 1 hr   Yes Historical Provider, MD  traZODone (DESYREL) 100 MG tablet Take 100 mg by mouth at bedtime.   Yes Historical Provider, MD  venlafaxine (EFFEXOR) 100 MG tablet Take 100 mg by mouth 2 (two) times daily.   Yes Historical Provider, MD  vitamin C (ASCORBIC ACID) 500 MG tablet Take 500 mg by mouth every morning.    Yes Historical Provider, MD   Physical Exam: Filed Vitals:   08/24/11 1703 08/24/11 1801 08/24/11 1920 08/24/11 2003  BP: 155/94 127/95 166/146 145/80  Pulse: 89 103 112 117  Temp: 97.8 F (36.6 C)  98.4 F (36.9 C) 98.4 F (36.9 C)  TempSrc: Oral  Oral Oral  Resp: 20 18 20 20   SpO2: 100% 100% 94% 96%      Physical Exam:  GEN: Quadriplegic 46 year old Morbidly Obese African American female examined  and in no acute distress; Filed Vitals:   08/24/11 1703 08/24/11 1801 08/24/11 1920 08/24/11 2003  BP: 155/94 127/95 166/146 145/80  Pulse: 89 103 112 117  Temp: 97.8 F (36.6 C)  98.4 F (36.9 C) 98.4 F (36.9 C)  TempSrc: Oral  Oral Oral  Resp: 20 18 20 20   Height:    4' 11.06" (1.5 m)  Weight:    106.4 kg (234 lb 9.1 oz)  SpO2: 100% 100% 94% 96%   Blood pressure 145/80, pulse 117, temperature 98.4 F (36.9 C), temperature source Oral, resp. rate 20, height 4' 11.06" (1.5 m), weight 106.4 kg (234 lb 9.1 oz), SpO2 96.00%. PSYCH: She is alert and oriented x4; does not appear anxious does not appear depressed; affect is normal HEENT: Normocephalic and Atraumatic, Mucous membranes pink; PERRLA; EOM intact; Fundi:  Benign;  No  scleral icterus, Nares: Patent, Oropharynx: Clear,  Fair Dentition, Neck: +Tracheostomy,  FROM, no JVD; Breasts:: Not examined CHEST WALL: No tenderness CHEST: Normal respiration, clear to auscultation bilaterally HEART: Regular rate and rhythm; no murmurs rubs or gallops BACK: No kyphosis or scoliosis; no CVA tenderness ABDOMEN: Positive Bowel Sounds, PEG Tube present clamped, Obese, soft non-tender; no masses, no organomegaly. Rectal Exam: Not done EXTREMITIES: + Contractures Both hands, and  age-appropriate arthropathy of the hands and knees; no cyanosis, clubbing or 2+ Edema BLEs,  Genitalia: not examined PULSES:  2+ and symmetric SKIN:    Decubitus Ulcer Normal hydration no rash or ulceration CNS: Cranial nerves 2-12, + Quadripareisis  Labs on Admission:  Basic Metabolic Panel:  Lab 08/24/11 8469  NA 139  K 4.2  CL 95*  CO2 33*  GLUCOSE 82  BUN 46*  CREATININE 1.02  CALCIUM 9.7  MG --  PHOS --   Liver Function Tests:  Lab 08/24/11 1620  AST 15  ALT 10  ALKPHOS 81  BILITOT 0.2*  PROT 8.5*  ALBUMIN 3.4*   No results found for this basename: LIPASE:5,AMYLASE:5 in the last 168 hours No results found for this basename: AMMONIA:5 in the last 168 hours CBC:  Lab 08/24/11 1620  WBC 7.0  NEUTROABS 3.5  HGB 10.9*  HCT 35.9*  MCV 78.0  PLT 165   Cardiac Enzymes: No results found for this basename: CKTOTAL:5,CKMB:5,CKMBINDEX:5,TROPONINI:5 in the last 168 hours BNP: No components found with this basename: POCBNP:5 CBG: No results found for this basename: GLUCAP:5 in the last 168 hours  Radiological Exams on Admission: Dg Abd 1 View  08/24/2011  *RADIOLOGY REPORT*  Clinical Data: Abdominal pain, nausea and constipation.  ABDOMEN - 1 VIEW  Comparison: 11/01/2010.  Findings: Stool is seen throughout the colon.  Spinal stimulator is in place.  IMPRESSION: Severe constipation.  Original Report Authenticated By: Reyes Ivan, M.D.   Dg Chest Port 1  View  08/24/2011  *RADIOLOGY REPORT*  Clinical Data: Shortness of breath and weakness  PORTABLE CHEST - 1 VIEW  Comparison: 07/24/2011; 11/05/2010; chest CT - 07/25/2011  Findings: Grossly unchanged cardiac silhouette and mediastinal contours.  Stable position of support apparatus.  There is persistent mild elevation of the right hemidiaphragm with grossly unchanged bibasilar heterogeneous opacities, right greater than left.  No neural focal airspace opacities.  No pleural effusion or pneumothorax.  Unchanged bones.  IMPRESSION: Grossly unchanged bibasilar opacities, right greater than left, atelectasis versus infiltrate.  Original Report Authenticated By: Waynard Reeds, M.D.    EKG: Independently reviewed.   Assessment: Principal Problem:  *Healthcare-associated pneumonia Active Problems:  Acute respiratory failure with hypoxia  Hypoxemia  Tracheostomy in place  Neurogenic bladder with chronic Foley catheter  Quadriplegia  Schizophrenia   Plan:        Admitted to Stepdown Unit  IV Antibiotics Vanc, and Zosyn Nebulizer O2 Trach Care PEG Care Foley, Strict I/os IVFs,monitor Lytes.   Reconcile Regular Medications DVT prophylaxis Other plan as per orders.    Code Status: FULL CODE Family Communication:  Disposition Plan:   Ron Parker [facility]   If 7PM-7AM, please contact night-coverage [email or web address] Password [facility] 08/24/2011, 8:26 PM
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
## 2011-08-25 ENCOUNTER — Encounter (HOSPITAL_COMMUNITY): Payer: Self-pay | Admitting: *Deleted

## 2011-08-25 DIAGNOSIS — D638 Anemia in other chronic diseases classified elsewhere: Secondary | ICD-10-CM

## 2011-08-25 DIAGNOSIS — J189 Pneumonia, unspecified organism: Principal | ICD-10-CM

## 2011-08-25 DIAGNOSIS — N319 Neuromuscular dysfunction of bladder, unspecified: Secondary | ICD-10-CM

## 2011-08-25 DIAGNOSIS — J9600 Acute respiratory failure, unspecified whether with hypoxia or hypercapnia: Secondary | ICD-10-CM

## 2011-08-25 LAB — URINALYSIS, ROUTINE W REFLEX MICROSCOPIC
Nitrite: POSITIVE — AB
Protein, ur: 30 mg/dL — AB
Specific Gravity, Urine: 1.006 (ref 1.005–1.030)
Urobilinogen, UA: 0.2 mg/dL (ref 0.0–1.0)

## 2011-08-25 LAB — URINE MICROSCOPIC-ADD ON

## 2011-08-25 LAB — CBC
HCT: 31.3 % — ABNORMAL LOW (ref 36.0–46.0)
Hemoglobin: 9.3 g/dL — ABNORMAL LOW (ref 12.0–15.0)
MCH: 23.3 pg — ABNORMAL LOW (ref 26.0–34.0)
MCV: 78.4 fL (ref 78.0–100.0)
Platelets: 147 10*3/uL — ABNORMAL LOW (ref 150–400)
RBC: 3.99 MIL/uL (ref 3.87–5.11)
WBC: 7.5 10*3/uL (ref 4.0–10.5)

## 2011-08-25 LAB — BASIC METABOLIC PANEL
CO2: 31 mEq/L (ref 19–32)
Chloride: 99 mEq/L (ref 96–112)
Glucose, Bld: 122 mg/dL — ABNORMAL HIGH (ref 70–99)
Potassium: 4.1 mEq/L (ref 3.5–5.1)
Sodium: 138 mEq/L (ref 135–145)

## 2011-08-25 LAB — MRSA PCR SCREENING: MRSA by PCR: NEGATIVE

## 2011-08-25 MED ORDER — LEVOFLOXACIN IN D5W 750 MG/150ML IV SOLN
750.0000 mg | INTRAVENOUS | Status: DC
  Administered 2011-08-25: 750 mg via INTRAVENOUS
  Filled 2011-08-25 (×2): qty 150

## 2011-08-25 MED ORDER — SODIUM CHLORIDE 0.9 % IV BOLUS (SEPSIS)
500.0000 mL | Freq: Once | INTRAVENOUS | Status: AC
  Administered 2011-08-25: 06:00:00 via INTRAVENOUS

## 2011-08-25 MED ORDER — BIOTENE DRY MOUTH MT LIQD
15.0000 mL | Freq: Two times a day (BID) | OROMUCOSAL | Status: DC
  Administered 2011-08-25 – 2011-08-31 (×13): 15 mL via OROMUCOSAL

## 2011-08-25 NOTE — Progress Notes (Signed)
Pts. SBP was running low 70'-80's, pt. Asymptomatic, T. CallahanNP made aware with orders made. 500cc NS bolus infusing, will cont. to monitor pts. BP.

## 2011-08-25 NOTE — Progress Notes (Addendum)
[facility] Progress Note [place] TEAM 1 - Stepdown/ICU TEAM   [name] EXB:[id number] DOB: January 14, 1966 DOA: 08/24/2011 PCP: Terald Sleeper, MD  Assessment/Plan:  RLL Healthcare-associated pneumonia Continue empiric antibiotic therapy - blood cultures pending  Hypotension Likely due to hypovolemia/dehydration with the possibility of early bacteremia - blood cultures pending -  continue volume resuscitation  Acute respiratory failure with hypoxia Much improved at the present time - continue supplemental oxygen via trach  Tracheostomy dependent No evidence of trach complications the present time - able to communicate effectively via Passy-Muir valve  Neurogenic bladder with chronic Foley catheter Urine culture ordered/pending  Normocytic anemia No evidence of acute blood - suspect she has a component of chronic anemia - will follow and investigate further  Quadriplegia  Schizophrenia Appears well compensated at present  Code Status: Full Family Communication:  Disposition Plan: return to SNF  Brief narrative: 46 year old female with quadriplegia from the [facility] SNF who was sent to the ED due to complaints of worsening SOB. She reported having increased tracheostomy secretions which had been yellow. She reported having fevers and chills. She was found to have decreased oxygen saturations at the SNF - down to 80% - and her oxygen was increased to 6 liters. The SNF performed a Chest X-ray and found a RLL Pneumonia.   Consultants: None  Procedures: None  Antibiotics: Zosyn 7/12 Vancomycin 7/12 Levaquin 7/13  HPI/Subjective: Patient is alert and oriented.  She is conversant.  She reports her shortness of breath is consistent.  She states she does not yet feel much better.  She denies chest pain nausea vomiting or abdominal pain.   Objective: Blood pressure 128/72, pulse 107, temperature 98.3 F (36.8 C), temperature source Oral, resp. rate 18,  height 5\' 2"  (1.575 m), weight 105.4 kg (232 lb 5.8 oz), SpO2 94.00%.  Intake/Output Summary (Last 24 hours) at 08/25/11 1019 Last data filed at 08/25/11 0800  Gross per 24 hour  Intake 1732.5 ml  Output   3700 ml  Net -1967.5 ml     Exam: General: No acute respiratory distress at rest Lungs: Bibasilar crackles appreciable but greatest on the right with no wheeze and good air movement throughout a fields - trach intact with no evidence of complications with a Passy-Muir valve in place Cardiovascular: Tachycardic but regular without gallop or rub and no appreciable murmur Abdomen: Overweight, nontender, nondistended, soft, bowel sounds positive, no rebound, no ascites, no appreciable mass Extremities: No significant cyanosis, clubbing, or edema bilateral lower extremities  Data Reviewed: Basic Metabolic Panel:  Lab 08/25/11 1027 08/24/11 1620  NA 138 139  K 4.1 4.2  CL 99 95*  CO2 31 33*  GLUCOSE 122* 82  BUN 42* 46*  CREATININE 1.02 1.02  CALCIUM 8.8 9.7  MG -- --  PHOS -- --   Liver Function Tests:  Lab 08/24/11 1620  AST 15  ALT 10  ALKPHOS 81  BILITOT 0.2*  PROT 8.5*  ALBUMIN 3.4*   CBC:  Lab 08/25/11 0426 08/24/11 1620  WBC 7.5 7.0  NEUTROABS -- 3.5  HGB 9.3* 10.9*  HCT 31.3* 35.9*  MCV 78.4 78.0  PLT 147* 165    Recent Results (from the past 240 hour(s))  CULTURE, BLOOD (ROUTINE X 2)     Status: Normal (Preliminary result)   Collection Time   08/24/11  6:10 PM      Component Value Range Status Comment   Specimen Description BLOOD SHOULDER RIGHT   Final  Special Requests BOTTLES DRAWN AEROBIC AND ANAEROBIC 3CC EA   Final    Culture  Setup Time 08/24/2011 22:07   Final    Culture     Final    Value:        BLOOD CULTURE RECEIVED NO GROWTH TO DATE CULTURE WILL BE HELD FOR 5 DAYS BEFORE ISSUING A FINAL NEGATIVE REPORT   Report Status PENDING   Incomplete   CULTURE, BLOOD (ROUTINE X 2)     Status: Normal (Preliminary result)   Collection Time    08/24/11  6:20 PM      Component Value Range Status Comment   Specimen Description BLOOD SHOULDER RIGHT   Final    Special Requests BOTTLES DRAWN AEROBIC AND ANAEROBIC 5CC EA   Final    Culture  Setup Time 08/24/2011 22:06   Final    Culture     Final    Value:        BLOOD CULTURE RECEIVED NO GROWTH TO DATE CULTURE WILL BE HELD FOR 5 DAYS BEFORE ISSUING A FINAL NEGATIVE REPORT   Report Status PENDING   Incomplete   MRSA PCR SCREENING     Status: Normal   Collection Time   08/25/11  1:46 AM      Component Value Range Status Comment   MRSA by PCR NEGATIVE  NEGATIVE Final      Studies:  Recent x-ray studies have been reviewed in detail by the Attending Physician  Scheduled Meds:  Reviewed in detail by the Attending Physician   Lonia Blood, MD [facility] Office  [phone] Pager [phone]  On-Call/Text Page:      [email or web address]      password [email or web address]  If 7PM-7AM, please contact night-coverage [email or web address] Password [email or web address] 08/25/2011, 10:19 AM   LOS: 1 day

## 2011-08-26 DIAGNOSIS — G8250 Quadriplegia, unspecified: Secondary | ICD-10-CM

## 2011-08-26 DIAGNOSIS — K5900 Constipation, unspecified: Secondary | ICD-10-CM

## 2011-08-26 LAB — CBC
MCH: 23.1 pg — ABNORMAL LOW (ref 26.0–34.0)
MCHC: 29.3 g/dL — ABNORMAL LOW (ref 30.0–36.0)
MCV: 78.7 fL (ref 78.0–100.0)
Platelets: 143 10*3/uL — ABNORMAL LOW (ref 150–400)
RBC: 3.81 MIL/uL — ABNORMAL LOW (ref 3.87–5.11)

## 2011-08-26 LAB — BASIC METABOLIC PANEL
BUN: 35 mg/dL — ABNORMAL HIGH (ref 6–23)
CO2: 30 mEq/L (ref 19–32)
Calcium: 8.4 mg/dL (ref 8.4–10.5)
Creatinine, Ser: 0.98 mg/dL (ref 0.50–1.10)
GFR calc non Af Amer: 68 mL/min — ABNORMAL LOW (ref >90)
Glucose, Bld: 103 mg/dL — ABNORMAL HIGH (ref 70–99)

## 2011-08-26 MED ORDER — LACTULOSE 10 GM/15ML PO SOLN
20.0000 g | Freq: Two times a day (BID) | ORAL | Status: DC
  Administered 2011-08-26 – 2011-08-31 (×11): 20 g via ORAL
  Filled 2011-08-26 (×12): qty 30

## 2011-08-26 MED ORDER — HYDRALAZINE HCL 20 MG/ML IJ SOLN
5.0000 mg | Freq: Once | INTRAMUSCULAR | Status: AC
  Administered 2011-08-26: 5 mg via INTRAVENOUS
  Filled 2011-08-26: qty 0.25

## 2011-08-26 MED ORDER — POLYETHYLENE GLYCOL 3350 17 G PO PACK
17.0000 g | PACK | Freq: Two times a day (BID) | ORAL | Status: DC
  Administered 2011-08-26 – 2011-08-31 (×10): 17 g via ORAL
  Filled 2011-08-26 (×11): qty 1

## 2011-08-26 MED ORDER — LEVOFLOXACIN 750 MG PO TABS
750.0000 mg | ORAL_TABLET | Freq: Every day | ORAL | Status: DC
  Administered 2011-08-26 – 2011-08-31 (×6): 750 mg via ORAL
  Filled 2011-08-26 (×6): qty 1

## 2011-08-26 MED ORDER — SORBITOL 70 % SOLN
960.0000 mL | TOPICAL_OIL | Freq: Every day | ORAL | Status: DC | PRN
  Filled 2011-08-26: qty 240

## 2011-08-26 NOTE — ED Provider Notes (Signed)
I saw and evaluated the patient, reviewed the resident's note and I agree with the findings and plan and agree with their ECG interpretation. Patient with healthcare associated pneumonia and hypoxia. Patient be admitted to medicine.  Juliet Rude. Rubin Payor, MD 08/26/11 1610

## 2011-08-26 NOTE — Progress Notes (Signed)
Called Portable Equipment department and ordered an air mattress overlay.  "Will be delivered tomorrow."

## 2011-08-26 NOTE — Progress Notes (Signed)
[facility] Progress Note [place] TEAM 1 - Stepdown/ICU TEAM   [name] VOZ:[id number] DOB: May 18, 1965 DOA: 08/24/2011 PCP: Terald Sleeper, MD  Assessment/Plan:  [facility]-associated pneumonia Continue empiric antibiotic therapy - blood cultures pending - followup chest x-ray in a.m.  Gram-negative rod pyelonephritis The patient has a chronic indwelling Foley catheter due to neurogenic bladder - given the high colony count however I suspect that this does in fact represent a true infection - we will continue her current antibiotics and followup on the speciation and sensitivities of her urine culture  Hypotension Likely due to hypovolemia/dehydration with the possibility of early bacteremia - blood cultures pending -  continue volume resuscitation  Acute respiratory failure with hypoxia Much improved at the present time - continue supplemental oxygen via trach - patient is having some pleuritic pain likely related to her infiltrate and also is hypoventilating related to her severe constipation  Tracheostomy dependent No evidence of trach complications at the present time - able to communicate effectively via Passy-Muir valve  Neurogenic bladder with chronic Foley catheter Urine culture as discussed above  Normocytic anemia No evidence of acute blood loss - suspect she has a component of chronic anemia - will follow   Quadriplegia  Schizophrenia Appears well compensated at present  Code Status: Full Family Communication: Direct communication with patient Disposition Plan: return to SNF  Brief narrative: 46 year old female with quadriplegia from the [facility] SN[facility] who was sent to the ED due to complaints of worsening SOB. She reported having increased tracheostomy secretions which had been yellow. She reported having fevers and chills. She was found to have decreased oxygen saturations at the SNF - down to 80% - and her oxygen was increased to 6  liters. The SNF performed a Chest X-ray and found a RLL Pneumonia.   Consultants: None  Procedures: None  Antibiotics: Zosyn 7/12 Vancomycin 7/12 Levaquin 7/13  HPI/Subjective: The patient states that she feels better overall but she has begun to develop some right-sided lateral pleuritic chest pain.  She also complains of severe constipation which she says is making it harder for her to breathe.  She denies chest pain nausea vomiting or headache.   Objective: Blood pressure 110/61, pulse 74, temperature 97.7 F (36.5 C), temperature source Oral, resp. rate 11, height 5\' 2"  (1.575 m), weight 109.8 kg (242 lb 1 oz), SpO2 100.00%.  Intake/Output Summary (Last 24 hours) at 08/26/11 1413 Last data filed at 08/26/11 1405  Gross per 24 hour  Intake   3285 ml  Output   2500 ml  Net    785 ml     Exam: General: No acute respiratory distress at rest Lungs: Bibasilar crackles appreciable but greatest on the right with no wheeze and good air movement throughout all fields - trach intact with no evidence of complications with a Passy-Muir valve in place Cardiovascular: Regular rate and rhythm without gallop or rub and no appreciable murmur Abdomen: Overweight, nontender, nondistended, soft, bowel sounds positive, no rebound, no ascites, no appreciable mass Extremities: No significant cyanosis, clubbing, or edema bilateral lower extremities  Data Reviewed: Basic Metabolic Panel:  Lab 08/26/11 4259 08/25/11 0426 08/24/11 1620  NA 140 138 139  K 4.1 4.1 4.2  CL 103 99 95*  CO2 30 31 33*  GLUCOSE 103* 122* 82  BUN 35* 42* 46*  CREATININE 0.98 1.02 1.02  CALCIUM 8.4 8.8 9.7  MG -- -- --  PHOS -- -- --   Liver Function Tests:  Lab 08/24/11 1620  AST 15  ALT 10  ALKPHOS 81  BILITOT 0.2*  PROT 8.5*  ALBUMIN 3.4*   CBC:  Lab 08/26/11 0444 08/25/11 0426 08/24/11 1620  WBC 7.7 7.5 7.0  NEUTROABS -- -- 3.5  HGB 8.8* 9.3* 10.9*  HCT 30.0* 31.3* 35.9*  MCV 78.7 78.4 78.0    PLT 143* 147* 165    Recent Results (from the past 240 hour(s))  CULTURE, BLOOD (ROUTINE X 2)     Status: Normal (Preliminary result)   Collection Time   08/24/11  6:10 PM      Component Value Range Status Comment   Specimen Description BLOOD SHOULDER RIGHT   Final    Special Requests BOTTLES DRAWN AEROBIC AND ANAEROBIC 3CC EA   Final    Culture  Setup Time 08/24/2011 22:07   Final    Culture     Final    Value:        BLOOD CULTURE RECEIVED NO GROWTH TO DATE CULTURE WILL BE HELD FOR 5 DAYS BEFORE ISSUING A FINAL NEGATIVE REPORT   Report Status PENDING   Incomplete   CULTURE, BLOOD (ROUTINE X 2)     Status: Normal (Preliminary result)   Collection Time   08/24/11  6:20 PM      Component Value Range Status Comment   Specimen Description BLOOD SHOULDER RIGHT   Final    Special Requests BOTTLES DRAWN AEROBIC AND ANAEROBIC 5CC EA   Final    Culture  Setup Time 08/24/2011 22:06   Final    Culture     Final    Value:        BLOOD CULTURE RECEIVED NO GROWTH TO DATE CULTURE WILL BE HELD FOR 5 DAYS BEFORE ISSUING A FINAL NEGATIVE REPORT   Report Status PENDING   Incomplete   URINE CULTURE     Status: Normal (Preliminary result)   Collection Time   08/24/11 11:21 PM      Component Value Range Status Comment   Specimen Description URINE, CATHETERIZED   Final    Special Requests NONE   Final    Culture  Setup Time 08/25/2011 04:39   Final    Colony Count >=100,000 COLONIES/ML   Final    Culture GRAM NEGATIVE RODS   Final    Report Status PENDING   Incomplete   MRSA PCR SCREENING     Status: Normal   Collection Time   08/25/11  1:46 AM      Component Value Range Status Comment   MRSA by PCR NEGATIVE  NEGATIVE Final      Studies:  Recent x-ray studies have been reviewed in detail by the Attending Physician  Scheduled Meds:  Reviewed in detail by the Attending Physician   Lonia Blood, MD [facility] Office  [phone] Pager [phone]  On-Call/Text Page:       [email or web address]      password [email or web address]  If 7PM-7AM, please contact night-coverage [email or web address] Password [email or web address] 08/26/2011, 2:13 PM   LOS: 2 days

## 2011-08-27 ENCOUNTER — Inpatient Hospital Stay (HOSPITAL_COMMUNITY): Payer: Medicare Other

## 2011-08-27 DIAGNOSIS — R0602 Shortness of breath: Secondary | ICD-10-CM

## 2011-08-27 LAB — BASIC METABOLIC PANEL
CO2: 29 mEq/L (ref 19–32)
Calcium: 8.7 mg/dL (ref 8.4–10.5)
Creatinine, Ser: 0.87 mg/dL (ref 0.50–1.10)
GFR calc non Af Amer: 79 mL/min — ABNORMAL LOW (ref >90)
Sodium: 144 mEq/L (ref 135–145)

## 2011-08-27 LAB — CBC
MCH: 23.6 pg — ABNORMAL LOW (ref 26.0–34.0)
MCV: 78.6 fL (ref 78.0–100.0)
Platelets: 148 10*3/uL — ABNORMAL LOW (ref 150–400)
RBC: 4.02 MIL/uL (ref 3.87–5.11)
RDW: 13.1 % (ref 11.5–15.5)
WBC: 6.9 10*3/uL (ref 4.0–10.5)

## 2011-08-27 MED ORDER — FUROSEMIDE 10 MG/ML IJ SOLN
40.0000 mg | Freq: Once | INTRAMUSCULAR | Status: AC
  Administered 2011-08-27: 40 mg via INTRAVENOUS
  Filled 2011-08-27: qty 4

## 2011-08-27 MED ORDER — FUROSEMIDE 20 MG PO TABS
20.0000 mg | ORAL_TABLET | Freq: Every day | ORAL | Status: DC
  Administered 2011-08-28 – 2011-08-29 (×2): 20 mg via ORAL
  Filled 2011-08-27 (×3): qty 1

## 2011-08-27 MED ORDER — LEVALBUTEROL HCL 0.63 MG/3ML IN NEBU
0.6300 mg | INHALATION_SOLUTION | RESPIRATORY_TRACT | Status: DC | PRN
  Filled 2011-08-27: qty 3

## 2011-08-27 MED ORDER — KETOROLAC TROMETHAMINE 15 MG/ML IJ SOLN
15.0000 mg | Freq: Once | INTRAMUSCULAR | Status: AC
  Administered 2011-08-27: 15 mg via INTRAVENOUS
  Filled 2011-08-27: qty 1

## 2011-08-27 MED ORDER — POTASSIUM CHLORIDE CRYS ER 20 MEQ PO TBCR
40.0000 meq | EXTENDED_RELEASE_TABLET | Freq: Once | ORAL | Status: AC
  Administered 2011-08-27: 40 meq via ORAL
  Filled 2011-08-27: qty 2

## 2011-08-27 MED ORDER — LEVALBUTEROL HCL 0.63 MG/3ML IN NEBU
0.6300 mg | INHALATION_SOLUTION | Freq: Four times a day (QID) | RESPIRATORY_TRACT | Status: DC
  Administered 2011-08-27 – 2011-08-31 (×16): 0.63 mg via RESPIRATORY_TRACT
  Filled 2011-08-27 (×20): qty 3

## 2011-08-27 MED ORDER — HYDRALAZINE HCL 25 MG PO TABS
25.0000 mg | ORAL_TABLET | Freq: Once | ORAL | Status: AC
  Administered 2011-08-27: 25 mg via ORAL
  Filled 2011-08-27: qty 1

## 2011-08-27 MED ORDER — IBUPROFEN 100 MG/5ML PO SUSP
200.0000 mg | Freq: Three times a day (TID) | ORAL | Status: DC | PRN
  Administered 2011-08-28: 200 mg via ORAL
  Filled 2011-08-27: qty 10

## 2011-08-27 MED ORDER — METHOCARBAMOL 500 MG PO TABS
500.0000 mg | ORAL_TABLET | Freq: Four times a day (QID) | ORAL | Status: DC | PRN
  Filled 2011-08-27: qty 1

## 2011-08-27 MED ORDER — FUROSEMIDE 20 MG PO TABS: 20.0000 mg | ORAL_TABLET | Freq: Every day | ORAL | Status: DC

## 2011-08-27 NOTE — Progress Notes (Signed)
VASCULAR LAB PRELIMINARY  PRELIMINARY  PRELIMINARY  PRELIMINARY  Bilateral lower extremity venous Dopplers completed.    Preliminary report:  There is no obvious DVT or SVT noted in the bilateral lower extremities.  Addy Mcmannis, 08/27/2011, 6:36 PM

## 2011-08-27 NOTE — Plan of Care (Signed)
Problem: Phase III Progression Outcomes Goal: O2 sats > or equal to 93% on room air Outcome: Completed/Met Date Met:  08/27/11 Pt sats range 96-97 on room air

## 2011-08-27 NOTE — Progress Notes (Signed)
ANTIBIOTIC CONSULT NOTE - INITIAL  Pharmacy Consult for Vancomycin/Zosyn/Levofloxacin Indication: rule out pneumonia, pyelonephritis    Labs:  Basename 08/27/11 0500 08/26/11 0444 08/25/11 0426  WBC 6.9 7.7 7.5  HGB 9.5* 8.8* 9.3*  PLT 148* 143* 147*  LABCREA -- -- --  CREATININE 0.87 0.98 1.02     Microbiology: Recent Results (from the past 720 hour(s))  CULTURE, BLOOD (ROUTINE X 2)     Status: Normal (Preliminary result)   Collection Time   08/24/11  6:10 PM      Component Value Range Status Comment   Specimen Description BLOOD SHOULDER RIGHT   Final    Special Requests BOTTLES DRAWN AEROBIC AND ANAEROBIC 3CC EA   Final    Culture  Setup Time 08/24/2011 22:07   Final    Culture     Final    Value:        BLOOD CULTURE RECEIVED NO GROWTH TO DATE CULTURE WILL BE HELD FOR 5 DAYS BEFORE ISSUING A FINAL NEGATIVE REPORT   Report Status PENDING   Incomplete   CULTURE, BLOOD (ROUTINE X 2)     Status: Normal (Preliminary result)   Collection Time   08/24/11  6:20 PM      Component Value Range Status Comment   Specimen Description BLOOD SHOULDER RIGHT   Final    Special Requests BOTTLES DRAWN AEROBIC AND ANAEROBIC 5CC EA   Final    Culture  Setup Time 08/24/2011 22:06   Final    Culture     Final    Value:        BLOOD CULTURE RECEIVED NO GROWTH TO DATE CULTURE WILL BE HELD FOR 5 DAYS BEFORE ISSUING A FINAL NEGATIVE REPORT   Report Status PENDING   Incomplete   URINE CULTURE     Status: Normal (Preliminary result)   Collection Time   08/24/11 11:21 PM      Component Value Range Status Comment   Specimen Description URINE, CATHETERIZED   Final    Special Requests NONE   Final    Culture  Setup Time 08/25/2011 04:39   Final    Colony Count >=100,000 COLONIES/ML   Final    Culture GRAM NEGATIVE RODS   Final    Report Status PENDING   Incomplete   MRSA PCR SCREENING     Status: Normal   Collection Time   08/25/11  1:46 AM      Component Value Range Status Comment   MRSA by PCR  NEGATIVE  NEGATIVE Final    Antibiotics: Vancomycin 7/12 --> Zosyn 7/12--> Levofloxacin 7/13--> Assessment: 46 y/o female patient admitted with shortness of breath requiring broad spectrum antibiotics for r/o pneumonia and pyelonephritis. Urine culture growing GNR.  Blood cultures no growth so far.  Goal of Therapy:  Vancomycin trough level 15-20 mcg/ml  Plan:  Continue antibiotics at the same dose. MD - consider stopping vancomycin today based on urine culture.  Celedonio Miyamoto, PharmD, BCPS Clinical Pharmacist Pager [phone]  08/27/2011,8:27 AM

## 2011-08-27 NOTE — Progress Notes (Signed)
Pt returned from CT scan at 1330hrs, BP on arrival back 143/107, HR 110s.  Started to assist to eat lunch, HR increased to 140s and stayed there for several minutes, pt c/o feeling SOB, sats in the *90s on 2L  [place].  BP 187/100.  HR returned to 100s at this time.  Weston Settle NP notified order for lasix and decrease IVF entered,  Transfer cancelled at this time.

## 2011-08-27 NOTE — Progress Notes (Signed)
[facility] Progress Note [place] TEAM 1 - Stepdown/ICU TEAM   [name] WJX:[id number] DOB: 02/10/1966 DOA: 08/24/2011 PCP: Terald Sleeper, MD  Assessment/Plan:  [facility]-associated pneumonia Continue empiric antibiotic therapy - blood cultures pending - followup chest x-ray 08/27/2011 demonstrate bibasilar opacities likely not significantly changed.  Gram-negative rod pyelonephritis The patient has a chronic indwelling Foley catheter due to neurogenic bladder - given the high colony count however I suspect that this does in fact represent a true infection - we will continue her current antibiotics and followup on the speciation and sensitivities of her urine culture -change her foley cath today (is due for her Q monthly change any way)  Hypotension Resolved Likely due to hypovolemia/dehydration with the possibility of early bacteremia - blood cultures pending  Acute respiratory failure with hypoxia Much improved at the present time - continue supplemental oxygen via trach - patient is having some pleuritic pain likely related to her infiltrate and also is hypoventilating related to her severe constipation - Given her tachycardia and the fact she is chronically bedbound it would be prudent to rule out PE - unable to give IV contrast via power port and IV team unable to procure peripheral access so have canceled CT of the chest - will obtain lower extremity Dopplers to rule out DVT **Since evaluated early this morning she developed a burst of sinus tachycardia with rates up into the 140s. This was associated with hypertension. Despite having normal saturations she has also been complaining to her nurse and having trouble breathing. Prior to admission she was on Lasix 20 mg daily. Suspect the patient may be developing early signs of volume overload so we'll decrease IV fluids to keep open rate and resume home Lasix. In addition we'll give a one-time dose of Lasix 40 mg  IV now **since receiving the Lasix she has diuresed out greater than 2 L of urine and blood pressure is up to 193/90 so we'll repeat Lasix at 8 PM tonight and go ahead and get a one-time dose of hydralazine for afterload reduction. I've also given one-time dose potassium 40 mEq to prevent Lasix-induced hypokalemia.  Tracheostomy dependent No evidence of trach complications at the present time - able to communicate effectively via Passy-Muir valve  Neurogenic bladder with chronic Foley catheter Urine culture as discussed above-we'll change Foley catheter today 08/27/2011  Normocytic anemia No evidence of acute blood loss - suspect she has a component of chronic anemia - will follow   Quadriplegia  Schizophrenia Appears well compensated at present  Code Status: Full Family Communication: Direct communication with patient Disposition Plan: return to SNF when medically stable for now remain in step down  Brief narrative: 46 year old female with quadriplegia from the [facility] SNF who was sent to the ED due to complaints of worsening SOB. She reported having increased tracheostomy secretions which had been yellow. She reported having fevers and chills. She was found to have decreased oxygen saturations at the SNF - down to 80% - and her oxygen was increased to 6 liters. The SNF performed a Chest X-ray and found a RLL Pneumonia.   Consultants: None  Procedures: None  Antibiotics: Zosyn 7/12 Vancomycin 7/12 Levaquin 7/13  HPI/Subjective: The patient states that she feels better overall but she continues to report some right-sided lateral pleuritic chest pain.  She also complains of severe constipation which she says is making it harder for her to breathe.  She denies chest pain nausea vomiting or headache.  Objective: Blood pressure 179/89, pulse 101, temperature 97.6 F (36.4 C), temperature source Oral, resp. rate 18, height 5\' 2"  (1.575 m), weight 108.2 kg (238 lb 8.6 oz),  SpO2 97.00%.  Intake/Output Summary (Last 24 hours) at 08/27/11 1022 Last data filed at 08/27/11 0900  Gross per 24 hour  Intake 4297.08 ml  Output   4976 ml  Net -678.92 ml     Exam: General: No acute respiratory distress at rest Lungs: Bibasilar crackles appreciable but greatest on the right with no wheeze and good air movement throughout all fields - trach intact with no evidence of complications with a Passy-Muir valve in place Cardiovascular: Regular rate and rhythm without gallop or rub and no appreciable murmur Abdomen: Overweight, nontender, nondistended, soft, bowel sounds positive, no rebound, no ascites, no appreciable mass Extremities: No significant cyanosis, clubbing, edema bilateral lower extremities  Data Reviewed: Basic Metabolic Panel:  Lab 08/27/11 4782 08/26/11 0444 08/25/11 0426 08/24/11 1620  NA 144 140 138 139  K 4.3 4.1 4.1 4.2  CL 105 103 99 95*  CO2 29 30 31  33*  GLUCOSE 94 103* 122* 82  BUN 26* 35* 42* 46*  CREATININE 0.87 0.98 1.02 1.02  CALCIUM 8.7 8.4 8.8 9.7  MG -- -- -- --  PHOS -- -- -- --   Liver Function Tests:  Lab 08/24/11 1620  AST 15  ALT 10  ALKPHOS 81  BILITOT 0.2*  PROT 8.5*  ALBUMIN 3.4*   CBC:  Lab 08/27/11 0500 08/26/11 0444 08/25/11 0426 08/24/11 1620  WBC 6.9 7.7 7.5 7.0  NEUTROABS -- -- -- 3.5  HGB 9.5* 8.8* 9.3* 10.9*  HCT 31.6* 30.0* 31.3* 35.9*  MCV 78.6 78.7 78.4 78.0  PLT 148* 143* 147* 165    Recent Results (from the past 240 hour(s))  CULTURE, BLOOD (ROUTINE X 2)     Status: Normal (Preliminary result)   Collection Time   08/24/11  6:10 PM      Component Value Range Status Comment   Specimen Description BLOOD SHOULDER RIGHT   Final    Special Requests BOTTLES DRAWN AEROBIC AND ANAEROBIC 3CC EA   Final    Culture  Setup Time 08/24/2011 22:07   Final    Culture     Final    Value:        BLOOD CULTURE RECEIVED NO GROWTH TO DATE CULTURE WILL BE HELD FOR 5 DAYS BEFORE ISSUING A FINAL NEGATIVE REPORT    Report Status PENDING   Incomplete   CULTURE, BLOOD (ROUTINE X 2)     Status: Normal (Preliminary result)   Collection Time   08/24/11  6:20 PM      Component Value Range Status Comment   Specimen Description BLOOD SHOULDER RIGHT   Final    Special Requests BOTTLES DRAWN AEROBIC AND ANAEROBIC 5CC EA   Final    Culture  Setup Time 08/24/2011 22:06   Final    Culture     Final    Value:        BLOOD CULTURE RECEIVED NO GROWTH TO DATE CULTURE WILL BE HELD FOR 5 DAYS BEFORE ISSUING A FINAL NEGATIVE REPORT   Report Status PENDING   Incomplete   URINE CULTURE     Status: Normal (Preliminary result)   Collection Time   08/24/11 11:21 PM      Component Value Range Status Comment   Specimen Description URINE, CATHETERIZED   Final    Special Requests NONE   Final  Culture  Setup Time 08/25/2011 04:39   Final    Colony Count >=100,000 COLONIES/ML   Final    Culture GRAM NEGATIVE RODS   Final    Report Status PENDING   Incomplete   MRSA PCR SCREENING     Status: Normal   Collection Time   08/25/11  1:46 AM      Component Value Range Status Comment   MRSA by PCR NEGATIVE  NEGATIVE Final      Studies:  Recent x-ray studies have been reviewed in detail by the Attending Physician  Scheduled Meds:  Reviewed in detail by the Attending Physician   Junious Silk, ANP [facility] Office  [phone] Pager [phone]  On-Call/Text Page:      [email or web address]      password [email or web address]  If 7PM-7AM, please contact night-coverage [email or web address] Password [email or web address] 08/27/2011, 10:22 AM   LOS: 3 days   I have personally examined this patient and reviewed the entire database. I have reviewed the above note, made any necessary editorial changes, and agree with its content.  Lonia Blood, MD [facility]

## 2011-08-27 NOTE — Progress Notes (Addendum)
INITIAL ADULT NUTRITION ASSESSMENT Date: 08/27/2011   Time: 12:10 PM  Reason for Assessment: Nutrition Risk Report  ASSESSMENT: Female 46 y.o.  Dx: Healthcare-associated pneumonia  Hx:  Past Medical History  Diagnosis Date  . Respiratory failure   . Dysphagia   . Contracture of hand joint   . Anemia   . Neurogenic bladder   . Anxiety   . Schizophrenia   . Quadriplegia    Past Surgical History  Procedure Date  . Tracheostomy   . Gastrostomy w/ feeding tube    Related Meds:     . albuterol  2.5 mg Nebulization Q6H  . antiseptic oral rinse  15 mL Mouth Rinse BID  . calcium-vitamin D  1 tablet Oral q morning - 10a  . clonazePAM  0.5 mg Oral BID  . docusate sodium  100 mg Oral BID  . enoxaparin (LOVENOX) injection  40 mg Subcutaneous Q24H  . feeding supplement  30 mL Oral BID  . ferrous sulfate  325 mg Oral Q breakfast  . hydrALAZINE  5 mg Intravenous Once  . ipratropium  0.5 mg Nebulization Q6H  . ketorolac  15 mg Intravenous Once  . lactulose  20 g Oral BID  . levofloxacin  750 mg Oral Daily  . multivitamin with minerals  5 mL Oral Daily  . OLANZapine  15 mg Oral QHS  . omega-3 acid ethyl esters  2 g Oral BID  . oxybutynin  5 mg Oral BID  . pantoprazole  40 mg Oral Q1200  . piperacillin-tazobactam (ZOSYN)  IV  3.375 g Intravenous Q8H  . polycarbophil  625 mg Oral QID  . polyethylene glycol  17 g Oral BID  . polyvinyl alcohol  1 drop Both Eyes QID  . traZODone  100 mg Oral QHS  . vancomycin  1,000 mg Intravenous Q12H  . venlafaxine  100 mg Oral BID  . vitamin C  500 mg Oral q morning - 10a  . DISCONTD: polyethylene glycol  17 g Oral q morning - 10a   Ht: 5\' 2"  (157.5 cm)  Wt: 238 lb 8.6 oz (108.2 kg)  Ideal Wt (adjusted for quadriplegia): 49.5 kg % Ideal Wt: 219%  Wt Readings from Last 15 Encounters:  08/27/11 238 lb 8.6 oz (108.2 kg)  07/25/11 234 lb 9.1 oz (106.4 kg)  Usual Wt: 234 lb % Usual Wt: 102%  Body mass index is 43.63 kg/(m^2). Obesity  Class III (Extreme)  Food/Nutrition Related Hx: from nursing home, trach and PEG  Labs:  CMP     Component Value Date/Time   NA 144 08/27/2011 0500   K 4.3 08/27/2011 0500   CL 105 08/27/2011 0500   CO2 29 08/27/2011 0500   GLUCOSE 94 08/27/2011 0500   BUN 26* 08/27/2011 0500   CREATININE 0.87 08/27/2011 0500   CALCIUM 8.7 08/27/2011 0500   PROT 8.5* 08/24/2011 1620   ALBUMIN 3.4* 08/24/2011 1620   AST 15 08/24/2011 1620   ALT 10 08/24/2011 1620   ALKPHOS 81 08/24/2011 1620   BILITOT 0.2* 08/24/2011 1620   GFRNONAA 79* 08/27/2011 0500   GFRAA >90 08/27/2011 0500    Intake/Output Summary (Last 24 hours) at 08/27/11 1211 Last data filed at 08/27/11 1200  Gross per 24 hour  Intake 4457.08 ml  Output   5701 ml  Net -1243.92 ml   Diet Order: General  Supplements/Tube Feeding: 30 ml Prostat PO BID  IVF:    sodium chloride Last Rate: 100 mL (08/27/11 1044)  Estimated Nutritional Needs:   Kcal: 1675 - 1845 Protein:  80 - 90 grams protein Fluid:  1.8 - 2 liters daily  Pt with quadriplegia. Trach and PEG. Admitted for SOB, work-up has revealed HCAP. From [facility] SNF.  Pt eating mostly 100% of meals. Pt denies any trouble with appetite PTA. States that she does not use PEG for nutrition at nursing home. Per nursing home records pt receives 200 ml water flushes via PEG TID. Also takes 30 ml Prostat PO BID.  NUTRITION DIAGNOSIS: -Increased nutrient needs (NI-5.1).  Status: Ongoing  RELATED TO: recent skin breakdown and ongoing wound healing  AS EVIDENCE BY: estimated needs.  MONITORING/EVALUATION(Goals): Goal: Pt to meet >/= 90% of their estimated nutrition needs Monitor: weights, labs, PO intake, I/O's  EDUCATION NEEDS: -No education needs identified at this time  INTERVENTION: 1. Recommend resuming free water flushes of 200 ml water TID once IVF discontinued 2. Continue 30 ml Prostat PO BID to help maintain skin integrity 3. RD to continue to follow nutrition care  plan  Jarold Motto MS, RD, LDN Pager: [phone] After-hours pager: [phone]   DOCUMENTATION CODES Per approved criteria  -Morbid Obesity

## 2011-08-27 NOTE — Progress Notes (Signed)
Pt BP remained elevated at 1600hrs, A. Rennis Harding notified.  Also notified of diuresis from lasix dose given, over 2L of clear yellow urine.  Orders entered.  Will give hydralazine per order and continue to monitor BP.

## 2011-08-27 NOTE — Plan of Care (Signed)
Problem: Phase II Progression Outcomes Goal: Wean O2 if indicated Outcome: Completed/Met Date Met:  08/27/11 Pt oxygen status is above target on room air, however pt request [place] oxygen as a comfort

## 2011-08-27 NOTE — Progress Notes (Signed)
Utilization Review Completed.  [name]  08/27/2011  

## 2011-08-28 ENCOUNTER — Inpatient Hospital Stay (HOSPITAL_COMMUNITY): Payer: Medicare Other

## 2011-08-28 LAB — BASIC METABOLIC PANEL
BUN: 27 mg/dL — ABNORMAL HIGH (ref 6–23)
CO2: 30 mEq/L (ref 19–32)
Calcium: 9 mg/dL (ref 8.4–10.5)
GFR calc non Af Amer: 60 mL/min — ABNORMAL LOW (ref >90)
Glucose, Bld: 99 mg/dL (ref 70–99)
Sodium: 143 mEq/L (ref 135–145)

## 2011-08-28 LAB — CBC
HCT: 32.7 % — ABNORMAL LOW (ref 36.0–46.0)
Hemoglobin: 9.8 g/dL — ABNORMAL LOW (ref 12.0–15.0)
MCH: 23.3 pg — ABNORMAL LOW (ref 26.0–34.0)
MCHC: 30 g/dL (ref 30.0–36.0)
RBC: 4.21 MIL/uL (ref 3.87–5.11)

## 2011-08-28 MED ORDER — AMLODIPINE BESYLATE 5 MG PO TABS
5.0000 mg | ORAL_TABLET | Freq: Every day | ORAL | Status: DC
  Administered 2011-08-28 – 2011-08-29 (×2): 5 mg via ORAL
  Filled 2011-08-28 (×2): qty 1

## 2011-08-28 MED ORDER — ENOXAPARIN SODIUM 100 MG/ML ~~LOC~~ SOLN
1.0000 mg/kg | Freq: Once | SUBCUTANEOUS | Status: AC
  Administered 2011-08-28: 100 mg via SUBCUTANEOUS
  Filled 2011-08-28: qty 1

## 2011-08-28 MED ORDER — KETOROLAC TROMETHAMINE 15 MG/ML IJ SOLN
15.0000 mg | Freq: Three times a day (TID) | INTRAMUSCULAR | Status: AC
  Administered 2011-08-28 – 2011-08-29 (×6): 15 mg via INTRAVENOUS
  Filled 2011-08-28 (×7): qty 1

## 2011-08-28 MED ORDER — IBUPROFEN 100 MG/5ML PO SUSP
600.0000 mg | Freq: Three times a day (TID) | ORAL | Status: DC | PRN
  Filled 2011-08-28 (×2): qty 30

## 2011-08-28 MED ORDER — ENOXAPARIN SODIUM 40 MG/0.4ML ~~LOC~~ SOLN
40.0000 mg | SUBCUTANEOUS | Status: DC
  Administered 2011-08-29 – 2011-08-30 (×2): 40 mg via SUBCUTANEOUS
  Filled 2011-08-28 (×3): qty 0.4

## 2011-08-28 MED ORDER — SODIUM CHLORIDE 0.9 % IJ SOLN
10.0000 mL | INTRAMUSCULAR | Status: DC | PRN
  Administered 2011-08-29 – 2011-08-31 (×2): 10 mL

## 2011-08-28 NOTE — Progress Notes (Addendum)
Pt transferred to [id number].  Report called to [facility].  Pt vital signs were stable prior to and during transport to new unit.  Pt was transferred on RA,  NS IV fluids, along with belongings.  There were not any questions nor concerns that were unanswered before transport.  Message left for pt sister Adela Lank to inform her of the room change.

## 2011-08-28 NOTE — Progress Notes (Addendum)
[facility] Progress Note  TEAM 1 - Stepdown/ICU TEAM   Via Rosado GEX:[id number] DOB: Nov 18, 1965 DOA: 08/24/2011 PCP: Terald Sleeper, MD  Assessment/Plan:  [facility]-associated pneumonia Continue empiric antibiotic therapy - blood cultures pending - followup chest x-ray 08/27/2011 demonstrate bibasilar opacities likely not significantly changed.  Gram-negative rod pyelonephritis The patient has a chronic indwelling Foley catheter due to neurogenic bladder - given the high colony count however I suspect that this does in fact represent a true infection - we will continue her current antibiotics and followup on the speciation and sensitivities of her urine culture --discontinue vancomycin  Hypotension Resolved Likely due to hypovolemia/dehydration with the possibility of early bacteremia - blood cultures pending  Acute respiratory failure with hypoxia *Much improved at the present time - continue supplemental oxygen via trach - patient is having some pleuritic pain likely related to her infiltrate and also is hypoventilating related to her severe constipation - Given her tachycardia and the fact she is chronically bedbound it would be prudent to rule out PE - unable to give IV contrast via power port and IV team unable to procure peripheral access so have canceled CT of the chest would have the same issues if attempted to obtain VQ scan  *Lower extremity Dopplers to rule out DVT-results are pending *Yesterday developed volume overload with hypertension and received 2 doses of Lasix 40 mg IV as well as a one-time dose of hydralazine 25 mg by mouth with subsequent diuresis of nearly 6000 cc of fluid on 08/27/2011 *She also had an episode of this during last hospitalization. No apparent echocardiogram has ever been done on this patient according to the electronic medical record so we will obtain an echocardiogram this admission  Right-sided pleuritic chest  pain *Suspect is related to underlying pneumonia process *Not having adequate pain control combination narcotics and very low dose of ibuprofen *Will DC low-dose ibuprofen in favor of scheduled low-dose Toradol for the next 48 hours then transition to a higher dose ibuprofen after that.  Tachycardia/hypertension *Is a new problem and as above differential includes possible PE *More likely explanation is pain related-please see above regarding patient's pleuritic chest discomfort *Will not begin medications to treat hypertension and tell pleuritic chest pain is well-controlled  Tracheostomy dependent *No evidence of trach complications at the present time - able to communicate effectively via Passy-Muir valve  Neurogenic bladder with chronic Foley catheter *Urine culture as discussed above *Foley catheter changed  08/27/2011  Normocytic anemia *No evidence of acute blood loss  *suspect she has a component of chronic anemia - will follow   Quadriplegia  Schizophrenia *Appears well compensated at present  Code Status: Full Family Communication: Direct communication with patient Disposition Plan: return to SNF when medically stable for now remain in step down  Brief narrative: 46 year old female with quadriplegia from the [facility] SNF who was sent to the ED due to complaints of worsening SOB. She reported having increased tracheostomy secretions which had been yellow. She reported having fevers and chills. She was found to have decreased oxygen saturations at the SNF - down to 80% - and her oxygen was increased to 6 liters. The SNF performed a Chest X-ray and found a RLL Pneumonia.   Consultants: None  Procedures: None  Antibiotics: Zosyn 7/12 Vancomycin 7/12 Levaquin 7/13  HPI/Subjective: The patient states that she feels better overall but she continues to report some right-sided lateral pleuritic chest pain.  She also complains of severe constipation which she  says is  making it harder for her to breathe.  She denies chest pain nausea vomiting or headache.   Objective: Blood pressure 141/79, pulse 93, temperature 97.7 F (36.5 C), temperature source Axillary, resp. rate 13, height 5\' 2"  (1.575 m), weight 100 kg (220 lb 7.4 oz), SpO2 97.00%.  Intake/Output Summary (Last 24 hours) at 08/28/11 1130 Last data filed at 08/28/11 1000  Gross per 24 hour  Intake   1776 ml  Output   5825 ml  Net  -4049 ml     Exam: General: No acute respiratory distress at rest Lungs: Bibasilar crackles appreciable but greatest on the right with no wheeze and good air movement throughout all fields - trach intact with no evidence of complications with a Passy-Muir valve in place Cardiovascular: Regular rate and rhythm without gallop or rub and no appreciable murmur Abdomen: Overweight, nontender, nondistended, soft, bowel sounds positive, no rebound, no ascites, no appreciable mass Extremities: No significant cyanosis, clubbing, edema bilateral lower extremities  Data Reviewed: Basic Metabolic Panel:  Lab 08/28/11 1308 08/27/11 0500 08/26/11 0444 08/25/11 0426 08/24/11 1620  NA 143 144 140 138 139  K 4.4 4.3 4.1 4.1 4.2  CL 101 105 103 99 95*  CO2 30 29 30 31  33*  GLUCOSE 99 94 103* 122* 82  BUN 27* 26* 35* 42* 46*  CREATININE 1.09 0.87 0.98 1.02 1.02  CALCIUM 9.0 8.7 8.4 8.8 9.7  MG -- -- -- -- --  PHOS -- -- -- -- --   Liver Function Tests:  Lab 08/24/11 1620  AST 15  ALT 10  ALKPHOS 81  BILITOT 0.2*  PROT 8.5*  ALBUMIN 3.4*   CBC:  Lab 08/28/11 0453 08/27/11 0500 08/26/11 0444 08/25/11 0426 08/24/11 1620  WBC 8.1 6.9 7.7 7.5 7.0  NEUTROABS -- -- -- -- 3.5  HGB 9.8* 9.5* 8.8* 9.3* 10.9*  HCT 32.7* 31.6* 30.0* 31.3* 35.9*  MCV 77.7* 78.6 78.7 78.4 78.0  PLT 173 148* 143* 147* 165    Recent Results (from the past 240 hour(s))  CULTURE, BLOOD (ROUTINE X 2)     Status: Normal (Preliminary result)   Collection Time   08/24/11  6:10 PM       Component Value Range Status Comment   Specimen Description BLOOD SHOULDER RIGHT   Final    Special Requests BOTTLES DRAWN AEROBIC AND ANAEROBIC 3CC EA   Final    Culture  Setup Time 08/24/2011 22:07   Final    Culture     Final    Value:        BLOOD CULTURE RECEIVED NO GROWTH TO DATE CULTURE WILL BE HELD FOR 5 DAYS BEFORE ISSUING A FINAL NEGATIVE REPORT   Report Status PENDING   Incomplete   CULTURE, BLOOD (ROUTINE X 2)     Status: Normal (Preliminary result)   Collection Time   08/24/11  6:20 PM      Component Value Range Status Comment   Specimen Description BLOOD SHOULDER RIGHT   Final    Special Requests BOTTLES DRAWN AEROBIC AND ANAEROBIC 5CC EA   Final    Culture  Setup Time 08/24/2011 22:06   Final    Culture     Final    Value:        BLOOD CULTURE RECEIVED NO GROWTH TO DATE CULTURE WILL BE HELD FOR 5 DAYS BEFORE ISSUING A FINAL NEGATIVE REPORT   Report Status PENDING   Incomplete   URINE CULTURE  Status: Normal (Preliminary result)   Collection Time   08/24/11 11:21 PM      Component Value Range Status Comment   Specimen Description URINE, CATHETERIZED   Final    Special Requests NONE   Final    Culture  Setup Time 08/25/2011 04:39   Final    Colony Count >=100,000 COLONIES/ML   Final    Culture GRAM NEGATIVE RODS   Final    Report Status PENDING   Incomplete   MRSA PCR SCREENING     Status: Normal   Collection Time   08/25/11  1:46 AM      Component Value Range Status Comment   MRSA by PCR NEGATIVE  NEGATIVE Final      Studies:  Recent x-ray studies have been reviewed in detail by the Attending Physician  Scheduled Meds:  Reviewed in detail by the Attending Physician   Junious Silk, ANP [facility] Office  [phone] Pager [phone]  On-Call/Text Page:      [email or web address]      password [email or web address]  If 7PM-7AM, please contact night-coverage [email or web address] Password [email or web address] 08/28/2011, 11:30 AM   LOS: 4 days   I have seen and examined Ms Sease at bed  side. Discussed plan of care with [facility] as above. Patient needs 7-10 days of abx. Since Ms Breon continues to complain of right sided, pleuritic chest pain, and does not have fever or leukocytosis, is mostly bed bound, I am concerned about PE. Getting adequate iv access has been an issue. Will give a therapeutic dose of lovenox, obtain d.dimer level, request ct chest with contrast. If can't get ct chest due to iv access, consider v/q scan- hopefully can use porta cath for nuclear study. I have discussed this with Ms Burlison. She has also mentioned a "knot" in the left breast, which will need eval with ultrasound after acute issues addressed- at cancer center.  Nhyla Nappi,MD pager#[phone].

## 2011-08-28 NOTE — Progress Notes (Signed)
  Echocardiogram 2D Echocardiogram has been performed.  [name] 08/28/2011, 10:16 AM

## 2011-08-28 NOTE — Clinical Social Work Psychosocial (Signed)
     Clinical Social Work Department BRIEF PSYCHOSOCIAL ASSESSMENT 08/28/2011  Patient:  [name], [name]     Account Number:  [id number]     Admit date:  08/24/2011  Clinical Social Worker:  Burnard Hawthorne  Date/Time:  08/28/2011 02:23 PM  Referred by:  Physician  Date Referred:  08/28/2011 Referred for  SNF Placement   Other Referral:   Interview type:   Other interview type:    PSYCHOSOCIAL DATA Living Status:  FACILITY Admitted from facility:  [facility] Level of care:  Skilled Nursing Facility Primary support name:  Gardiner Rhyme Primary support relationship to patient:  SIBLING Degree of support available:   Unknown    CURRENT CONCERNS Current Concerns  Other - See comment   Other Concerns:   Return to SNF. Patient is a quad and has a old trach. She is able to communicate verbally.    SOCIAL WORK ASSESSMENT / PLAN Resident of [facility] and [place]. Patient was transferred fom 2600 to 3700 this afternoon. Will notify receiving CSW.  Spoke with Admissions at [facility]- will accept patient back when medically stable. Patient requested assistance from SW to call her sister.  SW dialed phone and held phone so she could chat with her sister- she stated it made her very happy.  Patient wants to return to [facility] when she is medically stable.   Assessment/plan status:  Other - See comment Other assessment/ plan:   Return to SNF when medically stable.  Patient is worried about her cable bill. Contacted Child psychotherapist at [employer]- and notified her of patient's concern.  She stated that she would follow up with financial services to make sure that this issue is attended to   Information/referral to community resources:   Patient declined any community resource information    PATIENTS/FAMILYS RESPONSE TO PLAN OF CARE: Patient appears relaxed and content. She was very happy to be able to talk to her sister and stated that she wants  to return to [facility] when medically stable.

## 2011-08-29 ENCOUNTER — Encounter (HOSPITAL_COMMUNITY): Payer: Self-pay | Admitting: Radiology

## 2011-08-29 DIAGNOSIS — F411 Generalized anxiety disorder: Secondary | ICD-10-CM

## 2011-08-29 DIAGNOSIS — R0902 Hypoxemia: Secondary | ICD-10-CM

## 2011-08-29 DIAGNOSIS — R6510 Systemic inflammatory response syndrome (SIRS) of non-infectious origin without acute organ dysfunction: Secondary | ICD-10-CM

## 2011-08-29 DIAGNOSIS — F209 Schizophrenia, unspecified: Secondary | ICD-10-CM

## 2011-08-29 LAB — CBC
Hemoglobin: 9.1 g/dL — ABNORMAL LOW (ref 12.0–15.0)
MCH: 23.3 pg — ABNORMAL LOW (ref 26.0–34.0)
MCHC: 30 g/dL (ref 30.0–36.0)
MCV: 77.5 fL — ABNORMAL LOW (ref 78.0–100.0)
Platelets: 171 10*3/uL (ref 150–400)
RBC: 3.91 MIL/uL (ref 3.87–5.11)

## 2011-08-29 LAB — BASIC METABOLIC PANEL
CO2: 29 mEq/L (ref 19–32)
Calcium: 8.8 mg/dL (ref 8.4–10.5)
Creatinine, Ser: 1.32 mg/dL — ABNORMAL HIGH (ref 0.50–1.10)
GFR calc non Af Amer: 48 mL/min — ABNORMAL LOW (ref >90)
Glucose, Bld: 97 mg/dL (ref 70–99)

## 2011-08-29 MED ORDER — IOHEXOL 350 MG/ML SOLN
100.0000 mL | Freq: Once | INTRAVENOUS | Status: AC | PRN
  Administered 2011-08-29: 100 mL via INTRAVENOUS

## 2011-08-29 MED ORDER — AMLODIPINE BESYLATE 10 MG PO TABS
10.0000 mg | ORAL_TABLET | Freq: Every day | ORAL | Status: DC
  Administered 2011-08-29: 10 mg via ORAL
  Filled 2011-08-29 (×2): qty 1

## 2011-08-29 MED ORDER — AMLODIPINE BESYLATE 10 MG PO TABS: 10.0000 mg | ORAL_TABLET | Freq: Every day | ORAL | Status: DC

## 2011-08-29 NOTE — Progress Notes (Signed)
Noticed that PT had been running ST in 110s and SBP 130s-140s; pt now running SR 80s and SBP 180s; pt without any complaints, Dr.Rai paged and made awareof changes, told to start giving pts norvasc today; will continue to monitor

## 2011-08-29 NOTE — Progress Notes (Signed)
Continuous  pulse ox ordered. Pt placed on standard continuous pulse ox until the machine arrives. Will continue to monitor the pt. Sanda Linger

## 2011-08-29 NOTE — Progress Notes (Signed)
Patient ID: [name]  female  WUJ:[id number]    DOB: 31-Jul-1965    DOA: 08/24/2011  PCP: Terald Sleeper, MD  Subjective: Feels okay, no acute issues overnight. Patient denies any nausea vomiting or any acute shortness of breath. States chest pain is improving.  Objective: Weight change:   Intake/Output Summary (Last 24 hours) at 08/29/11 1240 Last data filed at 08/29/11 0838  Gross per 24 hour  Intake   1220 ml  Output   2950 ml  Net  -1730 ml   Blood pressure 159/87, pulse 81, temperature 97.6 F (36.4 C), temperature source Oral, resp. rate 20, height 5\' 2"  (1.575 m), weight 100 kg (220 lb 7.4 oz), SpO2 100.00%.  Physical Exam: General: Alert and awake, oriented x3, not in any acute distress. HEENT: anicteric sclera, pupils reactive to light and accommodation, EOMI CVS: S1-S2 clear, no murmur rubs or gallops Chest: Bibasilar crackles Abdomen: Obese, soft nontender, G-tube, normal bowel sounds, no organomegaly Extremities: no cyanosis, clubbing. + edema noted bilaterally Neuro: Cranial nerves II-XII intact, no focal neurological deficits  Lab Results: Basic Metabolic Panel:  Lab 08/29/11 9562 08/28/11 0453  NA 140 143  K 4.3 4.4  CL 99 101  CO2 29 30  GLUCOSE 97 99  BUN 32* 27*  CREATININE 1.32* 1.09  CALCIUM 8.8 9.0  MG -- --  PHOS -- --   Liver Function Tests:  Lab 08/24/11 1620  AST 15  ALT 10  ALKPHOS 81  BILITOT 0.2*  PROT 8.5*  ALBUMIN 3.4*   No results found for this basename: LIPASE:2,AMYLASE:2 in the last 168 hours No results found for this basename: AMMONIA:2 in the last 168 hours CBC:  Lab 08/29/11 0445 08/28/11 0453 08/24/11 1620  WBC 8.8 8.1 --  NEUTROABS -- -- 3.5  HGB 9.1* 9.8* --  HCT 30.3* 32.7* --  MCV 77.5* 77.7* --  PLT 171 173 --   Cardiac Enzymes: No results found for this basename: CKTOTAL:3,CKMB:3,CKMBINDEX:3,TROPONINI:3 in the last 168 hours BNP: No components found with this basename: POCBNP:2 CBG: No results  found for this basename: GLUCAP:5 in the last 168 hours   Micro Results: Recent Results (from the past 240 hour(s))  CULTURE, BLOOD (ROUTINE X 2)     Status: Normal (Preliminary result)   Collection Time   08/24/11  6:10 PM      Component Value Range Status Comment   Specimen Description BLOOD SHOULDER RIGHT   Final    Special Requests BOTTLES DRAWN AEROBIC AND ANAEROBIC 3CC EA   Final    Culture  Setup Time 08/24/2011 22:07   Final    Culture     Final    Value:        BLOOD CULTURE RECEIVED NO GROWTH TO DATE CULTURE WILL BE HELD FOR 5 DAYS BEFORE ISSUING A FINAL NEGATIVE REPORT   Report Status PENDING   Incomplete   CULTURE, BLOOD (ROUTINE X 2)     Status: Normal (Preliminary result)   Collection Time   08/24/11  6:20 PM      Component Value Range Status Comment   Specimen Description BLOOD SHOULDER RIGHT   Final    Special Requests BOTTLES DRAWN AEROBIC AND ANAEROBIC 5CC EA   Final    Culture  Setup Time 08/24/2011 22:06   Final    Culture     Final    Value:        BLOOD CULTURE RECEIVED NO GROWTH TO DATE CULTURE WILL BE HELD FOR 5  DAYS BEFORE ISSUING A FINAL NEGATIVE REPORT   Report Status PENDING   Incomplete   URINE CULTURE     Status: Normal (Preliminary result)   Collection Time   08/24/11 11:21 PM      Component Value Range Status Comment   Specimen Description URINE, CATHETERIZED   Final    Special Requests NONE   Final    Culture  Setup Time 08/25/2011 04:39   Final    Colony Count >=100,000 COLONIES/ML   Final    Culture GRAM NEGATIVE RODS   Final    Report Status PENDING   Incomplete   MRSA PCR SCREENING     Status: Normal   Collection Time   08/25/11  1:46 AM      Component Value Range Status Comment   MRSA by PCR NEGATIVE  NEGATIVE Final     Studies/Results: Dg Abd 1 View  08/24/2011  *RADIOLOGY REPORT*  Clinical Data: Abdominal pain, nausea and constipation.  ABDOMEN - 1 VIEW  Comparison: 11/01/2010.  Findings: Stool is seen throughout the colon.  Spinal  stimulator is in place.  IMPRESSION: Severe constipation.  Original Report Authenticated By: Reyes Ivan, M.D.   Ct Angio Chest W/cm &/or Wo Cm  08/29/2011  *RADIOLOGY REPORT*  Clinical Data: Chest pain.  CT ANGIOGRAPHY CHEST  Technique:  Multidetector CT imaging of the chest using the standard protocol during bolus administration of intravenous contrast. Multiplanar reconstructed images including MIPs were obtained and reviewed to evaluate the vascular anatomy.  Contrast: OMNIPAQUE IOHEXOL 350 MG/ML SOLN  Comparison: 07/25/2011.  Findings: The contrast bolus is somewhat marginal although there is reasonable visualization of the central and proximal segmental pulmonary arteries.  No gross filling defects are demonstrated in the visualized vessels suggesting no evidence of significant pulmonary embolus.  Peripheral emboli cannot be excluded.  Diffuse cardiac enlargement.  Normal caliber thoracic aorta.  No significant lymphadenopathy in the chest.  Tracheostomy.  Motion artifact limits visualization of the lungs.  There appears to be consolidation or atelectasis in both lung bases.  Changes are similar to previous study.  No pneumothorax.  Limited visualization of upper abdominal organs due to streak artifact.  There is evidence of mucus or debris within the trachea and mainstem bronchi.  Degenerative changes in the spine.  IMPRESSION: Technically limited study without evidence of large central pulmonary emboli.  Consolidation or atelectasis in both lung bases. Cardiac enlargement.  Similar appearance to previous study.  Original Report Authenticated By: Marlon Pel, M.D.   Dg Chest Port 1 View  08/27/2011  *RADIOLOGY REPORT*  Clinical Data: Follow up infiltrate.  PORTABLE CHEST - 1 VIEW  Comparison: 08/24/2011  Findings:  Left Port-A-Cath and tracheostomy unchanged.  Bilateral lower lobe opacities are again noted, likely not significantly changed.  Mild cardiomegaly.  Left lung base is not  well visualized due to patient positioning.  IMPRESSION: Bibasilar opacities, likely not significantly changed.  Cardiomegaly.  Original Report Authenticated By: Cyndie Chime, M.D.   Dg Chest Port 1 View  08/24/2011  *RADIOLOGY REPORT*  Clinical Data: Shortness of breath and weakness  PORTABLE CHEST - 1 VIEW  Comparison: 07/24/2011; 11/05/2010; chest CT - 07/25/2011  Findings: Grossly unchanged cardiac silhouette and mediastinal contours.  Stable position of support apparatus.  There is persistent mild elevation of the right hemidiaphragm with grossly unchanged bibasilar heterogeneous opacities, right greater than left.  No neural focal airspace opacities.  No pleural effusion or pneumothorax.  Unchanged bones.  IMPRESSION: Grossly  unchanged bibasilar opacities, right greater than left, atelectasis versus infiltrate.  Original Report Authenticated By: Waynard Reeds, M.D.    Medications: Scheduled Meds:   . amLODipine  10 mg Oral Daily  . antiseptic oral rinse  15 mL Mouth Rinse BID  . calcium-vitamin D  1 tablet Oral q morning - 10a  . clonazePAM  0.5 mg Oral BID  . docusate sodium  100 mg Oral BID  . enoxaparin (LOVENOX) injection  1 mg/kg Subcutaneous Once  . enoxaparin (LOVENOX) injection  40 mg Subcutaneous Q24H  . feeding supplement  30 mL Oral BID  . ferrous sulfate  325 mg Oral Q breakfast  . furosemide  20 mg Oral Daily  . ipratropium  0.5 mg Nebulization Q6H  . ketorolac  15 mg Intravenous Q8H  . lactulose  20 g Oral BID  . levalbuterol  0.63 mg Nebulization Q6H  . levofloxacin  750 mg Oral Daily  . multivitamin with minerals  5 mL Oral Daily  . OLANZapine  15 mg Oral QHS  . omega-3 acid ethyl esters  2 g Oral BID  . oxybutynin  5 mg Oral BID  . pantoprazole  40 mg Oral Q1200  . piperacillin-tazobactam (ZOSYN)  IV  3.375 g Intravenous Q8H  . polycarbophil  625 mg Oral QID  . polyethylene glycol  17 g Oral BID  . polyvinyl alcohol  1 drop Both Eyes QID  . traZODone  100  mg Oral QHS  . venlafaxine  100 mg Oral BID  . vitamin C  500 mg Oral q morning - 10a  . DISCONTD: amLODipine  5 mg Oral Daily  . DISCONTD: enoxaparin (LOVENOX) injection  40 mg Subcutaneous Q24H   Continuous Infusions:   . sodium chloride 500 mL (08/27/11 1358)     Assessment/Plan: Principal Problem:  *Healthcare-associated pneumonia with acute hypoxic respiratory failure - Improving, continue supplemental O2 via trach, CT angiogram of the chest, limited study, no large PE - Continue empiric antibiotic therapy, follow cultures Active Problems:  Acute respiratory failure with hypoxia with tracheostomy  As #1    Neurogenic bladder with chronic Foley catheter with gram-negative pyelonephritis - Continue current antibiotic treatment, follow urine cultures   Quadriplegia:    Schizophrenia: Stable  DVT Prophylaxis: Lovenox  Code Status:  Disposition: Hopefully DC to SNF tomorrow pending culture results    LOS: 5 days   Haron Beilke M.D. [facility] 08/29/2011, 12:40 PM Pager: [phone]  If 7PM-7AM, please contact night-coverage [email or web address] Password [email or web address]

## 2011-08-29 NOTE — Progress Notes (Signed)
Suctioned pt, noted that pt had very thick/yellow secretions coming out; paged respiratory to do deep suctioning; will continue to monitor

## 2011-08-30 DIAGNOSIS — J189 Pneumonia, unspecified organism: Secondary | ICD-10-CM

## 2011-08-30 DIAGNOSIS — N390 Urinary tract infection, site not specified: Secondary | ICD-10-CM

## 2011-08-30 LAB — CULTURE, BLOOD (ROUTINE X 2): Culture: NO GROWTH

## 2011-08-30 LAB — URINE CULTURE: Colony Count: >=100000
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
## 2011-08-30 NOTE — Progress Notes (Signed)
Patient ID: [name]  female  ZOX:[id number]    DOB: 12-13-1965    DOA: 08/24/2011  PCP: Terald Sleeper, MD  Subjective: Feels cold and shivering, afebrile, feels congested. No nausea, vomiting, abdominal  pain  Objective: Weight change:   Intake/Output Summary (Last 24 hours) at 08/30/11 1502 Last data filed at 08/30/11 1258  Gross per 24 hour  Intake    720 ml  Output   3600 ml  Net  -2880 ml   Blood pressure 113/56, pulse 90, temperature 98.5 F (36.9 C), temperature source Oral, resp. rate 16, height 5\' 2"  (1.575 m), weight 100 kg (220 lb 7.4 oz), SpO2 95.00%.  Physical Exam: General: Alert and awake, oriented x3, not in any acute distress. HEENT: anicteric sclera, pupils reactive to light and accommodation, EOMI CVS: S1-S2 clear, no murmur rubs or gallops Chest: Bibasilar crackles Abdomen: Obese, soft nontender, G-tube, normal bowel sounds, no organomegaly Extremities: no cyanosis, clubbing. + edema noted bilaterally Neuro: Cranial nerves II-XII intact, no focal neurological deficits  Lab Results: Basic Metabolic Panel:  Lab 08/29/11 8119 08/28/11 0453  NA 140 143  K 4.3 4.4  CL 99 101  CO2 29 30  GLUCOSE 97 99  BUN 32* 27*  CREATININE 1.32* 1.09  CALCIUM 8.8 9.0  MG -- --  PHOS -- --   Liver Function Tests:  Lab 08/24/11 1620  AST 15  ALT 10  ALKPHOS 81  BILITOT 0.2*  PROT 8.5*  ALBUMIN 3.4*   No results found for this basename: LIPASE:2,AMYLASE:2 in the last 168 hours No results found for this basename: AMMONIA:2 in the last 168 hours CBC:  Lab 08/29/11 0445 08/28/11 0453 08/24/11 1620  WBC 8.8 8.1 --  NEUTROABS -- -- 3.5  HGB 9.1* 9.8* --  HCT 30.3* 32.7* --  MCV 77.5* 77.7* --  PLT 171 173 --   Cardiac Enzymes: No results found for this basename: CKTOTAL:3,CKMB:3,CKMBINDEX:3,TROPONINI:3 in the last 168 hours BNP: No components found with this basename: POCBNP:2 CBG: No results found for this basename: GLUCAP:5 in the last 168  hours   Micro Results: Recent Results (from the past 240 hour(s))  CULTURE, BLOOD (ROUTINE X 2)     Status: Normal   Collection Time   08/24/11  6:10 PM      Component Value Range Status Comment   Specimen Description BLOOD SHOULDER RIGHT   Final    Special Requests BOTTLES DRAWN AEROBIC AND ANAEROBIC 3CC EA   Final    Culture  Setup Time 08/24/2011 22:07   Final    Culture NO GROWTH 5 DAYS   Final    Report Status 08/30/2011 FINAL   Final   CULTURE, BLOOD (ROUTINE X 2)     Status: Normal   Collection Time   08/24/11  6:20 PM      Component Value Range Status Comment   Specimen Description BLOOD SHOULDER RIGHT   Final    Special Requests BOTTLES DRAWN AEROBIC AND ANAEROBIC 5CC EA   Final    Culture  Setup Time 08/24/2011 22:06   Final    Culture NO GROWTH 5 DAYS   Final    Report Status 08/30/2011 FINAL   Final   URINE CULTURE     Status: Normal   Collection Time   08/24/11 11:21 PM      Component Value Range Status Comment   Specimen Description URINE, CATHETERIZED   Final    Special Requests NONE   Final    Culture  Setup Time 08/25/2011 04:39   Final    Colony Count >=100,000 COLONIES/ML   Final    Culture     Final    Value: MORGANELLA MORGANII     18 Note: CRITICAL RESULT CALLED TO, READ BACK BY AND VERIFIED WITH: DAFFY @2 :20 PM BY NGUYK 7 13 REPORT FAXED BY REQUEST     PSEUDOMONAS AERUGINOSA     Note: COLISTIN SENSITIVE=2ug/mL ETEST results for this drug are "FOR INVESTIGATIONAL USE ONLY" and should NOT be used for clinical purposes.   Report Status 08/30/2011 FINAL   Final    Organism ID, Bacteria MORGANELLA MORGANII   Final    Organism ID, Bacteria PSEUDOMONAS AERUGINOSA   Final   MRSA PCR SCREENING     Status: Normal   Collection Time   08/25/11  1:46 AM      Component Value Range Status Comment   MRSA by PCR NEGATIVE  NEGATIVE Final     Studies/Results: Dg Abd 1 View  08/24/2011  *RADIOLOGY REPORT*  Clinical Data: Abdominal pain, nausea and constipation.  ABDOMEN  - 1 VIEW  Comparison: 11/01/2010.  Findings: Stool is seen throughout the colon.  Spinal stimulator is in place.  IMPRESSION: Severe constipation.  Original Report Authenticated By: Reyes Ivan, M.D.   Ct Angio Chest W/cm &/or Wo Cm  08/29/2011  *RADIOLOGY REPORT*  Clinical Data: Chest pain.  CT ANGIOGRAPHY CHEST  Technique:  Multidetector CT imaging of the chest using the standard protocol during bolus administration of intravenous contrast. Multiplanar reconstructed images including MIPs were obtained and reviewed to evaluate the vascular anatomy.  Contrast: OMNIPAQUE IOHEXOL 350 MG/ML SOLN  Comparison: 07/25/2011.  Findings: The contrast bolus is somewhat marginal although there is reasonable visualization of the central and proximal segmental pulmonary arteries.  No gross filling defects are demonstrated in the visualized vessels suggesting no evidence of significant pulmonary embolus.  Peripheral emboli cannot be excluded.  Diffuse cardiac enlargement.  Normal caliber thoracic aorta.  No significant lymphadenopathy in the chest.  Tracheostomy.  Motion artifact limits visualization of the lungs.  There appears to be consolidation or atelectasis in both lung bases.  Changes are similar to previous study.  No pneumothorax.  Limited visualization of upper abdominal organs due to streak artifact.  There is evidence of mucus or debris within the trachea and mainstem bronchi.  Degenerative changes in the spine.  IMPRESSION: Technically limited study without evidence of large central pulmonary emboli.  Consolidation or atelectasis in both lung bases. Cardiac enlargement.  Similar appearance to previous study.  Original Report Authenticated By: Marlon Pel, M.D.   Dg Chest Port 1 View  08/27/2011  *RADIOLOGY REPORT*  Clinical Data: Follow up infiltrate.  PORTABLE CHEST - 1 VIEW  Comparison: 08/24/2011  Findings:  Left Port-A-Cath and tracheostomy unchanged.  Bilateral lower lobe opacities are  again noted, likely not significantly changed.  Mild cardiomegaly.  Left lung base is not well visualized due to patient positioning.  IMPRESSION: Bibasilar opacities, likely not significantly changed.  Cardiomegaly.  Original Report Authenticated By: Cyndie Chime, M.D.   Dg Chest Port 1 View  08/24/2011  *RADIOLOGY REPORT*  Clinical Data: Shortness of breath and weakness  PORTABLE CHEST - 1 VIEW  Comparison: 07/24/2011; 11/05/2010; chest CT - 07/25/2011  Findings: Grossly unchanged cardiac silhouette and mediastinal contours.  Stable position of support apparatus.  There is persistent mild elevation of the right hemidiaphragm with grossly unchanged bibasilar heterogeneous opacities, right greater than left.  No  neural focal airspace opacities.  No pleural effusion or pneumothorax.  Unchanged bones.  IMPRESSION: Grossly unchanged bibasilar opacities, right greater than left, atelectasis versus infiltrate.  Original Report Authenticated By: Waynard Reeds, M.D.    Medications: Scheduled Meds:    . antiseptic oral rinse  15 mL Mouth Rinse BID  . calcium-vitamin D  1 tablet Oral q morning - 10a  . clonazePAM  0.5 mg Oral BID  . docusate sodium  100 mg Oral BID  . enoxaparin (LOVENOX) injection  40 mg Subcutaneous Q24H  . feeding supplement  30 mL Oral BID  . ferrous sulfate  325 mg Oral Q breakfast  . ipratropium  0.5 mg Nebulization Q6H  . ketorolac  15 mg Intravenous Q8H  . lactulose  20 g Oral BID  . levalbuterol  0.63 mg Nebulization Q6H  . levofloxacin  750 mg Oral Daily  . multivitamin with minerals  5 mL Oral Daily  . OLANZapine  15 mg Oral QHS  . omega-3 acid ethyl esters  2 g Oral BID  . oxybutynin  5 mg Oral BID  . pantoprazole  40 mg Oral Q1200  . piperacillin-tazobactam (ZOSYN)  IV  3.375 g Intravenous Q8H  . polycarbophil  625 mg Oral QID  . polyethylene glycol  17 g Oral BID  . polyvinyl alcohol  1 drop Both Eyes QID  . traZODone  100 mg Oral QHS  . venlafaxine  100 mg  Oral BID  . vitamin C  500 mg Oral q morning - 10a  . DISCONTD: amLODipine  10 mg Oral Daily  . DISCONTD: amLODipine  10 mg Oral Daily  . DISCONTD: furosemide  20 mg Oral Daily   Continuous Infusions:    . sodium chloride 500 mL (08/27/11 1358)     Assessment/Plan: Principal Problem:   *Healthcare-associated pneumonia with acute hypoxic respiratory failure - Improving, continue supplemental O2 via trach, CT angiogram of the chest showed limited study, no large PE - Continue empiric antibiotic therapy, blood cultures remain negative to date  Hypotension: Hold all antihypertensives - Patient does have gram-negative and arthritis but does not appear to be septic, no leukocytosis or any fevers - I will order blood cultures, initially was thought due to hypovolemia and dehydration with possibility of early sepsis.  - DC'd her Norvasc and Lasix.  Active Problems:  Acute respiratory failure with hypoxia with tracheostomy  As #1    Neurogenic bladder with chronic Foley catheter with gram-negative pyelonephritis - Urine culture obtained, showed Morganella and Pseudomonas, MDR organisms, resistant to most antibiotics on sensitivity. Infectious disease consult called, will await recommendations from Dr. Orvan Falconer. - Will continue on levofloxacin and Zosyn for now   Quadriplegia:    Schizophrenia: Stable  DVT Prophylaxis: Lovenox  Code Status: Full code  Disposition: Awaiting ID recommendations.   LOS: 6 days   Iracema Lanagan M.D. [facility] 08/30/2011, 3:02 PM Pager: [phone]  If 7PM-7AM, please contact night-coverage [email or web address] Password [email or web address]
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
## 2011-08-30 NOTE — Progress Notes (Signed)
Critical urine culture result found: >100,000 colonies (2 organisms) that are multidrug resistant. Had a copy of the report faxed to our unit for review and is placed in [facility]-a[facility]ro[facility]. Dr. Isidoro Donning notified. Will continue to monitor. Ramond Craver, RN

## 2011-08-31 MED ORDER — LEVOFLOXACIN 750 MG PO TABS: 750.0000 mg | ORAL_TABLET | Freq: Every day | ORAL | Status: AC

## 2011-08-31 MED ORDER — SODIUM CHLORIDE 0.9 % IJ SOLN: 10.0000 mL | INTRAMUSCULAR | Status: DC | PRN

## 2011-08-31 MED ORDER — HEPARIN SOD (PORK) LOCK FLUSH 100 UNIT/ML IV SOLN
500.0000 [IU] | Freq: Once | INTRAVENOUS | Status: AC
  Administered 2011-08-31: 500 [IU] via INTRAVENOUS

## 2011-08-31 MED ORDER — PIPERACILLIN-TAZOBACTAM 3.375 G IVPB: 3.3750 g | Freq: Three times a day (TID) | INTRAVENOUS | Status: DC

## 2011-08-31 MED ORDER — POLYETHYLENE GLYCOL 3350 17 G PO PACK: 17.0000 g | PACK | Freq: Two times a day (BID) | ORAL | Status: DC

## 2011-08-31 MED ORDER — LEVALBUTEROL HCL 0.63 MG/3ML IN NEBU: 0.6300 mg | INHALATION_SOLUTION | Freq: Four times a day (QID) | RESPIRATORY_TRACT | Status: DC

## 2011-08-31 MED ORDER — ONDANSETRON HCL 4 MG PO TABS: 4.0000 mg | ORAL_TABLET | Freq: Four times a day (QID) | ORAL | Status: AC | PRN

## 2011-08-31 MED ORDER — LACTULOSE 10 GM/15ML PO SOLN: 20.0000 g | Freq: Two times a day (BID) | ORAL | Status: AC

## 2011-08-31 MED ORDER — OXYCODONE HCL 5 MG PO TABS: 5.0000 mg | ORAL_TABLET | ORAL | Status: AC | PRN

## 2011-08-31 MED ORDER — ADULT MULTIVITAMIN LIQUID CH
5.0000 mL | Freq: Every day | ORAL | Status: DC
  Filled 2011-08-31: qty 5

## 2011-08-31 MED ORDER — LEVALBUTEROL HCL 0.63 MG/3ML IN NEBU: 0.6300 mg | INHALATION_SOLUTION | RESPIRATORY_TRACT | Status: DC | PRN

## 2011-08-31 MED ORDER — CLONAZEPAM 0.5 MG PO TABS: 0.5000 mg | ORAL_TABLET | Freq: Two times a day (BID) | ORAL | Status: DC

## 2011-08-31 MED ORDER — FUROSEMIDE 20 MG PO TABS: 20.0000 mg | ORAL_TABLET | Freq: Every day | ORAL | Status: DC | PRN

## 2011-08-31 MED ORDER — BIOTENE DRY MOUTH MT LIQD: 15.0000 mL | Freq: Two times a day (BID) | OROMUCOSAL | Status: DC

## 2011-08-31 NOTE — Progress Notes (Signed)
Patient discharged to [facility].  Report given to Nurse at [facility].  Transported by [facility].  Colman Cater

## 2011-08-31 NOTE — Care Management Note (Signed)
    Page 1 of 1   08/31/2011     3:21:04 PM   CARE MANAGEMENT NOTE 08/31/2011  Patient:  [name], [name]   Account Number:  [id number]  Date Initiated:  08/27/2011  Documentation initiated by:  Alvira Philips Assessment:   46 yr-old female adm with dx of PNA; resident of [facility].     Action/Plan:   Anticipated DC Date:  08/31/2011   Anticipated DC Plan:  SKILLED NURSING FACILITY  In-house referral  Clinical Social Worker      DC Planning Services  CM consult      Choice offered to / List presented to:             Status of service:  Completed, signed off Medicare Important Message given?   (If response is "NO", the following Medicare IM given date fields will be blank) Date Medicare IM given:   Date Additional Medicare IM given:    Discharge Disposition:  SKILLED NURSING FACILITY  Per UR Regulation:  Reviewed for med. necessity/level of care/duration of stay  If discussed at Long Length of Stay Meetings, dates discussed:    Comments:  PCP:  Dr. Baltazar Najjar  08/31/11- 1500- Donn Pierini RN, BSN [phone] Pt to return to SNF today, CSW following for d/c needs

## 2011-08-31 NOTE — Progress Notes (Signed)
Clinical Social Work  CSW faxed dc summary and medications to SNF. SNF agreeable to admission. CSW informed patient, RN and patient's sister of dc and all parties were agreeable. CSW prepared dc packet. CSW coordinated transportation via [place]. CSW is signing off.  [place], [place] [phone]

## 2011-08-31 NOTE — Progress Notes (Signed)
Clinical Social Work  CSW received call from CM inquiring if SNF could accept patient back needing suctioning, on contact precautions and IV antibiotics. CSW called SNF [facility]) who is agreeable to admission with these needs. CSW text paged MD this information. CSW will continue to follow and will assist with dc needs.  [place], [place] [phone]

## 2011-08-31 NOTE — Discharge Summary (Addendum)
Physician Discharge Summary  Patient ID: [name] MRN: [id number] DOB/AGE: 1965-07-25 46 y.o.  Admit date: 08/24/2011 Discharge date: 08/31/2011  Primary Care Physician:  Terald Sleeper, MD  Discharge Diagnoses:    .Healthcare-associated pneumonia .Hypoxemia .Acute respiratory failure with hypoxia .Neurogenic bladder with chronic Foley catheter .Quadriplegia .Schizophrenia .Anemia of chronic disease .Anxiety disorder Multidrug-resistant UTI (Morganella, Pseudomonas), needs one more day of IV antibiotics  Consults:  Infectious disease, Dr. Orvan Falconer   Discharge Medications: Medication List  As of 08/31/2011 11:21 AM   STOP taking these medications         albuterol (2.5 MG/3ML) 0.083% nebulizer solution      HYDROcodone-acetaminophen 5-500 MG per tablet         TAKE these medications         acetaminophen 325 MG tablet   Commonly known as: TYLENOL   Take 650 mg by mouth every 4 (four) hours as needed. For pain      antiseptic oral rinse Liqd   15 mLs by Mouth Rinse route 2 (two) times daily.      CERTA-VITE PO   Take 1 tablet by mouth every morning.      clonazePAM 0.5 MG tablet   Commonly known as: KLONOPIN   Take 1 tablet (0.5 mg total) by mouth 2 (two) times daily. Refill by facility MD      docusate sodium 100 MG capsule   Commonly known as: COLACE   Take 100 mg by mouth 2 (two) times daily.      feeding supplement Liqd   Take 30 mLs by mouth 2 (two) times daily.      ferrous sulfate 325 (65 FE) MG tablet   Take 325 mg by mouth daily with breakfast.      furosemide 20 MG tablet   Commonly known as: LASIX   Take 1 tablet (20 mg total) by mouth daily as needed. For fluid overload, congestion, shortness of breath      guaiFENesin 600 MG 12 hr tablet   Commonly known as: MUCINEX   Take 600 mg by mouth 2 (two) times daily as needed. CONGESTION      ipratropium 0.02 % nebulizer solution   Commonly known as: ATROVENT   Take 500 mcg by  nebulization every 6 (six) hours. With albuterol      lactulose 10 GM/15ML solution   Commonly known as: CHRONULAC   Take 30 mLs (20 g total) by mouth 2 (two) times daily. For constipation. Hold if more than 2 BM's/day      levalbuterol 0.63 MG/3ML nebulizer solution   Commonly known as: XOPENEX   Take 3 mLs (0.63 mg total) by nebulization every 6 (six) hours.      levalbuterol 0.63 MG/3ML nebulizer solution   Commonly known as: XOPENEX   Take 3 mLs (0.63 mg total) by nebulization every 2 (two) hours as needed for wheezing or shortness of breath.      levofloxacin 750 MG tablet   Commonly known as: LEVAQUIN   Take 1 tablet (750 mg total) by mouth daily. X 1 more day, then stop      methocarbamol 500 MG tablet   Commonly known as: ROBAXIN   Take 500 mg by mouth every 6 (six) hours as needed. For muscle spasms      OLANZapine 15 MG tablet   Commonly known as: ZYPREXA   Take 15 mg by mouth at bedtime.      omega-3 acid ethyl esters 1 G capsule  Commonly known as: LOVAZA   Take 2 g by mouth 2 (two) times daily.      omeprazole 20 MG capsule   Commonly known as: PRILOSEC   Take 20 mg by mouth 2 (two) times daily.      ondansetron 4 MG tablet   Commonly known as: ZOFRAN   Take 1 tablet (4 mg total) by mouth every 6 (six) hours as needed for nausea.      OSCAL 500/200 D-3 500-200 MG-UNIT per tablet   Generic drug: calcium-vitamin D   Take 1 tablet by mouth every morning.      oxybutynin 5 MG tablet   Commonly known as: DITROPAN   Take 5 mg by mouth 2 (two) times daily as needed. BLADDER SPASMS      oxyCODONE 5 MG immediate release tablet   Commonly known as: Oxy IR/ROXICODONE   Take 1 tablet (5 mg total) by mouth every 4 (four) hours as needed for pain. Refill by facility MD      piperacillin-tazobactam 3-0.375 GM/50ML IVPB   Commonly known as: ZOSYN   Inject 50 mLs (3.375 g total) into the vein every 8 (eight) hours. X 1 more day, then STOP      polycarbophil 625 MG  tablet   Commonly known as: FIBERCON   Take 625 mg by mouth every 6 (six) hours.      polyethylene glycol packet   Commonly known as: MIRALAX / GLYCOLAX   Take 17 g by mouth 2 (two) times daily.      senna 8.6 MG Tabs   Commonly known as: SENOKOT   Take 2 tablets by mouth daily as needed. For constipation      sodium chloride 0.9 % injection   10-40 mLs by Intracatheter route as needed (flush).      SYSTANE 0.4-0.3 % Soln   Generic drug: Polyethyl Glycol-Propyl Glycol   Place 1 drop into both eyes 4 (four) times daily.      traZODone 100 MG tablet   Commonly known as: DESYREL   Take 50-100 mg by mouth at bedtime. Take 100mg  every night at bedtime.  Take 50mg  tablet at bedtime as needed if 100mg  is ineffective for insomnia in 1 hr      traZODone 100 MG tablet   Commonly known as: DESYREL   Take 100 mg by mouth at bedtime.      venlafaxine 100 MG tablet   Commonly known as: EFFEXOR   Take 100 mg by mouth 2 (two) times daily.      vitamin C 500 MG tablet   Commonly known as: ASCORBIC ACID   Take 500 mg by mouth every morning.             Brief H and P: For complete details please refer to admission H and P, but in brief 46 year old female with Quadriplegia from the [facility] who was sent to the ED due to complaints of worsening SOB over the past week. She reported having increased tracheostomy secretions which have been yellow. She reported having fevers and chills. She was found to have decreased Oxygen saturations at the SNF on the day admission down to 80% and her oxygen was increased to 6 liters. The SNF had performed a Chest X-ray and found a RLL Pneumonia. In the ED she was placed on IV Antibiotic therapy to cover HCAP Pneumonia with IV Vanc and Zosyn. She was referred for medical admission.    Hospital Course:  *  Healthcare-associated pneumonia with acute hypoxic respiratory failure on tracheostomy: Patient was admitted to step down unit due to acute respiratory  failure with hypoxia. Patient was started on IV vancomycin and Zosyn, scheduled nebulizers, trach care and suctioning, O2 supplementation. Chest x-ray and CT scans revealed bibasal infiltrates. Blood cultures remained negative till date and no sputum was obtained for Gram stain and culture. Patient has received a week of empiric antibiotic therapy and has improved. Given hypoxia and prolonged bedbound immobility state, CTA of the chest was obtained to rule out PE. CT angiogram of the chest showed limited study, no large PE, Doppler ultrasound of the lower extremeties were negative for any DVT.    Neurogenic bladder with chronic Foley catheter with gram-negative pyelonephritis: The patient has a chronic indwelling Foley catheter due to neurogenic bladder, however given high colony count on urine culture it was suspected that patient may have a true infection. And she was continued on antibiotics.  Urine culture obtained, showed Morganella and Pseudomonas, MDR organisms, panresistant to antibiotics on sensitivity. Infectious disease consult was called and patient was evaluated by Dr. Cliffton Asters. Per ID recommendations, continue one more day of Levaquin and Zosyn and then stop. Patient needs strict contact precautions. Foley catheter was changed on 08/27/2011.  Hypotension: It was likely due to hypovolemia/dehydration with possibility of early bacteremia however blood cultures remain negative to date and patient was placed on IV fluids. BP got stable with IV fluids. However subsequently developed volume overload with hypertension and then received Lasix with good diuresis of nearly 6 L of fluid on 08/27/2011. 2-D echocardiogram was obtained on 08/28/2011 which showed grossly normal systolic function however technically difficult study. Antihypertensives and Lasix were held as patient then subsequently developed borderline hypotension again in last 2 days. Her vital signs has been stable for 24 hours, it is  recommended to continue Lasix but PRN for congestion and dyspnea.   Quadriplegia: At baseline  Schizophrenia: Stable   Right-sided pleuritic chest pain: Likely secondary to underlying pneumonia process, has somewhat improved.    Day of Discharge BP 138/67  Pulse 102  Temp 98.1 F (36.7 C) (Oral)  Resp 20  Ht 5\' 2"  (1.575 m)  Wt 100 kg (220 lb 7.4 oz)  BMI 40.32 kg/m2  SpO2 97%  Physical Exam:  General: Alert and awake, oriented x3, not in any acute distress.  HEENT: anicteric sclera, pupils reactive to light and accommodation, EOMI  CVS: S1-S2 clear, no murmur rubs or gallops  Chest: Bibasilar crackles improving Abdomen: Obese, soft nontender, G-tube, normal bowel sounds, no organomegaly  Extremities: no cyanosis, clubbing. + edema noted bilaterally  Neuro: Cranial nerves II-XII intact, no focal neurological deficits   The results of significant diagnostics from this hospitalization (including imaging, microbiology, ancillary and laboratory) are listed below for reference.    LAB RESULTS: Basic Metabolic Panel:  Lab 08/29/11 1610 08/28/11 0453  NA 140 143  K 4.3 4.4  CL 99 101  CO2 29 30  GLUCOSE 97 99  BUN 32* 27*  CREATININE 1.32* 1.09  CALCIUM 8.8 9.0  MG -- --  PHOS -- --   Liver Function Tests:  Lab 08/24/11 1620  AST 15  ALT 10  ALKPHOS 81  BILITOT 0.2*  PROT 8.5*  ALBUMIN 3.4*   CBC:  Lab 08/29/11 0445 08/28/11 0453 08/24/11 1620  WBC 8.8 8.1 --  NEUTROABS -- -- 3.5  HGB 9.1* 9.8* --  HCT 30.3* 32.7* --  MCV 77.5* -- --  PLT  171 173 --    Significant Diagnostic Studies:  Dg Abd 1 View  08/24/2011  *RADIOLOGY REPORT*  Clinical Data: Abdominal pain, nausea and constipation.  ABDOMEN - 1 VIEW  Comparison: 11/01/2010.  Findings: Stool is seen throughout the colon.  Spinal stimulator is in place.  IMPRESSION: Severe constipation.  Original Report Authenticated By: Reyes Ivan, M.D.   Dg Chest Port 1 View  08/24/2011  *RADIOLOGY  REPORT*  Clinical Data: Shortness of breath and weakness  PORTABLE CHEST - 1 VIEW  Comparison: 07/24/2011; 11/05/2010; chest CT - 07/25/2011  Findings: Grossly unchanged cardiac silhouette and mediastinal contours.  Stable position of support apparatus.  There is persistent mild elevation of the right hemidiaphragm with grossly unchanged bibasilar heterogeneous opacities, right greater than left.  No neural focal airspace opacities.  No pleural effusion or pneumothorax.  Unchanged bones.  IMPRESSION: Grossly unchanged bibasilar opacities, right greater than left, atelectasis versus infiltrate.  Original Report Authenticated By: Waynard Reeds, M.D.     Disposition and Follow-up: Discharge Orders    Future Orders Please Complete By Expires   Increase activity slowly          DISPOSITION: Skilled nursing facility DIET: Patient is on regular diet and TF ACTIVITY: As tolerated  TESTS to follow-up: Please set up appointment for patient for left breast ultrasound and Mammogram at [facility], [address] [place], [place] [address]. [phone]   DISCHARGE FOLLOW-UP Follow-up Information    Follow up with Terald Sleeper, MD. Schedule an appointment as soon as possible for a visit in 7 days. (for hospital follow-up)    Contact information:   [address] [place] [address] [phone]          Time spent on Discharge: 45 minutes  Signed:   RAI,RIPUDEEP M.D. [facility] 08/31/2011, 11:21 AM Pager: [phone]  If 7PM-7AM, please contact night-coverage [email or web address] Password [email or web address]

## 2011-09-05 LAB — CULTURE, BLOOD (ROUTINE X 2)
Culture: NO GROWTH
Culture: NO GROWTH

## 2011-09-06 ENCOUNTER — Other Ambulatory Visit (HOSPITAL_BASED_OUTPATIENT_CLINIC_OR_DEPARTMENT_OTHER): Payer: Self-pay | Admitting: Internal Medicine

## 2011-09-06 DIAGNOSIS — G8250 Quadriplegia, unspecified: Secondary | ICD-10-CM

## 2011-09-06 DIAGNOSIS — N63 Unspecified lump in unspecified breast: Secondary | ICD-10-CM

## 2011-09-06 DIAGNOSIS — N644 Mastodynia: Secondary | ICD-10-CM

## 2011-09-13 ENCOUNTER — Ambulatory Visit
Admission: RE | Admit: 2011-09-13 | Discharge: 2011-09-13 | Disposition: A | Discharge status: Home / Self Care | Payer: Medicare Other | Source: Ambulatory Visit | Attending: Internal Medicine | Admitting: Internal Medicine

## 2011-09-13 DIAGNOSIS — G8250 Quadriplegia, unspecified: Secondary | ICD-10-CM

## 2011-09-13 DIAGNOSIS — N63 Unspecified lump in unspecified breast: Secondary | ICD-10-CM

## 2011-09-13 DIAGNOSIS — N644 Mastodynia: Secondary | ICD-10-CM

## 2011-10-19 ENCOUNTER — Encounter (HOSPITAL_COMMUNITY): Payer: Self-pay | Admitting: *Deleted

## 2011-10-19 ENCOUNTER — Emergency Department (HOSPITAL_COMMUNITY): Payer: Medicare Other

## 2011-10-19 ENCOUNTER — Inpatient Hospital Stay (HOSPITAL_COMMUNITY)
Admission: EM | Admit: 2011-10-19 | Discharge: 2011-10-26 | DRG: 207 | Disposition: A | Discharge status: Rehabilitation Facility | Payer: Medicare Other | Attending: Critical Care Medicine | Admitting: Critical Care Medicine

## 2011-10-19 DIAGNOSIS — E46 Unspecified protein-calorie malnutrition: Secondary | ICD-10-CM | POA: Diagnosis present

## 2011-10-19 DIAGNOSIS — J9620 Acute and chronic respiratory failure, unspecified whether with hypoxia or hypercapnia: Principal | ICD-10-CM | POA: Diagnosis present

## 2011-10-19 DIAGNOSIS — F419 Anxiety disorder, unspecified: Secondary | ICD-10-CM | POA: Diagnosis present

## 2011-10-19 DIAGNOSIS — Z6841 Body Mass Index (BMI) 40.0 and over, adult: Secondary | ICD-10-CM

## 2011-10-19 DIAGNOSIS — D638 Anemia in other chronic diseases classified elsewhere: Secondary | ICD-10-CM | POA: Diagnosis present

## 2011-10-19 DIAGNOSIS — F209 Schizophrenia, unspecified: Secondary | ICD-10-CM | POA: Diagnosis present

## 2011-10-19 DIAGNOSIS — L8990 Pressure ulcer of unspecified site, unspecified stage: Secondary | ICD-10-CM | POA: Diagnosis present

## 2011-10-19 DIAGNOSIS — G8250 Quadriplegia, unspecified: Secondary | ICD-10-CM | POA: Diagnosis present

## 2011-10-19 DIAGNOSIS — J9601 Acute respiratory failure with hypoxia: Secondary | ICD-10-CM | POA: Diagnosis present

## 2011-10-19 DIAGNOSIS — I248 Other forms of acute ischemic heart disease: Secondary | ICD-10-CM | POA: Diagnosis present

## 2011-10-19 DIAGNOSIS — N390 Urinary tract infection, site not specified: Secondary | ICD-10-CM | POA: Diagnosis present

## 2011-10-19 DIAGNOSIS — N319 Neuromuscular dysfunction of bladder, unspecified: Secondary | ICD-10-CM | POA: Diagnosis present

## 2011-10-19 DIAGNOSIS — L89899 Pressure ulcer of other site, unspecified stage: Secondary | ICD-10-CM | POA: Diagnosis present

## 2011-10-19 DIAGNOSIS — I2489 Other forms of acute ischemic heart disease: Secondary | ICD-10-CM | POA: Diagnosis present

## 2011-10-19 DIAGNOSIS — Z930 Tracheostomy status: Secondary | ICD-10-CM

## 2011-10-19 DIAGNOSIS — J189 Pneumonia, unspecified organism: Secondary | ICD-10-CM | POA: Diagnosis present

## 2011-10-19 DIAGNOSIS — F411 Generalized anxiety disorder: Secondary | ICD-10-CM | POA: Diagnosis present

## 2011-10-19 DIAGNOSIS — R0902 Hypoxemia: Secondary | ICD-10-CM | POA: Diagnosis present

## 2011-10-19 DIAGNOSIS — I10 Essential (primary) hypertension: Secondary | ICD-10-CM | POA: Diagnosis present

## 2011-10-19 LAB — COMPREHENSIVE METABOLIC PANEL
ALT: 13 U/L (ref 0–35)
AST: 16 U/L (ref 0–37)
Albumin: 3.3 g/dL — ABNORMAL LOW (ref 3.5–5.2)
Calcium: 9.9 mg/dL (ref 8.4–10.5)
Creatinine, Ser: 1.13 mg/dL — ABNORMAL HIGH (ref 0.50–1.10)
Sodium: 141 mEq/L (ref 135–145)
Total Protein: 8.3 g/dL (ref 6.0–8.3)

## 2011-10-19 LAB — POCT I-STAT TROPONIN I: Troponin i, poc: 0.1 ng/mL (ref 0.00–0.08)

## 2011-10-19 LAB — POCT I-STAT 3, ART BLOOD GAS (G3+)
O2 Saturation: 94 %
pCO2 arterial: 74.9 mmHg (ref 35.0–45.0)
pH, Arterial: 7.255 — ABNORMAL LOW (ref 7.350–7.450)

## 2011-10-19 LAB — URINALYSIS, ROUTINE W REFLEX MICROSCOPIC
Bilirubin Urine: NEGATIVE
Glucose, UA: NEGATIVE mg/dL
Specific Gravity, Urine: 1.011 (ref 1.005–1.030)
Urobilinogen, UA: 0.2 mg/dL (ref 0.0–1.0)
pH: 8.5 — ABNORMAL HIGH (ref 5.0–8.0)

## 2011-10-19 LAB — POCT I-STAT, CHEM 8
BUN: 35 mg/dL — ABNORMAL HIGH (ref 6–23)
Chloride: 103 mEq/L (ref 96–112)
Sodium: 141 mEq/L (ref 135–145)

## 2011-10-19 LAB — URINE MICROSCOPIC-ADD ON

## 2011-10-19 LAB — CBC
MCH: 23.4 pg — ABNORMAL LOW (ref 26.0–34.0)
MCV: 78.8 fL (ref 78.0–100.0)
Platelets: 202 10*3/uL (ref 150–400)
RBC: 4.48 MIL/uL (ref 3.87–5.11)
RDW: 13.6 % (ref 11.5–15.5)

## 2011-10-19 LAB — MAGNESIUM: Magnesium: 2.4 mg/dL (ref 1.5–2.5)

## 2011-10-19 MED ORDER — NITROGLYCERIN 0.4 MG SL SUBL
0.4000 mg | SUBLINGUAL_TABLET | SUBLINGUAL | Status: DC | PRN
  Administered 2011-10-19: 0.4 mg via SUBLINGUAL

## 2011-10-19 MED ORDER — LEVOFLOXACIN IN D5W 750 MG/150ML IV SOLN
750.0000 mg | INTRAVENOUS | Status: DC
  Administered 2011-10-19: 750 mg via INTRAVENOUS
  Filled 2011-10-19: qty 150

## 2011-10-19 MED ORDER — VANCOMYCIN HCL 1000 MG IV SOLR
2000.0000 mg | Freq: Once | INTRAVENOUS | Status: AC
  Administered 2011-10-19: 2000 mg via INTRAVENOUS
  Filled 2011-10-19: qty 2000

## 2011-10-19 MED ORDER — MORPHINE SULFATE 4 MG/ML IJ SOLN
4.0000 mg | Freq: Once | INTRAMUSCULAR | Status: AC
  Administered 2011-10-19: 4 mg via INTRAVENOUS
  Filled 2011-10-19: qty 1

## 2011-10-19 MED ORDER — ASPIRIN 81 MG PO CHEW
324.0000 mg | CHEWABLE_TABLET | Freq: Once | ORAL | Status: DC
  Filled 2011-10-19: qty 1

## 2011-10-19 MED ORDER — SODIUM CHLORIDE 0.9 % IV SOLN
1000.0000 mL | INTRAVENOUS | Status: DC
  Administered 2011-10-19 (×2): 1000 mL via INTRAVENOUS

## 2011-10-19 MED ORDER — VANCOMYCIN HCL IN DEXTROSE 1-5 GM/200ML-% IV SOLN
1000.0000 mg | Freq: Two times a day (BID) | INTRAVENOUS | Status: DC
  Filled 2011-10-19: qty 200

## 2011-10-19 MED ORDER — ONDANSETRON HCL 4 MG/2ML IJ SOLN
4.0000 mg | Freq: Once | INTRAMUSCULAR | Status: AC
  Administered 2011-10-19: 4 mg via INTRAVENOUS
  Filled 2011-10-19: qty 2

## 2011-10-19 MED ORDER — DEXTROSE 5 % IV SOLN
1.0000 g | Freq: Three times a day (TID) | INTRAVENOUS | Status: DC
  Administered 2011-10-19: 1 g via INTRAVENOUS
  Filled 2011-10-19 (×3): qty 1

## 2011-10-19 NOTE — ED Notes (Signed)
PT had BM; was changed and turned, and drink given and channel changed.

## 2011-10-19 NOTE — ED Notes (Signed)
IV team paged for port access 

## 2011-10-19 NOTE — ED Notes (Signed)
Pt. Has a Left chest PORT-A_CATH.  Pt. Wants it accessed.  IV Team called.

## 2011-10-19 NOTE — ED Notes (Signed)
Patient is resting comfortably. 

## 2011-10-19 NOTE — ED Notes (Signed)
Pt. Also received ASA 324mg  and One Nitro SL with no change in pain complaint.

## 2011-10-19 NOTE — ED Notes (Signed)
Respiratory paged for blood gas

## 2011-10-19 NOTE — ED Provider Notes (Signed)
History     CSN: [id number]  Arrival date & time 10/19/11  9147   First MD Initiated Contact with Patient 10/19/11 1821      Chief Complaint  Patient presents with  . Chest Pain  . Tracheostomy Tube Change    (Consider location/radiation/quality/duration/timing/severity/associated sxs/prior treatment) HPI  46 y.o. female quadraplegic with respirtory failure and tracheostomy and neurogenic bladder with chronic indwelling Foley c/o bleeding from trach and chest pain x3 days. Chest pain is described as 6/10 over anterior chest wall exacerbated with cough. She also reports somnolence. Janina Mayo was changed 1 month ago. Pt also reports fever and chills x1 month. Denies fever, N/V, change in bowel or bladder habits.   Past Medical History  Diagnosis Date  . Respiratory failure   . Dysphagia   . Contracture of hand joint   . Anemia   . Neurogenic bladder   . Anxiety   . Schizophrenia   . Quadriplegia     Past Surgical History  Procedure Date  . Tracheostomy   . Gastrostomy w/ feeding tube     History reviewed. No pertinent family history.  History  Substance Use Topics  . Smoking status: Never Smoker   . Smokeless tobacco: Not on file  . Alcohol Use: No    OB History    Grav Para Term Preterm Abortions TAB SAB Ect Mult Living                  Review of Systems  Constitutional: Negative for fever.  Respiratory: Positive for shortness of breath.   Cardiovascular: Positive for chest pain.  Gastrointestinal: Negative for nausea, vomiting, abdominal pain and diarrhea.  Skin: Positive for wound.  All other systems reviewed and are negative.    Allergies  Latex  Home Medications   Current Outpatient Rx  Name Route Sig Dispense Refill  . BIOTENE DRY MOUTH MT LIQD Mouth Rinse 15 mLs by Mouth Rinse route 2 (two) times daily.    Marland Kitchen CALCIUM CARBONATE-VITAMIN D 500-200 MG-UNIT PO TABS Oral Take 1 tablet by mouth every morning.    Marland Kitchen CLONAZEPAM 0.5 MG PO TABS Oral Take 1  tablet (0.5 mg total) by mouth 2 (two) times daily. Refill by facility MD 10 tablet 0  . DOCUSATE SODIUM 100 MG PO CAPS Oral Take 100 mg by mouth 2 (two) times daily.    Marland Kitchen PRO-STAT 64 PO LIQD Oral Take 30 mLs by mouth 2 (two) times daily.     Marland Kitchen FERROUS SULFATE 325 (65 FE) MG PO TABS Oral Take 325 mg by mouth daily with breakfast.    . FUROSEMIDE 20 MG PO TABS Oral Take 1 tablet (20 mg total) by mouth daily as needed. For fluid overload, congestion, shortness of breath 30 tablet   . GUAIFENESIN ER 600 MG PO TB12 Oral Take 600 mg by mouth 2 (two) times daily as needed. CONGESTION    . IPRATROPIUM BROMIDE 0.02 % IN SOLN Nebulization Take 500 mcg by nebulization every 6 (six) hours. With albuterol    . LEVALBUTEROL HCL 0.63 MG/3ML IN NEBU Nebulization Take 3 mLs (0.63 mg total) by nebulization every 6 (six) hours. 3 mL   . LEVALBUTEROL HCL 0.63 MG/3ML IN NEBU Nebulization Take 3 mLs (0.63 mg total) by nebulization every 2 (two) hours as needed for wheezing or shortness of breath. 3 mL   . METHOCARBAMOL 500 MG PO TABS Oral Take 500 mg by mouth every 6 (six) hours as needed. For muscle spasms    .  CERTA-VITE PO Oral Take 1 tablet by mouth every morning.     Marland Kitchen OLANZAPINE 15 MG PO TABS Oral Take 15 mg by mouth at bedtime.    . OMEGA-3-ACID ETHYL ESTERS 1 G PO CAPS Oral Take 2 g by mouth 2 (two) times daily.    Marland Kitchen OMEPRAZOLE 20 MG PO CPDR Oral Take 20 mg by mouth 2 (two) times daily.    . OXYBUTYNIN CHLORIDE 5 MG PO TABS Oral Take 5 mg by mouth 2 (two) times daily as needed. BLADDER SPASMS    . PIPERACILLIN-TAZOBACTAM 3.375 G IVPB Intravenous Inject 50 mLs (3.375 g total) into the vein every 8 (eight) hours. X 1 more day, then STOP 150 mL 0  . CALCIUM POLYCARBOPHIL 625 MG PO TABS Oral Take 625 mg by mouth every 6 (six) hours.    Marland Kitchen POLYETHYL GLYCOL-PROPYL GLYCOL 0.4-0.3 % OP SOLN Both Eyes Place 1 drop into both eyes 4 (four) times daily.     Marland Kitchen POLYETHYLENE GLYCOL 3350 PO PACK Oral Take 17 g by mouth 2 (two)  times daily. 14 each   . SENNA 8.6 MG PO TABS Oral Take 2 tablets by mouth daily as needed. For constipation    . SODIUM CHLORIDE 0.9 % IJ SOLN Intracatheter 10-40 mLs by Intracatheter route as needed (flush). 5 mL   . TRAZODONE HCL 100 MG PO TABS Oral Take 100 mg by mouth at bedtime.    . VENLAFAXINE HCL 100 MG PO TABS Oral Take 100 mg by mouth 2 (two) times daily.    Marland Kitchen VITAMIN C 500 MG PO TABS Oral Take 500 mg by mouth every morning.       BP 220/122  Temp 98.7 F (37.1 C) (Oral)  Resp 24  SpO2 90%  Physical Exam  Nursing note and vitals reviewed. Constitutional: She is oriented to person, place, and time. She appears well-developed and well-nourished. No distress.  HENT:  Head: Normocephalic and atraumatic.  Eyes: Conjunctivae and EOM are normal. Pupils are equal, round, and reactive to light.  Neck: Normal range of motion. Neck supple.       Trach in place   Cardiovascular: Regular rhythm, normal heart sounds and intact distal pulses.        tachycardic   Pulmonary/Chest: Effort normal and breath sounds normal. No respiratory distress. She has no wheezes. She has no rales. She exhibits no tenderness.       Janina Mayo is patent, poor air movement  Abdominal: Soft. Bowel sounds are normal.       G tube in place  Musculoskeletal: Normal range of motion.  Neurological: She is alert and oriented to person, place, and time.  Skin: Skin is warm.  Psychiatric: She has a normal mood and affect.    ED Course  Procedures (including critical care time)  Labs Reviewed  CBC - Abnormal; Notable for the following:    WBC 14.4 (*)     Hemoglobin 10.5 (*)     HCT 35.3 (*)     MCH 23.4 (*)     MCHC 29.7 (*)     All other components within normal limits  COMPREHENSIVE METABOLIC PANEL - Abnormal; Notable for the following:    CO2 33 (*)     Glucose, Bld 100 (*)     BUN 35 (*)     Creatinine, Ser 1.13 (*)     Albumin 3.3 (*)     Total Bilirubin 0.1 (*)     GFR calc non Af  Amer 57 (*)       GFR calc Af Amer 66 (*)     All other components within normal limits  URINALYSIS, ROUTINE W REFLEX MICROSCOPIC - Abnormal; Notable for the following:    APPearance TURBID (*)     pH 8.5 (*)     Hgb urine dipstick MODERATE (*)     Protein, ur 100 (*)     Nitrite POSITIVE (*)     Leukocytes, UA LARGE (*)     All other components within normal limits  URINE MICROSCOPIC-ADD ON - Abnormal; Notable for the following:    Squamous Epithelial / LPF MANY (*)     Bacteria, UA MANY (*)     Crystals TRIPLE PHOSPHATE CRYSTALS (*)     All other components within normal limits  POCT I-STAT, CHEM 8 - Abnormal; Notable for the following:    BUN 35 (*)     Creatinine, Ser 1.20 (*)     Glucose, Bld 100 (*)     Hemoglobin 11.2 (*)     HCT 33.0 (*)     All other components within normal limits  POCT I-STAT TROPONIN I - Abnormal; Notable for the following:    Troponin i, poc 0.10 (*)     All other components within normal limits  POCT I-STAT 3, BLOOD GAS (G3+) - Abnormal; Notable for the following:    pH, Arterial 7.255 (*)     pCO2 arterial 74.9 (*)     Bicarbonate 33.2 (*)     Acid-Base Excess 4.0 (*)     All other components within normal limits  MAGNESIUM  LACTIC ACID, PLASMA  BLOOD GAS, ARTERIAL  CULTURE, EXPECTORATED SPUTUM-ASSESSMENT   Dg Chest Portable 1 View  10/19/2011  *RADIOLOGY REPORT*  Clinical Data: Chest pain.  Tracheostomy tube change.  PORTABLE CHEST - 1 VIEW  Comparison: Chest radiograph 08/24/2011  Findings: Tracheostomy tube projects over the proximal tracheal column.  Left-sided Port-A-Cath terminates in the distal superior vena cava.  Stable cardiomegaly.  Left costophrenic angle excluded from the image.  Bibasilar opacities likely reflect atelectasis, but airspace disease is not excluded.  No evidence of pneumothorax.  IMPRESSION:  1.  The tracheostomy tube appears appropriately positioned by chest radiograph. 2.  Stable cardiomegaly. 3.  Bibasilar opacities may reflect  atelectasis or airspace disease.   Original Report Authenticated By: Britta Mccreedy, M.D.      Date: 10/19/2011  Rate: 133  Rhythm: sinus tachycardia  QRS Axis: right  Intervals: normal  ST/T Wave abnormalities: nonspecific ST/T changes  Conduction Disutrbances:none  Narrative Interpretation:   Old EKG Reviewed: unchanged  CRITICAL CARE Performed by: Wynetta Emery   Total critical care time: 20  Critical care time was exclusive of separately billable procedures and treating other patients.  Critical care was necessary to treat or prevent imminent or life-threatening deterioration.  Critical care was time spent personally by me on the following activities: development of treatment plan with patient and/or surrogate as well as nursing, discussions with consultants, evaluation of patient's response to treatment, examination of patient, obtaining history from patient or surrogate, ordering and performing treatments and interventions, ordering and review of laboratory studies, ordering and review of radiographic studies, pulse oximetry and re-evaluation of patient's condition.   1. Recurrent pneumonia   2. UTI (lower urinary tract infection)       MDM  46 y.o. Quadriplegic female with chest pain and SOB x3 days and fever chills x 1 month.  Saturation rate on  room air is 90% and patient is tachycardic to the 150s at rest. EKG shows sinus tach with no change from previous. Pt has white count and CXR shows atelectasis vs air space disease.   Patient is on 28% FiO2 at 5 L per minute on the trach which increases her oxygen saturation to 100%.  She has a chronic indwelling foley secondary to neurogenic bladder and has required IV Abx of Levoquin and Zosyn secondary to MDR  UTI.  UA from Foley is consistent with recurrence of UTI.   Originally pressure was in the 180s pressure dropped to 80's systolic with fluid responsive to bolus, elevating to 130's systolin Troponin is slightly  elevated at 0.10, there was a delay in receiving this result.  ABG obtained to see if she is retaining in requires a critical care bed. Sputum cultures obtained.   There is a delay in having the ABG results bridge over to the epic system her pH is 7.24 and CO2 is 74 this is significantly higher than her prior readings. She is appropriate for ventilator assistance with critical care bed.  The critical care fellow will come to evaluate her and determine the appropriate sent settings I will call respiratory to have a ventilator at the bedside.   Wynetta Emery, PA-C 10/20/11 0144

## 2011-10-19 NOTE — Progress Notes (Signed)
ANTIBIOTIC CONSULT NOTE - INITIAL  Pharmacy Consult for Vancomycin Indication: pneumonia  Allergies  Allergen Reactions  . Latex Other (See Comments)    Reaction unknown    Patient Measurements:    Body Weight: 100kg  Vital Signs: Temp: 98.7 F (37.1 C) (09/06 1831) Temp src: Oral (09/06 1831) BP: 105/62 mmHg (09/06 2130) Pulse Rate: 129  (09/06 2130) Intake/Output from previous day:   Intake/Output from this shift:    Labs:  [employer] 10/19/11 2134 10/19/11 2014  WBC -- 14.4*  HGB 11.2* 10.5*  PLT -- 202  LABCREA -- --  CREATININE 1.20* 1.13*     Microbiology: No results found for this or any previous visit (from the past 720 hour(s)).  Medical History: Past Medical History  Diagnosis Date  . Respiratory failure   . Dysphagia   . Contracture of hand joint   . Anemia   . Neurogenic bladder   . Anxiety   . Schizophrenia   . Quadriplegia     Medications:  See home med list Assessment: 46 year old woman with chronic trach to start on vancomycin, cefepime, and levofloxacin for pneumonia (HCAP).   Scr is 1.2.  Crcl ~ 70-80 mL/min.  Goal of Therapy:  Vancomycin trough level 15-20 mcg/ml  Plan:  Vancomycin 2g IV x 1 dose, then 1g IV q12.  Continue Cefepime 1g q8h and levofloxacin 750mg  IV daily.  Follow up renal function and culture results.  Mickeal Skinner 10/19/2011,10:01 PM

## 2011-10-19 NOTE — ED Notes (Signed)
Pt. Called out for c/o bleeding from the trach.  Pt. Also reported chest pain for 3 days.

## 2011-10-19 NOTE — ED Notes (Signed)
X-ray at bedside

## 2011-10-20 DIAGNOSIS — J189 Pneumonia, unspecified organism: Secondary | ICD-10-CM

## 2011-10-20 DIAGNOSIS — Z930 Tracheostomy status: Secondary | ICD-10-CM

## 2011-10-20 DIAGNOSIS — N390 Urinary tract infection, site not specified: Secondary | ICD-10-CM | POA: Diagnosis present

## 2011-10-20 DIAGNOSIS — J9600 Acute respiratory failure, unspecified whether with hypoxia or hypercapnia: Secondary | ICD-10-CM

## 2011-10-20 DIAGNOSIS — N319 Neuromuscular dysfunction of bladder, unspecified: Secondary | ICD-10-CM

## 2011-10-20 DIAGNOSIS — G8250 Quadriplegia, unspecified: Secondary | ICD-10-CM

## 2011-10-20 LAB — COMPREHENSIVE METABOLIC PANEL
AST: 12 U/L (ref 0–37)
BUN: 30 mg/dL — ABNORMAL HIGH (ref 6–23)
CO2: 31 mEq/L (ref 19–32)
Chloride: 102 mEq/L (ref 96–112)
Creatinine, Ser: 1.01 mg/dL (ref 0.50–1.10)
GFR calc non Af Amer: 66 mL/min — ABNORMAL LOW (ref >90)
Glucose, Bld: 105 mg/dL — ABNORMAL HIGH (ref 70–99)
Total Bilirubin: 0.2 mg/dL — ABNORMAL LOW (ref 0.3–1.2)

## 2011-10-20 LAB — POCT I-STAT 3, ART BLOOD GAS (G3+)
Bicarbonate: 33.2 mEq/L — ABNORMAL HIGH (ref 20.0–24.0)
O2 Saturation: 91 %
TCO2: 31 mmol/L (ref 0–100)
pCO2 arterial: 76.2 mmHg (ref 35.0–45.0)
pCO2 arterial: 39.6 mmHg (ref 35.0–45.0)
pH, Arterial: 7.477 — ABNORMAL HIGH (ref 7.350–7.450)
pO2, Arterial: 74 mmHg — ABNORMAL LOW (ref 80.0–100.0)
pO2, Arterial: 117 mmHg — ABNORMAL HIGH (ref 80.0–100.0)

## 2011-10-20 LAB — GLUCOSE, CAPILLARY: Glucose-Capillary: 77 mg/dL (ref 70–99)

## 2011-10-20 LAB — MAGNESIUM: Magnesium: 2 mg/dL (ref 1.5–2.5)

## 2011-10-20 LAB — CBC
HCT: 29.5 % — ABNORMAL LOW (ref 36.0–46.0)
Hemoglobin: 8.7 g/dL — ABNORMAL LOW (ref 12.0–15.0)
MCH: 23.3 pg — ABNORMAL LOW (ref 26.0–34.0)
MCHC: 29.5 g/dL — ABNORMAL LOW (ref 30.0–36.0)
MCV: 79.1 fL (ref 78.0–100.0)

## 2011-10-20 LAB — CK TOTAL AND CKMB (NOT AT ARMC)
CK, MB: 3.6 ng/mL (ref 0.3–4.0)
CK, MB: 4.1 ng/mL — ABNORMAL HIGH (ref 0.3–4.0)
Relative Index: INVALID (ref 0.0–2.5)
Total CK: 78 U/L (ref 7–177)
Total CK: 90 U/L (ref 7–177)

## 2011-10-20 LAB — STREP PNEUMONIAE URINARY ANTIGEN: Strep Pneumo Urinary Antigen: NEGATIVE

## 2011-10-20 LAB — PHOSPHORUS: Phosphorus: 3.2 mg/dL (ref 2.3–4.6)

## 2011-10-20 LAB — DIFFERENTIAL
Eosinophils Absolute: 0.2 10*3/uL (ref 0.0–0.7)
Eosinophils Relative: 2 % (ref 0–5)
Lymphs Abs: 2.2 10*3/uL (ref 0.7–4.0)

## 2011-10-20 MED ORDER — POLYVINYL ALCOHOL 1.4 % OP SOLN
1.0000 [drp] | Freq: Three times a day (TID) | OPHTHALMIC | Status: DC
  Administered 2011-10-20 – 2011-10-26 (×23): 1 [drp] via OPHTHALMIC
  Filled 2011-10-20 (×3): qty 15

## 2011-10-20 MED ORDER — SODIUM CHLORIDE 0.9 % IV SOLN
500.0000 mg | Freq: Three times a day (TID) | INTRAVENOUS | Status: DC
  Administered 2011-10-20 – 2011-10-24 (×14): 500 mg via INTRAVENOUS
  Filled 2011-10-20 (×16): qty 500

## 2011-10-20 MED ORDER — SODIUM CHLORIDE 0.9 % IV SOLN
500.0000 mg | Freq: Four times a day (QID) | INTRAVENOUS | Status: DC
  Filled 2011-10-20 (×2): qty 500

## 2011-10-20 MED ORDER — ALBUTEROL SULFATE (5 MG/ML) 0.5% IN NEBU
2.5000 mg | INHALATION_SOLUTION | Freq: Four times a day (QID) | RESPIRATORY_TRACT | Status: DC
  Administered 2011-10-20 – 2011-10-26 (×25): 2.5 mg via RESPIRATORY_TRACT
  Filled 2011-10-20 (×27): qty 0.5

## 2011-10-20 MED ORDER — POLYETHYLENE GLYCOL 3350 17 G PO PACK
17.0000 g | PACK | Freq: Two times a day (BID) | ORAL | Status: DC
  Administered 2011-10-20 – 2011-10-26 (×12): 17 g via ORAL
  Filled 2011-10-20 (×14): qty 1

## 2011-10-20 MED ORDER — SODIUM CHLORIDE 0.9 % IV SOLN: 250.0000 mL | INTRAVENOUS | Status: DC | PRN

## 2011-10-20 MED ORDER — LEVOFLOXACIN IN D5W 750 MG/150ML IV SOLN
750.0000 mg | INTRAVENOUS | Status: DC
  Administered 2011-10-20 – 2011-10-23 (×4): 750 mg via INTRAVENOUS
  Filled 2011-10-20 (×5): qty 150

## 2011-10-20 MED ORDER — METOPROLOL TARTRATE 12.5 MG HALF TABLET
12.5000 mg | ORAL_TABLET | Freq: Two times a day (BID) | ORAL | Status: DC
  Filled 2011-10-20 (×3): qty 1

## 2011-10-20 MED ORDER — TRAZODONE HCL 100 MG PO TABS
100.0000 mg | ORAL_TABLET | Freq: Every day | ORAL | Status: DC
  Administered 2011-10-20 – 2011-10-25 (×6): 100 mg via ORAL
  Filled 2011-10-20 (×7): qty 1

## 2011-10-20 MED ORDER — POLYETHYL GLYCOL-PROPYL GLYCOL 0.4-0.3 % OP SOLN: 1.0000 [drp] | Freq: Four times a day (QID) | OPHTHALMIC | Status: DC

## 2011-10-20 MED ORDER — MORPHINE SULFATE 2 MG/ML IJ SOLN
1.0000 mg | INTRAMUSCULAR | Status: DC | PRN
  Administered 2011-10-20 – 2011-10-25 (×11): 1 mg via INTRAVENOUS
  Filled 2011-10-20 (×12): qty 1

## 2011-10-20 MED ORDER — CALCIUM CARBONATE-VITAMIN D 500-200 MG-UNIT PO TABS
1.0000 | ORAL_TABLET | Freq: Every day | ORAL | Status: DC
  Administered 2011-10-20 – 2011-10-26 (×7): 1 via ORAL
  Filled 2011-10-20 (×8): qty 1

## 2011-10-20 MED ORDER — CERTA-VITE PO LIQD
5.0000 mL | Freq: Every day | ORAL | Status: DC
  Administered 2011-10-20 – 2011-10-26 (×7): 5 mL
  Filled 2011-10-20 (×7): qty 5

## 2011-10-20 MED ORDER — ACETAMINOPHEN 325 MG PO TABS
650.0000 mg | ORAL_TABLET | ORAL | Status: DC | PRN
  Administered 2011-10-25: 650 mg via ORAL
  Filled 2011-10-20: qty 2

## 2011-10-20 MED ORDER — FERROUS SULFATE 325 (65 FE) MG PO TABS
325.0000 mg | ORAL_TABLET | Freq: Every day | ORAL | Status: DC
  Administered 2011-10-20: 325 mg via ORAL
  Filled 2011-10-20 (×3): qty 1

## 2011-10-20 MED ORDER — SODIUM CHLORIDE 0.9 % IV SOLN
INTRAVENOUS | Status: AC
  Administered 2011-10-20: 09:00:00 via INTRAVENOUS

## 2011-10-20 MED ORDER — CHLORHEXIDINE GLUCONATE 0.12 % MT SOLN
15.0000 mL | Freq: Two times a day (BID) | OROMUCOSAL | Status: DC
  Administered 2011-10-20 – 2011-10-26 (×13): 15 mL via OROMUCOSAL
  Filled 2011-10-20 (×14): qty 15

## 2011-10-20 MED ORDER — IPRATROPIUM BROMIDE 0.02 % IN SOLN
500.0000 ug | Freq: Four times a day (QID) | RESPIRATORY_TRACT | Status: DC
  Administered 2011-10-20 – 2011-10-26 (×25): 500 ug via RESPIRATORY_TRACT
  Filled 2011-10-20 (×25): qty 2.5

## 2011-10-20 MED ORDER — PANTOPRAZOLE SODIUM 40 MG PO TBEC: 40.0000 mg | DELAYED_RELEASE_TABLET | Freq: Every day | ORAL | Status: DC

## 2011-10-20 MED ORDER — ALBUTEROL SULFATE (5 MG/ML) 0.5% IN NEBU: 2.5000 mg | INHALATION_SOLUTION | RESPIRATORY_TRACT | Status: DC | PRN

## 2011-10-20 MED ORDER — VANCOMYCIN HCL IN DEXTROSE 1-5 GM/200ML-% IV SOLN
1000.0000 mg | Freq: Every day | INTRAVENOUS | Status: DC
  Administered 2011-10-20 – 2011-10-21 (×2): 1000 mg via INTRAVENOUS
  Filled 2011-10-20 (×3): qty 200

## 2011-10-20 MED ORDER — ASPIRIN 81 MG PO CHEW: 324.0000 mg | CHEWABLE_TABLET | ORAL | Status: DC

## 2011-10-20 MED ORDER — VENLAFAXINE HCL 50 MG PO TABS
100.0000 mg | ORAL_TABLET | Freq: Two times a day (BID) | ORAL | Status: DC
  Administered 2011-10-20 (×2): 100 mg via ORAL
  Administered 2011-10-21: 50 mg via ORAL
  Administered 2011-10-21 – 2011-10-26 (×10): 100 mg via ORAL
  Filled 2011-10-20 (×15): qty 2

## 2011-10-20 MED ORDER — CLONAZEPAM 0.5 MG PO TABS
0.5000 mg | ORAL_TABLET | Freq: Two times a day (BID) | ORAL | Status: DC
  Administered 2011-10-20 – 2011-10-26 (×13): 0.5 mg via ORAL
  Filled 2011-10-20 (×13): qty 1

## 2011-10-20 MED ORDER — DOCUSATE SODIUM 50 MG/5ML PO LIQD
100.0000 mg | Freq: Two times a day (BID) | ORAL | Status: DC
  Administered 2011-10-20 – 2011-10-26 (×13): 100 mg via ORAL
  Filled 2011-10-20 (×14): qty 10

## 2011-10-20 MED ORDER — ENOXAPARIN SODIUM 40 MG/0.4ML ~~LOC~~ SOLN
40.0000 mg | Freq: Every day | SUBCUTANEOUS | Status: DC
  Administered 2011-10-20 – 2011-10-25 (×6): 40 mg via SUBCUTANEOUS
  Filled 2011-10-20 (×6): qty 0.4

## 2011-10-20 MED ORDER — ASPIRIN 300 MG RE SUPP: 300.0000 mg | RECTAL | Status: DC

## 2011-10-20 MED ORDER — SODIUM CHLORIDE 0.9 % IV BOLUS (SEPSIS)
500.0000 mL | Freq: Once | INTRAVENOUS | Status: AC
  Administered 2011-10-20: 500 mL via INTRAVENOUS

## 2011-10-20 MED ORDER — BIOTENE DRY MOUTH MT LIQD
15.0000 mL | Freq: Four times a day (QID) | OROMUCOSAL | Status: DC
  Administered 2011-10-20 – 2011-10-26 (×22): 15 mL via OROMUCOSAL

## 2011-10-20 MED ORDER — BIOTENE DRY MOUTH MT LIQD
15.0000 mL | Freq: Two times a day (BID) | OROMUCOSAL | Status: DC
Start: 2011-10-20 — End: 2011-10-20

## 2011-10-20 MED ORDER — PANTOPRAZOLE SODIUM 40 MG PO PACK
40.0000 mg | PACK | Freq: Every day | ORAL | Status: DC
  Administered 2011-10-20 – 2011-10-26 (×7): 40 mg
  Filled 2011-10-20 (×7): qty 20

## 2011-10-20 MED ORDER — OLANZAPINE 7.5 MG PO TABS
15.0000 mg | ORAL_TABLET | Freq: Every day | ORAL | Status: DC
  Administered 2011-10-20 – 2011-10-25 (×6): 15 mg via ORAL
  Filled 2011-10-20 (×7): qty 2

## 2011-10-20 MED ORDER — ONDANSETRON HCL 4 MG PO TABS: 4.0000 mg | ORAL_TABLET | Freq: Four times a day (QID) | ORAL | Status: DC | PRN

## 2011-10-20 MED ORDER — DOCUSATE SODIUM 100 MG PO CAPS
100.0000 mg | ORAL_CAPSULE | Freq: Two times a day (BID) | ORAL | Status: DC
  Filled 2011-10-20 (×2): qty 1

## 2011-10-20 NOTE — ED Notes (Signed)
ENT to come change trach.

## 2011-10-20 NOTE — Progress Notes (Signed)
ANTIBIOTIC CONSULT NOTE   Pharmacy Consult for Vancomycin/Primaxin Indication: pneumonia  Allergies  Allergen Reactions  . Latex Other (See Comments)    Reaction unknown    Patient Measurements:    Body Weight: 100kg  Labs:  Basename 10/19/11 2134 10/19/11 2014  WBC -- 14.4*  HGB 11.2* 10.5*  PLT -- 202  LABCREA -- --  CREATININE 1.20* 1.13*     Microbiology: No results found for this or any previous visit (from the past 720 hour(s)).  Medical History: Past Medical History  Diagnosis Date  . Respiratory failure   . Dysphagia   . Contracture of hand joint   . Anemia   . Neurogenic bladder   . Anxiety   . Schizophrenia   . Quadriplegia    Assessment: 46 yo female with PNA for empiric antibiotics.  Given h/o quadraplegia SCr likely overestimates GFR so will adjust vancomycin dose accordingly.  Primaxin to start for h/o MDR organisms in urine and trach.   Goal of Therapy:  Vancomycin trough level 15-20 mcg/ml  Plan:  Change vancomycin 1 g IV q24h Primaxin 500 mg IV q8h  Eddie Candle 10/20/2011,3:27 AM

## 2011-10-20 NOTE — ED Notes (Signed)
Pulmonology fellow at bedside; RT at bedside; pt given water and face washed.

## 2011-10-20 NOTE — Progress Notes (Signed)
Dr Baird Lyons ,[facility] Pulmonology, paged re troponin value of 0.85 .
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
## 2011-10-20 NOTE — ED Provider Notes (Signed)
Medical screening examination/treatment/procedure(s) were conducted as a shared visit with non-physician practitioner(s) and myself.  I personally evaluated the patient during the encounter  Pt with probable PNA on cxr.   Treated for HCAP.  Decrease in BP while in the ED although responded to fluid bolus..  History of numerous medical problems, complicated history.  Pt admitted to ICU for further treatment.  Celene Kras, MD 10/20/11 [phone]

## 2011-10-20 NOTE — Progress Notes (Signed)
INITIAL ADULT NUTRITION ASSESSMENT Date: 10/20/2011   Time: 9:45 AM  Reason for Assessment: MD consult for TF recommendations  INTERVENTION:  If unable to extubate patient in the next 24 hours, recommend start TF via PEG with Jevity 1.2 at 30 ml/h with Prostat 30 ml QID to provide 1264 kcals (63% of estimated needs), 100 grams protein (100% of estimated needs), and 583 ml free water daily.   DOCUMENTATION CODES Per approved criteria  -Morbid Obesity    ASSESSMENT: Female 46 y.o.  Dx: Healthcare-associated pneumonia  Hx:  Past Medical History  Diagnosis Date  . Respiratory failure   . Dysphagia   . Contracture of hand joint   . Anemia   . Neurogenic bladder   . Anxiety   . Schizophrenia   . Quadriplegia    Past Surgical History  Procedure Date  . Tracheostomy   . Gastrostomy w/ feeding tube     Related Meds:  Scheduled Meds:   . albuterol  2.5 mg Nebulization Q6H  . antiseptic oral rinse  15 mL Mouth Rinse QID  . aspirin  324 mg Oral Once  . calcium-vitamin D  1 tablet Oral Q breakfast  . chlorhexidine  15 mL Mouth Rinse BID  . clonazePAM  0.5 mg Oral BID  . docusate sodium  100 mg Oral BID  . enoxaparin  40 mg Subcutaneous Daily  . ferrous sulfate  325 mg Oral Q breakfast  . imipenem-cilastatin  500 mg Intravenous Q8H  . ipratropium  500 mcg Nebulization Q6H WA  . levofloxacin (LEVAQUIN) IV  750 mg Intravenous Q24H  . morphine  4 mg Intravenous Once  . multivitamin with minerals  5 mL Per Tube Daily  . OLANZapine  15 mg Oral QHS  . ondansetron (ZOFRAN) IV  4 mg Intravenous Once  . pantoprazole  40 mg Oral Q1200  . polyethylene glycol  17 g Oral BID  . polyvinyl alcohol  1 drop Both Eyes TID AC & HS  . sodium chloride  500 mL Intravenous Once  . traZODone  100 mg Oral QHS  . vancomycin  2,000 mg Intravenous Once  . vancomycin  1,000 mg Intravenous QHS  . venlafaxine  100 mg Oral BID WC  . DISCONTD: antiseptic oral rinse  15 mL Mouth Rinse BID  .  DISCONTD: aspirin  324 mg Oral NOW  . DISCONTD: aspirin  300 mg Rectal NOW  . DISCONTD: ceFEPime (MAXIPIME) IV  1 g Intravenous Q8H  . DISCONTD: imipenem-cilastatin  500 mg Intravenous Q6H  . DISCONTD: levofloxacin (LEVAQUIN) IV  750 mg Intravenous Q24H  . DISCONTD: metoprolol tartrate  12.5 mg Oral BID  . DISCONTD: Polyethyl Glycol-Propyl Glycol  1 drop Both Eyes QID  . DISCONTD: vancomycin  1,000 mg Intravenous Q12H   Continuous Infusions:   . sodium chloride 20 mL/hr at 10/20/11 0830  . DISCONTD: sodium chloride 1,000 mL (10/19/11 2308)   PRN Meds:.sodium chloride, acetaminophen, albuterol, morphine injection, ondansetron, DISCONTD: nitroGLYCERIN  Ht:   5\' 2"  (1.575 m)  Wt: 242 lb 11.6 oz (110.1 kg)  Ideal Wt: 50 kg % Ideal Wt: 220%  Usual Wt:  Wt Readings from Last 10 Encounters:  10/20/11 242 lb 11.6 oz (110.1 kg)  08/28/11 220 lb 7.4 oz (100 kg)  07/25/11 234 lb 9.1 oz (106.4 kg)   % Usual Wt: 110%  BMI=44.4   Food/Nutrition Related Hx: Has a PEG, but was not using it PTA.  Per brother, she was eating regular foods/liquids and  had a good appetite and good intake PTA.  Weight was stable.  Labs:  CMP     Component Value Date/Time   NA 140 10/20/2011 0412   K 4.2 10/20/2011 0412   CL 102 10/20/2011 0412   CO2 31 10/20/2011 0412   GLUCOSE 105* 10/20/2011 0412   BUN 30* 10/20/2011 0412   CREATININE 1.01 10/20/2011 0412   CALCIUM 8.8 10/20/2011 0412   PROT 6.8 10/20/2011 0412   ALBUMIN 2.6* 10/20/2011 0412   AST 12 10/20/2011 0412   ALT 10 10/20/2011 0412   ALKPHOS 64 10/20/2011 0412   BILITOT 0.2* 10/20/2011 0412   GFRNONAA 66* 10/20/2011 0412   GFRAA 76* 10/20/2011 0412      Intake/Output Summary (Last 24 hours) at 10/20/11 1001 Last data filed at 10/20/11 0500  Gross per 24 hour  Intake 1122.08 ml  Output    822 ml  Net 300.08 ml     Diet Order: NPO   IVF:    sodium chloride Last Rate: 20 mL/hr at 10/20/11 0830  DISCONTD: sodium chloride Last Rate: 1,000 mL (10/19/11  2308)    Estimated Nutritional Needs:   Kcal: 2005 Protein: 95-110 grams Fluid: 2-2.2 liters  Patient is currently intubated on ventilator support.  MV: 6.2 Temp:Temp (24hrs), Avg:99.4 F (37.4 C), Min:98.7 F (37.1 C), Max:100.9 F (38.3 C)  No nutrition problems identified on admission, except morbid obesity with BMI=44.4.    NUTRITION DIAGNOSIS: -Inadequate oral intake (NI-2.1).  Status: Ongoing  RELATED TO: inability to eat  AS EVIDENCED BY: NPO status  MONITORING/EVALUATION(Goals):  Goal:  Enteral nutrition to provide 60-70% of estimated calorie needs (22-25 kcals/kg ideal body weight) and >/= 90% of estimated protein needs, based on ASPEN guidelines for permissive underfeeding in critically ill obese individuals.  Monitor:  TF initiation/tolerance/adequacy, weight trend, labs, I/O  EDUCATION NEEDS: -Education not appropriate at this time   Joaquin Courts, RD, CNSC, LDN Pager# [phone] After Hours Pager# [phone] 10/20/2011, 9:45 AM
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
## 2011-10-20 NOTE — Progress Notes (Signed)
Notified CCM of large fluctuations in BP from systolic of 70's to low 200's and that patient is having tremors. Order received to obtain an a-line. Respiratory therapy notified.

## 2011-10-21 ENCOUNTER — Inpatient Hospital Stay (HOSPITAL_COMMUNITY): Payer: Medicare Other

## 2011-10-21 LAB — LEGIONELLA ANTIGEN, URINE

## 2011-10-21 LAB — BASIC METABOLIC PANEL
BUN: 24 mg/dL — ABNORMAL HIGH (ref 6–23)
Chloride: 105 mEq/L (ref 96–112)
Creatinine, Ser: 0.97 mg/dL (ref 0.50–1.10)
Glucose, Bld: 78 mg/dL (ref 70–99)
Potassium: 4 mEq/L (ref 3.5–5.1)

## 2011-10-21 LAB — CBC
HCT: 28 % — ABNORMAL LOW (ref 36.0–46.0)
Hemoglobin: 8.2 g/dL — ABNORMAL LOW (ref 12.0–15.0)
MCH: 23.2 pg — ABNORMAL LOW (ref 26.0–34.0)
MCHC: 29.3 g/dL — ABNORMAL LOW (ref 30.0–36.0)
MCV: 79.1 fL (ref 78.0–100.0)
RDW: 13.6 % (ref 11.5–15.5)

## 2011-10-21 MED ORDER — FUROSEMIDE 10 MG/ML IJ SOLN
40.0000 mg | Freq: Two times a day (BID) | INTRAMUSCULAR | Status: AC
  Administered 2011-10-21 (×2): 40 mg via INTRAVENOUS
  Filled 2011-10-21 (×2): qty 4

## 2011-10-21 MED ORDER — MUPIROCIN 2 % EX OINT
1.0000 "application " | TOPICAL_OINTMENT | Freq: Two times a day (BID) | CUTANEOUS | Status: AC
  Administered 2011-10-21 – 2011-10-25 (×10): 1 via NASAL
  Filled 2011-10-21: qty 22

## 2011-10-21 MED ORDER — FERROUS SULFATE 220 (44 FE) MG/5ML PO ELIX
220.0000 mg | ORAL_SOLUTION | Freq: Every day | ORAL | Status: DC
  Administered 2011-10-21 – 2011-10-26 (×6): 220 mg
  Filled 2011-10-21 (×7): qty 5

## 2011-10-21 MED ORDER — NITROGLYCERIN IN D5W 200-5 MCG/ML-% IV SOLN
2.0000 ug/min | INTRAVENOUS | Status: DC
  Administered 2011-10-21: 5 ug/min via INTRAVENOUS
  Filled 2011-10-21: qty 250

## 2011-10-21 MED ORDER — POTASSIUM CHLORIDE 20 MEQ/15ML (10%) PO LIQD
40.0000 meq | Freq: Once | ORAL | Status: AC
  Administered 2011-10-21: 40 meq
  Filled 2011-10-21: qty 30

## 2011-10-21 MED ORDER — CHLORHEXIDINE GLUCONATE CLOTH 2 % EX PADS
6.0000 | MEDICATED_PAD | Freq: Every day | CUTANEOUS | Status: AC
  Administered 2011-10-21 – 2011-10-25 (×5): 6 via TOPICAL
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
## 2011-10-22 ENCOUNTER — Inpatient Hospital Stay (HOSPITAL_COMMUNITY): Payer: Medicare Other

## 2011-10-22 DIAGNOSIS — F209 Schizophrenia, unspecified: Secondary | ICD-10-CM

## 2011-10-22 DIAGNOSIS — N390 Urinary tract infection, site not specified: Secondary | ICD-10-CM

## 2011-10-22 LAB — GLUCOSE, CAPILLARY: Glucose-Capillary: 81 mg/dL (ref 70–99)

## 2011-10-22 LAB — BASIC METABOLIC PANEL
BUN: 20 mg/dL (ref 6–23)
CO2: 31 mEq/L (ref 19–32)
Chloride: 98 mEq/L (ref 96–112)
GFR calc Af Amer: 75 mL/min — ABNORMAL LOW (ref >90)
Glucose, Bld: 82 mg/dL (ref 70–99)
Potassium: 3.7 mEq/L (ref 3.5–5.1)

## 2011-10-22 LAB — CBC
HCT: 30.9 % — ABNORMAL LOW (ref 36.0–46.0)
Hemoglobin: 9.1 g/dL — ABNORMAL LOW (ref 12.0–15.0)
RBC: 3.96 MIL/uL (ref 3.87–5.11)
RDW: 13.4 % (ref 11.5–15.5)
WBC: 7.1 10*3/uL (ref 4.0–10.5)

## 2011-10-22 LAB — CULTURE, RESPIRATORY W GRAM STAIN

## 2011-10-22 MED ORDER — COLLAGENASE 250 UNIT/GM EX OINT
TOPICAL_OINTMENT | Freq: Every day | CUTANEOUS | Status: DC
  Administered 2011-10-22 – 2011-10-26 (×5): via TOPICAL
  Filled 2011-10-22: qty 30

## 2011-10-22 MED ORDER — POTASSIUM CHLORIDE 20 MEQ/15ML (10%) PO LIQD
40.0000 meq | Freq: Once | ORAL | Status: AC
  Administered 2011-10-22: 40 meq
  Filled 2011-10-22: qty 30

## 2011-10-22 MED ORDER — FUROSEMIDE 10 MG/ML IJ SOLN
40.0000 mg | Freq: Every day | INTRAMUSCULAR | Status: DC
  Administered 2011-10-22: 40 mg via INTRAVENOUS
  Filled 2011-10-22 (×2): qty 4

## 2011-10-22 MED ORDER — METOPROLOL TARTRATE 12.5 MG HALF TABLET
12.5000 mg | ORAL_TABLET | Freq: Two times a day (BID) | ORAL | Status: DC
  Administered 2011-10-22 – 2011-10-25 (×7): 12.5 mg via ORAL
  Filled 2011-10-22 (×8): qty 1

## 2011-10-22 NOTE — Clinical Social Work Psychosocial (Signed)
     Clinical Social Work Department BRIEF PSYCHOSOCIAL ASSESSMENT 10/22/2011  Patient:  [name], [name]     Account Number:  [id number]     Admit date:  10/19/2011  Clinical Social Worker:  Hulan Fray  Date/Time:  10/22/2011 03:49 PM  Referred by:  RN  Date Referred:  10/22/2011 Referred for  Other - See comment   Other Referral:   admit from facility   Interview type:  Family Other interview type:   Adela Lank- Sister  Marylene Land- Admissions coordinator at [facility]    PSYCHOSOCIAL DATA Living Status:  FACILITY Admitted from facility:  [facility] Level of care:  Skilled Nursing Facility Primary support name:  Adela Lank Primary support relationship to patient:  SIBLING Degree of support available:   supportive    CURRENT CONCERNS Current Concerns  None Noted   Other Concerns:    SOCIAL WORK ASSESSMENT / PLAN Clinical Social Worker received referral for patient being admitted from [facility] SNF. Patient is currently intubated. CSW spoke with patient's sister, and she stated that patient is a long term resident at [facility]. Adela Lank reported that the patient has been through a similar situation previously. CSW provided sister with emotional support and she ireported being  hopeful for patient to recover, like she did previously. CSW spoke with Marylene Land at [facility] and she reported that patient is a long term resident and would "love to have her back."   Assessment/plan status:  Psychosocial Support/Ongoing Assessment of Needs Other assessment/ plan:   Per CM, patient does have LTAC benefits if needed.   Information/referral to community resources:   Patient is from facility    PATIENTS/FAMILYS RESPONSE TO PLAN OF CARE: Patient's sister is agreeable for patient to return back to [facility] if able.

## 2011-10-22 NOTE — Progress Notes (Signed)
Nutrition Follow-up  Intervention:   1. Recommend initiation of EN via PEG, Jevity 1.2 at 30 ml/hr and 30 ml Pro-stat QID to provide 1264 kcal (69%) and 100 gm protein (100%) and 583 ml free water.  2. Pt will require additional free water if no IV fluids.  3. RD will continue to follow    Assessment:   Pt remains intubated. No TF has been initiated.     MV: 6 Temp:Temp (24hrs), Avg:99.5 F (37.5 C), Min:99.1 F (37.3 C), Max:100.4 F (38 C)  Propofol: none   Diet Order:  NPO TF/supplements: none  Meds: Scheduled Meds:   . albuterol  2.5 mg Nebulization Q6H  . antiseptic oral rinse  15 mL Mouth Rinse QID  . aspirin  324 mg Oral Once  . calcium-vitamin D  1 tablet Oral Q breakfast  . chlorhexidine  15 mL Mouth Rinse BID  . Chlorhexidine Gluconate Cloth  6 each Topical Q0600  . clonazePAM  0.5 mg Oral BID  . collagenase   Topical Daily  . docusate  100 mg Oral BID  . enoxaparin  40 mg Subcutaneous Daily  . ferrous sulfate  220 mg Per Tube Q breakfast  . furosemide  40 mg Intravenous BID  . furosemide  40 mg Intravenous Daily  . imipenem-cilastatin  500 mg Intravenous Q8H  . ipratropium  500 mcg Nebulization Q6H WA  . levofloxacin (LEVAQUIN) IV  750 mg Intravenous Q24H  . metoprolol tartrate  12.5 mg Oral BID  . multivitamin with minerals  5 mL Per Tube Daily  . mupirocin ointment  1 application Nasal BID  . OLANZapine  15 mg Oral QHS  . pantoprazole sodium  40 mg Per Tube Q1200  . polyethylene glycol  17 g Oral BID  . polyvinyl alcohol  1 drop Both Eyes TID AC & HS  . potassium chloride  40 mEq Per Tube Once  . traZODone  100 mg Oral QHS  . venlafaxine  100 mg Oral BID WC  . DISCONTD: vancomycin  1,000 mg Intravenous QHS   Continuous Infusions:   . DISCONTD: nitroGLYCERIN 5 mcg/min (10/21/11 1919)   PRN Meds:.sodium chloride, acetaminophen, albuterol, morphine injection, ondansetron  Labs:  CMP     Component Value Date/Time   NA 140 10/22/2011 0400   K 3.7  10/22/2011 0400   CL 98 10/22/2011 0400   CO2 31 10/22/2011 0400   GLUCOSE 82 10/22/2011 0400   BUN 20 10/22/2011 0400   CREATININE 1.02 10/22/2011 0400   CALCIUM 9.5 10/22/2011 0400   PROT 6.8 10/20/2011 0412   ALBUMIN 2.6* 10/20/2011 0412   AST 12 10/20/2011 0412   ALT 10 10/20/2011 0412   ALKPHOS 64 10/20/2011 0412   BILITOT 0.2* 10/20/2011 0412   GFRNONAA 65* 10/22/2011 0400   GFRAA 75* 10/22/2011 0400     Intake/Output Summary (Last 24 hours) at 10/22/11 1513 Last data filed at 10/22/11 1400  Gross per 24 hour  Intake 1616.19 ml  Output   4325 ml  Net -2708.81 ml    Weight Status:  233 lbs, trending down with negative fluid balance  Re-estimated needs:  1822 kcal (underfeeding goal: 1093-1275 kcal) and 95-110 gm protein daily   Nutrition Dx:  Inadequate oral intake R/T inability to eat AEB NPO status, ongoing   Goal:  Enteral nutrition to provide 60-70% of estimated calorie needs (22-25 kcals/kg ideal body weight) and >/= 90% of estimated protein needs, based on ASPEN guidelines for permissive underfeeding in critically  ill obese individuals. --unmet  Monitor:  EN initiation, vent status, weight    Clarene Duke RD, LDN Pager [phone] After Hours pager [phone]
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
## 2011-10-22 NOTE — Care Management Note (Signed)
    Page 1 of 1   10/22/2011     12:14:52 PM   CARE MANAGEMENT NOTE 10/22/2011  Patient:  GLENDORA, CLOUATRE   Account Number:  [id number]  Date Initiated:  10/22/2011  Documentation initiated by:  Junius Creamer  Subjective/Objective Assessment:   adm w pneumonia, on vent     Action/Plan:   from [facility]   Anticipated DC Date:     Anticipated DC Plan:    In-house referral  Clinical Social Worker      DC Planning Services  CM consult      Choice offered to / List presented to:             Status of service:   Medicare Important Message given?   (If response is "NO", the following Medicare IM given date fields will be blank) Date Medicare IM given:   Date Additional Medicare IM given:    Discharge Disposition:    Per UR Regulation:  Reviewed for med. necessity/level of care/duration of stay  If discussed at Long Length of Stay Meetings, dates discussed:    Comments:  9/9 12:14p debbie Demerius Podolak rn,bsn [phone]
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
## 2011-10-23 ENCOUNTER — Inpatient Hospital Stay (HOSPITAL_COMMUNITY): Payer: Medicare Other

## 2011-10-23 DIAGNOSIS — D638 Anemia in other chronic diseases classified elsewhere: Secondary | ICD-10-CM

## 2011-10-23 LAB — CBC
HCT: 32.1 % — ABNORMAL LOW (ref 36.0–46.0)
Platelets: 195 10*3/uL (ref 150–400)
RBC: 4.09 MIL/uL (ref 3.87–5.11)
RDW: 13.4 % (ref 11.5–15.5)
WBC: 8.3 10*3/uL (ref 4.0–10.5)

## 2011-10-23 LAB — PHOSPHORUS: Phosphorus: 4.1 mg/dL (ref 2.3–4.6)

## 2011-10-23 LAB — BASIC METABOLIC PANEL
BUN: 22 mg/dL (ref 6–23)
Chloride: 99 mEq/L (ref 96–112)
GFR calc Af Amer: 67 mL/min — ABNORMAL LOW (ref >90)
Potassium: 4 mEq/L (ref 3.5–5.1)

## 2011-10-23 LAB — OCCULT BLOOD X 1 CARD TO LAB, STOOL: Fecal Occult Bld: NEGATIVE

## 2011-10-23 MED ORDER — PRO-STAT SUGAR FREE PO LIQD
30.0000 mL | Freq: Four times a day (QID) | ORAL | Status: DC
  Administered 2011-10-23 – 2011-10-26 (×12): 30 mL via ORAL
  Filled 2011-10-23 (×16): qty 30

## 2011-10-23 MED ORDER — FUROSEMIDE 10 MG/ML IJ SOLN
20.0000 mg | Freq: Every day | INTRAMUSCULAR | Status: DC
  Administered 2011-10-23 – 2011-10-24 (×2): 20 mg via INTRAVENOUS
  Filled 2011-10-23: qty 2

## 2011-10-23 MED ORDER — JEVITY 1.2 CAL PO LIQD
1000.0000 mL | ORAL | Status: DC
  Administered 2011-10-23: 1000 mL
  Filled 2011-10-23 (×5): qty 1000

## 2011-10-23 NOTE — Progress Notes (Signed)
Rt put patient on a trach collar trial, per MD order.  Patient tolerating well at this time.

## 2011-10-23 NOTE — Progress Notes (Addendum)
ANTIBIOTIC CONSULT NOTE   Pharmacy Consult for Levaquin/Primaxin Indication: pneumonia  Allergies  Allergen Reactions  . Latex Other (See Comments)    Reaction unknown    Patient Measurements: Height: 4' 8.3" (143 cm) Weight: 233 lb 7.5 oz (105.9 kg) IBW/kg (Calculated) : 36.99   Labs:  Basename 10/23/11 0500 10/22/11 0400 10/21/11 0345  WBC 8.3 7.1 7.0  HGB 9.4* 9.1* 8.2*  PLT 195 173 166  LABCREA -- -- --  CREATININE 1.12* 1.02 0.97     Microbiology: Recent Results (from the past 720 hour(s))  MRSA PCR SCREENING     Status: Abnormal   Collection Time   10/20/11  3:14 AM      Component Value Range Status Comment   MRSA by PCR POSITIVE (*) NEGATIVE Final   URINE CULTURE     Status: Normal (Preliminary result)   Collection Time   10/20/11  3:25 AM      Component Value Range Status Comment   Specimen Description URINE, CATHETERIZED   Final    Special Requests NONE   Final    Culture  Setup Time 10/20/2011 11:06   Final    Colony Count >=100,000 COLONIES/ML   Final    Culture GRAM NEGATIVE RODS   Final    Report Status PENDING   Incomplete   CULTURE, BLOOD (ROUTINE X 2)     Status: Normal (Preliminary result)   Collection Time   10/20/11  3:50 AM      Component Value Range Status Comment   Specimen Description BLOOD LEFT ARM   Final    Special Requests BOTTLES DRAWN AEROBIC ONLY 10CC   Final    Culture  Setup Time 10/20/2011 10:12   Final    Culture     Final    Value:        BLOOD CULTURE RECEIVED NO GROWTH TO DATE CULTURE WILL BE HELD FOR 5 DAYS BEFORE ISSUING A FINAL NEGATIVE REPORT   Report Status PENDING   Incomplete   CULTURE, RESPIRATORY     Status: Normal   Collection Time   10/20/11  4:07 AM      Component Value Range Status Comment   Specimen Description SPUTUM   Final    Special Requests NONE   Final    Gram Stain     Final    Value: FEW WBC PRESENT,BOTH PMN AND MONONUCLEAR     NO SQUAMOUS EPITHELIAL CELLS SEEN     FEW GRAM POSITIVE COCCI IN PAIRS   IN CLUSTERS   Culture NORMAL OROPHARYNGEAL FLORA   Final    Report Status 10/22/2011 FINAL   Final   CULTURE, BLOOD (ROUTINE X 2)     Status: Normal (Preliminary result)   Collection Time   10/20/11  4:10 AM      Component Value Range Status Comment   Specimen Description BLOOD LEFT HAND   Final    Special Requests BOTTLES DRAWN AEROBIC ONLY 5CC   Final    Culture  Setup Time 10/20/2011 10:12   Final    Culture     Final    Value:        BLOOD CULTURE RECEIVED NO GROWTH TO DATE CULTURE WILL BE HELD FOR 5 DAYS BEFORE ISSUING A FINAL NEGATIVE REPORT   Report Status PENDING   Incomplete   CULTURE, RESPIRATORY     Status: Normal (Preliminary result)   Collection Time   10/20/11  8:27 AM      Component Value  Range Status Comment   Specimen Description TRACHEAL ASPIRATE   Final    Special Requests NONE   Final    Gram Stain PENDING   Incomplete    Culture Non-Pathogenic Oropharyngeal-type Flora Isolated.   Final    Report Status PENDING   Incomplete   CULTURE, RESPIRATORY     Status: Normal (Preliminary result)   Collection Time   10/20/11  9:30 PM      Component Value Range Status Comment   Specimen Description TRACHEAL ASPIRATE   Final    Special Requests NONE   Final    Gram Stain     Final    Value: FEW WBC PRESENT, PREDOMINANTLY PMN     NO SQUAMOUS EPITHELIAL CELLS SEEN     RARE GRAM NEGATIVE RODS   Culture FEW GRAM NEGATIVE RODS   Final    Report Status PENDING   Incomplete     Medical History: Past Medical History  Diagnosis Date  . Respiratory failure   . Dysphagia   . Contracture of hand joint   . Anemia   . Neurogenic bladder   . Anxiety   . Schizophrenia   . Quadriplegia    Assessment: 46 yo quadraplegic female admitted with Healthcare-associated PNA. Hx of multiple admissions with PNA and MDR UTIs. Currently on Levaquin and Primaxin for GNR that resulted in the UCx and Resp Cx. Pt is currently afebrile, WBC wnl. Total abx days = 5.   Primaxin 9/7> Levaquin  9/6> Vancomycin 9/6>9/9 Cefepime 9/6>9/7  Plan:  1. Continue Levaquin 750mg  IV q24h  2. Continue Primaxin 500mg  IV q8h 3. Trend WBC, temp  [employer], Pharm.D. Clinical Pharmacist   Pager: [phone] 10/23/2011 7:54 AM
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
## 2011-10-24 ENCOUNTER — Inpatient Hospital Stay (HOSPITAL_COMMUNITY): Payer: Medicare Other

## 2011-10-24 LAB — CBC
Hemoglobin: 10 g/dL — ABNORMAL LOW (ref 12.0–15.0)
MCHC: 29.5 g/dL — ABNORMAL LOW (ref 30.0–36.0)
RBC: 4.32 MIL/uL (ref 3.87–5.11)
WBC: 7.7 10*3/uL (ref 4.0–10.5)

## 2011-10-24 LAB — BASIC METABOLIC PANEL
Chloride: 100 mEq/L (ref 96–112)
GFR calc non Af Amer: 59 mL/min — ABNORMAL LOW (ref >90)
Glucose, Bld: 121 mg/dL — ABNORMAL HIGH (ref 70–99)
Potassium: 3.7 mEq/L (ref 3.5–5.1)
Sodium: 144 mEq/L (ref 135–145)

## 2011-10-24 LAB — URINE CULTURE

## 2011-10-24 LAB — CULTURE, RESPIRATORY W GRAM STAIN

## 2011-10-24 MED ORDER — POTASSIUM CHLORIDE 20 MEQ/15ML (10%) PO LIQD
ORAL | Status: AC
  Filled 2011-10-24: qty 15

## 2011-10-24 MED ORDER — TOBRAMYCIN SULFATE 80 MG/2ML IJ SOLN
7.0000 mg/kg | INTRAMUSCULAR | Status: DC
  Administered 2011-10-24: 484 mg via INTRAVENOUS
  Filled 2011-10-24 (×2): qty 12.1

## 2011-10-24 MED ORDER — DEXTROSE 5 % IV SOLN
2.0000 g | Freq: Three times a day (TID) | INTRAVENOUS | Status: DC
  Administered 2011-10-24 – 2011-10-26 (×6): 2 g via INTRAVENOUS
  Filled 2011-10-24 (×11): qty 2

## 2011-10-24 MED ORDER — POTASSIUM CHLORIDE 20 MEQ/15ML (10%) PO LIQD
40.0000 meq | Freq: Once | ORAL | Status: AC
  Administered 2011-10-24: 40 meq
  Filled 2011-10-24: qty 30

## 2011-10-24 MED ORDER — INFLUENZA VIRUS VACC SPLIT PF IM SUSP
0.5000 mL | INTRAMUSCULAR | Status: AC
  Filled 2011-10-24: qty 0.5
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
## 2011-10-24 NOTE — Progress Notes (Signed)
Nutrition Follow-up  Intervention:   1. Continue with EN until pt able to pass swallow evaluation 2. Recommend increase TF, Jevity 1.2 @ goal rate of 55 ml/hr, d/c pro-stat. This would provide 1584 kcal, 73 gm protein, and 1065 ml free water.  3. RD will continue to follow    Assessment:   Pt now on trach collar. Pt continues to receive TF via PEG.  Continues to diurese. 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   Jevity 1.2 is infusing @ 30 ml/hr. 30 ml Prostat via tube QID. Tube feeding regimen currently providing 1264 kcal, 100 grams protein, and 583 ml H2O.   Residuals: none per documentation   Last bm: 9/11    Diet Order:  NPO  Meds: Scheduled Meds:   . albuterol  2.5 mg Nebulization Q6H  . antiseptic oral rinse  15 mL Mouth Rinse QID  . aspirin  324 mg Oral Once  . calcium-vitamin D  1 tablet Oral Q breakfast  . chlorhexidine  15 mL Mouth Rinse BID  . Chlorhexidine Gluconate Cloth  6 each Topical Q0600  . clonazePAM  0.5 mg Oral BID  . collagenase   Topical Daily  . docusate  100 mg Oral BID  . enoxaparin  40 mg Subcutaneous Daily  . feeding supplement  30 mL Oral QID  . ferrous sulfate  220 mg Per Tube Q breakfast  . furosemide  20 mg Intravenous Daily  . imipenem-cilastatin  500 mg Intravenous Q8H  . ipratropium  500 mcg Nebulization Q6H WA  . levofloxacin (LEVAQUIN) IV  750 mg Intravenous Q24H  . metoprolol tartrate  12.5 mg Oral BID  . multivitamin with minerals  5 mL Per Tube Daily  . mupirocin ointment  1 application Nasal BID  . OLANZapine  15 mg Oral QHS  . pantoprazole sodium  40 mg Per Tube Q1200  . polyethylene glycol  17 g Oral BID  . polyvinyl alcohol  1 drop Both Eyes TID AC & HS  . traZODone  100 mg Oral QHS  . venlafaxine  100 mg Oral BID WC   Continuous Infusions:   . feeding supplement (JEVITY 1.2 CAL) 1,000 mL (10/23/11 1422)   PRN Meds:.sodium chloride, acetaminophen, albuterol, morphine injection,  ondansetron  Labs:  CMP     Component Value Date/Time   NA 144 10/24/2011 0500   K 3.7 10/24/2011 0500   CL 100 10/24/2011 0500   CO2 32 10/24/2011 0500   GLUCOSE 121* 10/24/2011 0500   BUN 30* 10/24/2011 0500   CREATININE 1.11* 10/24/2011 0500   CALCIUM 10.1 10/24/2011 0500   PROT 6.8 10/20/2011 0412   ALBUMIN 2.6* 10/20/2011 0412   AST 12 10/20/2011 0412   ALT 10 10/20/2011 0412   ALKPHOS 64 10/20/2011 0412   BILITOT 0.2* 10/20/2011 0412   GFRNONAA 59* 10/24/2011 0500   GFRAA 68* 10/24/2011 0500     Intake/Output Summary (Last 24 hours) at 10/24/11 0945 Last data filed at 10/24/11 0800  Gross per 24 hour  Intake   1490 ml  Output   1600 ml  Net   -110 ml    Weight Status:  230 lbs, trending down with negative fluid balance  Re-estimated needs: 1600-1800 kcal, 70-88 gm protein daily, fluids per team with ongoing diuretics.   Nutrition Dx:  Inadequate oral intake now r/t inability to eat AEB trach collar.    Goal:  Enteral nutrition to provide 60-70% of estimated calorie needs (  22-25 kcals/kg ideal body weight) and >/= 90% of estimated protein needs, based on ASPEN guidelines for permissive underfeeding in critically ill obese individuals. -no longer applicable New Goal: Meet 90-100% estimated nutrition needs with EN   Monitor:  Respiratory status, weight, labs, EN   Clarene Duke RD, LDN Pager [phone] After Hours pager [phone]

## 2011-10-24 NOTE — Progress Notes (Signed)
ANTIBIOTIC CONSULT NOTE - INITIAL  Pharmacy Consult for tobramycin Indication: pneumonia  Allergies  Allergen Reactions  . Latex Other (See Comments)    Reaction unknown    Patient Measurements: Height: 4' 8.3" (143 cm) Weight: 230 lb 2.6 oz (104.4 kg) IBW/kg (Calculated) : 36.99  Adjusted Body Weight: ~69 kg  Vital Signs: Temp: 97.8 F (36.6 C) (09/11 1600) Temp src: Oral (09/11 1600) BP: 140/75 mmHg (09/11 1527) Pulse Rate: 86  (09/11 1600) Intake/Output from previous day: 09/10 0701 - 09/11 0700 In: 1470 [I.V.:230; NG/GT:250; IV Piggyback:450] Out: 1600 [Urine:1600] Intake/Output from this shift: Total I/O In: 657 [I.V.:85; Other:270; NG/GT:200; IV Piggyback:102] Out: 600 [Urine:600]  Labs:  Basename 10/24/11 0500 10/23/11 0500 10/22/11 0400  WBC 7.7 8.3 7.1  HGB 10.0* 9.4* 9.1*  PLT 205 195 173  LABCREA -- -- --  CREATININE 1.11* 1.12* 1.02   Estimated Creatinine Clearance: 64 ml/min (by C-G formula based on Cr of 1.11). No results found for this basename: VANCOTROUGH:2,VANCOPEAK:2,VANCORANDOM:2,GENTTROUGH:2,GENTPEAK:2,GENTRANDOM:2,TOBRATROUGH:2,TOBRAPEAK:2,TOBRARND:2,AMIKACINPEAK:2,AMIKACINTROU:2,AMIKACIN:2, in the last 72 hours   Microbiology: Recent Results (from the past 720 hour(s))  MRSA PCR SCREENING     Status: Abnormal   Collection Time   10/20/11  3:14 AM      Component Value Range Status Comment   MRSA by PCR POSITIVE (*) NEGATIVE Final   URINE CULTURE     Status: Normal   Collection Time   10/20/11  3:25 AM      Component Value Range Status Comment   Specimen Description URINE, CATHETERIZED   Final    Special Requests NONE   Final    Culture  Setup Time     Final    Value: 10/20/2011 11:06     Two isolates with different morphologies were identified as the same organism.The most resistant organism was reported.   Colony Count >=100,000 COLONIES/ML   Final    Culture PSEUDOMONAS AERUGINOSA   Final    Report Status 10/24/2011 FINAL   Final    Organism ID, Bacteria PSEUDOMONAS AERUGINOSA   Final   CULTURE, BLOOD (ROUTINE X 2)     Status: Normal (Preliminary result)   Collection Time   10/20/11  3:50 AM      Component Value Range Status Comment   Specimen Description BLOOD LEFT ARM   Final    Special Requests BOTTLES DRAWN AEROBIC ONLY 10CC   Final    Culture  Setup Time 10/20/2011 10:12   Final    Culture     Final    Value:        BLOOD CULTURE RECEIVED NO GROWTH TO DATE CULTURE WILL BE HELD FOR 5 DAYS BEFORE ISSUING A FINAL NEGATIVE REPORT   Report Status PENDING   Incomplete   CULTURE, RESPIRATORY     Status: Normal   Collection Time   10/20/11  4:07 AM      Component Value Range Status Comment   Specimen Description SPUTUM   Final    Special Requests NONE   Final    Gram Stain     Final    Value: FEW WBC PRESENT,BOTH PMN AND MONONUCLEAR     NO SQUAMOUS EPITHELIAL CELLS SEEN     FEW GRAM POSITIVE COCCI IN PAIRS     IN CLUSTERS   Culture NORMAL OROPHARYNGEAL FLORA   Final    Report Status 10/22/2011 FINAL   Final   CULTURE, BLOOD (ROUTINE X 2)     Status: Normal (Preliminary result)   Collection Time  10/20/11  4:10 AM      Component Value Range Status Comment   Specimen Description BLOOD LEFT HAND   Final    Special Requests BOTTLES DRAWN AEROBIC ONLY 5CC   Final    Culture  Setup Time 10/20/2011 10:12   Final    Culture     Final    Value:        BLOOD CULTURE RECEIVED NO GROWTH TO DATE CULTURE WILL BE HELD FOR 5 DAYS BEFORE ISSUING A FINAL NEGATIVE REPORT   Report Status PENDING   Incomplete   CULTURE, RESPIRATORY     Status: Normal   Collection Time   10/20/11  8:27 AM      Component Value Range Status Comment   Specimen Description TRACHEAL ASPIRATE   Final    Special Requests NONE   Final    Gram Stain     Final    Value: ABUNDANT WBC PRESENT,BOTH PMN AND MONONUCLEAR     RARE SQUAMOUS EPITHELIAL CELLS PRESENT     RARE GRAM POSITIVE COCCI     IN PAIRS RARE GRAM POSITIVE RODS   Culture Non-Pathogenic  Oropharyngeal-type Flora Isolated.   Final    Report Status 10/24/2011 FINAL   Final   CULTURE, RESPIRATORY     Status: Normal   Collection Time   10/20/11  9:30 PM      Component Value Range Status Comment   Specimen Description TRACHEAL ASPIRATE   Final    Special Requests NONE   Final    Gram Stain     Final    Value: FEW WBC PRESENT, PREDOMINANTLY PMN     NO SQUAMOUS EPITHELIAL CELLS SEEN     RARE GRAM NEGATIVE RODS   Culture     Final    Value: FEW PSEUDOMONAS AERUGINOSA     Note: COLISTIN SENSITIVE  MIC=2ug/mL ETEST results for this drug are "FOR INVESTIGATIONAL USE ONLY" and should NOT be used for clinical purposes.   Report Status 10/24/2011 FINAL   Final    Organism ID, Bacteria PSEUDOMONAS AERUGINOSA   Final     Medical History: Past Medical History  Diagnosis Date  . Respiratory failure   . Dysphagia   . Contracture of hand joint   . Anemia   . Neurogenic bladder   . Anxiety   . Schizophrenia   . Quadriplegia    Assessment: 46 yo quadriplegic female known to pharmacy from previous antibiotic dosing. Pharmacy consulted to begin tobramycin for MDR Pseudomonas pneumonia.  Antibiotics: Levaquin 9/6 >> 9/11 Vancomycin 9/6 >> 9/9 Cefepime 9/6 >> 9/7 Primaxin 9/7 >> 9/11 Tobramycin 9/11 >> Ceftazidime 9/11 >>  Cultures: 9/7 Urine - Pseudomonas (S-cefepime, ceftaz, imipenem, zosyn; R-cipro, gent, tobra) 9/7 Trach asp - Pseudomonas (S-tobra; R-zosyn, cipro, imipenem)  Goal of Therapy:  pneumonia resolution  Plan:  -Tobramycin 7 mg/kg ABW IV q24h (extended interval dosing) -Checking 10-hr tobramycin level, will adjust frequency if needed -Monitor renal function which is hard to determine in quadriplegic patient   [employer], [address].D., BCPS Clinical Pharmacist Pager: [phone] 10/24/2011 4:29 PM
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
## 2011-10-25 ENCOUNTER — Inpatient Hospital Stay (HOSPITAL_COMMUNITY): Payer: Medicare Other

## 2011-10-25 LAB — PRO B NATRIURETIC PEPTIDE: Pro B Natriuretic peptide (BNP): 108.3 pg/mL (ref 0–125)

## 2011-10-25 LAB — CBC
HCT: 32.1 % — ABNORMAL LOW (ref 36.0–46.0)
Platelets: 192 10*3/uL (ref 150–400)
RBC: 4.05 MIL/uL (ref 3.87–5.11)
RDW: 13.5 % (ref 11.5–15.5)
WBC: 8.1 10*3/uL (ref 4.0–10.5)

## 2011-10-25 LAB — BASIC METABOLIC PANEL
BUN: 37 mg/dL — ABNORMAL HIGH (ref 6–23)
Chloride: 105 mEq/L (ref 96–112)
GFR calc Af Amer: 69 mL/min — ABNORMAL LOW (ref >90)
Potassium: 3.9 mEq/L (ref 3.5–5.1)

## 2011-10-25 LAB — TOBRAMYCIN LEVEL, RANDOM: Tobramycin Rm: 13.5 ug/mL

## 2011-10-25 LAB — PROCALCITONIN: Procalcitonin: 0.6 ng/mL

## 2011-10-25 LAB — TROPONIN I: Troponin I: <0.3 ng/mL (ref <0.30)

## 2011-10-25 LAB — MAGNESIUM: Magnesium: 2.5 mg/dL (ref 1.5–2.5)

## 2011-10-25 LAB — LACTIC ACID, PLASMA: Lactic Acid, Venous: 1.2 mmol/L (ref 0.5–2.2)

## 2011-10-25 MED ORDER — TOBRAMYCIN SULFATE 80 MG/2ML IJ SOLN: 140.0000 mg | INTRAVENOUS | Status: DC

## 2011-10-25 MED ORDER — SODIUM CHLORIDE 0.9 % IV BOLUS (SEPSIS)
500.0000 mL | Freq: Once | INTRAVENOUS | Status: AC
  Administered 2011-10-25: 500 mL via INTRAVENOUS

## 2011-10-25 MED ORDER — METOPROLOL TARTRATE 12.5 MG HALF TABLET
12.5000 mg | ORAL_TABLET | Freq: Every day | ORAL | Status: DC
  Administered 2011-10-26: 12.5 mg via ORAL
  Filled 2011-10-25: qty 1

## 2011-10-25 MED ORDER — FUROSEMIDE 10 MG/ML IJ SOLN
20.0000 mg | Freq: Every day | INTRAMUSCULAR | Status: DC
  Administered 2011-10-25 – 2011-10-26 (×2): 20 mg via INTRAVENOUS
  Filled 2011-10-25 (×2): qty 2

## 2011-10-25 MED ORDER — ENOXAPARIN SODIUM 60 MG/0.6ML ~~LOC~~ SOLN
0.5000 mg/kg | SUBCUTANEOUS | Status: DC
  Administered 2011-10-26: 55 mg via SUBCUTANEOUS
  Filled 2011-10-25: qty 0.6

## 2011-10-25 NOTE — Progress Notes (Signed)
INFECTIOUS DISEASE PROGRESS NOTE  ID: [name] is a 46 y.o. female with   Principal Problem:  *Healthcare-associated pneumonia Active Problems:  Acute respiratory failure with hypoxia  Anemia of chronic disease  Tracheostomy in place  Neurogenic bladder with chronic Foley catheter  Quadriplegia  Schizophrenia  Anxiety disorder  Hypoxemia  UTI (lower urinary tract infection)  Subjective: "I'm so thirsty.... I'm on a regular diet at the nursing home"  Abtx:  Anti-infectives     Start     Dose/Rate Route Frequency Ordered Stop   10/27/11 1000   tobramycin (NEBCIN) 140 mg in dextrose 5 % 50 mL IVPB        140 mg 107 mL/hr over 30 Minutes Intravenous Every 24 hours 10/25/11 0538     10/24/11 1730   tobramycin (NEBCIN) 484 mg in dextrose 5 % 100 mL IVPB  Status:  Discontinued        7 mg/kg  69 kg (Order-Specific) 112.1 mL/hr over 60 Minutes Intravenous Every 24 hours 10/24/11 1636 10/25/11 0537   10/24/11 1700   cefTAZidime (FORTAZ) 2 g in dextrose 5 % 50 mL IVPB        2 g 100 mL/hr over 30 Minutes Intravenous 3 times per day 10/24/11 1602     10/20/11 2200   levofloxacin (LEVAQUIN) IVPB 750 mg  Status:  Discontinued        750 mg 100 mL/hr over 90 Minutes Intravenous Every 24 hours 10/20/11 0308 10/24/11 1602   10/20/11 2200   vancomycin (VANCOCIN) IVPB 1000 mg/200 mL premix  Status:  Discontinued        1,000 mg 200 mL/hr over 60 Minutes Intravenous Daily at bedtime 10/20/11 0327 10/22/11 0956   10/20/11 1000   vancomycin (VANCOCIN) IVPB 1000 mg/200 mL premix  Status:  Discontinued        1,000 mg 200 mL/hr over 60 Minutes Intravenous Every 12 hours 10/19/11 2159 10/20/11 0327   10/20/11 0400   imipenem-cilastatin (PRIMAXIN) 500 mg in sodium chloride 0.9 % 100 mL IVPB  Status:  Discontinued        500 mg 200 mL/hr over 30 Minutes Intravenous 4 times per day 10/20/11 0310 10/20/11 0327   10/20/11 0400   imipenem-cilastatin (PRIMAXIN) 500 mg in sodium chloride  0.9 % 100 mL IVPB  Status:  Discontinued        500 mg 200 mL/hr over 30 Minutes Intravenous 3 times per day 10/20/11 0327 10/24/11 1602   10/19/11 2200   ceFEPIme (MAXIPIME) 1 g in dextrose 5 % 50 mL IVPB  Status:  Discontinued        1 g 100 mL/hr over 30 Minutes Intravenous 3 times per day 10/19/11 2125 10/20/11 0255   10/19/11 2200   vancomycin (VANCOCIN) 2,000 mg in sodium chloride 0.9 % 500 mL IVPB        2,000 mg 250 mL/hr over 120 Minutes Intravenous  Once 10/19/11 2159 10/20/11 0155   10/19/11 2130   levofloxacin (LEVAQUIN) IVPB 750 mg  Status:  Discontinued        750 mg 100 mL/hr over 90 Minutes Intravenous Every 24 hours 10/19/11 2125 10/20/11 0308          Medications:  Scheduled:   . albuterol  2.5 mg Nebulization Q6H  . antiseptic oral rinse  15 mL Mouth Rinse QID  . aspirin  324 mg Oral Once  . calcium-vitamin D  1 tablet Oral Q breakfast  . cefTAZidime (FORTAZ)  IV  2 g Intravenous Q8H  . chlorhexidine  15 mL Mouth Rinse BID  . Chlorhexidine Gluconate Cloth  6 each Topical Q0600  . clonazePAM  0.5 mg Oral BID  . collagenase   Topical Daily  . docusate  100 mg Oral BID  . enoxaparin  40 mg Subcutaneous Daily  . feeding supplement  30 mL Oral QID  . ferrous sulfate  220 mg Per Tube Q breakfast  . influenza  inactive virus vaccine  0.5 mL Intramuscular Tomorrow-1000  . ipratropium  500 mcg Nebulization Q6H WA  . metoprolol tartrate  12.5 mg Oral BID  . multivitamin with minerals  5 mL Per Tube Daily  . mupirocin ointment  1 application Nasal BID  . OLANZapine  15 mg Oral QHS  . pantoprazole sodium  40 mg Per Tube Q1200  . polyethylene glycol  17 g Oral BID  . polyvinyl alcohol  1 drop Both Eyes TID AC & HS  . potassium chloride  40 mEq Per Tube Once  . potassium chloride      . sodium chloride  500 mL Intravenous Once  . sodium chloride  500 mL Intravenous Once  . sodium chloride  500 mL Intravenous Once  . tobramycin  140 mg Intravenous Q24H  .  traZODone  100 mg Oral QHS  . venlafaxine  100 mg Oral BID WC  . DISCONTD: furosemide  20 mg Intravenous Daily  . DISCONTD: imipenem-cilastatin  500 mg Intravenous Q8H  . DISCONTD: levofloxacin (LEVAQUIN) IV  750 mg Intravenous Q24H  . DISCONTD: tobramycin  7 mg/kg (Order-Specific) Intravenous Q24H    Objective: Vital signs in last 24 hours: Temp:  [97.4 F (36.3 C)-97.9 F (36.6 C)] 97.8 F (36.6 C) (09/12 0826) Pulse Rate:  [72-120] 84  (09/12 0900) Resp:  [12-21] 12  (09/12 0900) BP: (51-147)/(28-81) 94/45 mmHg (09/12 0900) SpO2:  [93 %-100 %] 99 % (09/12 0900) FiO2 (%):  [28 %] 28 % (09/12 0826) Weight:  [106.8 kg (235 lb 7.2 oz)] 106.8 kg (235 lb 7.2 oz) (09/12 0500)   General appearance: alert, cooperative and no distress Resp: rhonchi bilaterally Cardio: regular rate and rhythm GI: normal findings: bowel sounds normal and soft, non-tender  Lab Results  Basename 10/25/11 0329 10/24/11 0500  WBC 8.1 7.7  HGB 9.6* 10.0*  HCT 32.1* 33.9*  NA 147* 144  K 3.9 3.7  CL 105 100  CO2 33* 32  BUN 37* 30*  CREATININE 1.10 1.11*  GLU -- --   Liver Panel No results found for this basename: PROT:2,ALBUMIN:2,AST:2,ALT:2,ALKPHOS:2,BILITOT:2,BILIDIR:2,IBILI:2 in the last 72 hours Sedimentation Rate No results found for this basename: ESRSEDRATE in the last 72 hours C-Reactive Protein No results found for this basename: CRP:2 in the last 72 hours  Microbiology: Recent Results (from the past 240 hour(s))  MRSA PCR SCREENING     Status: Abnormal   Collection Time   10/20/11  3:14 AM      Component Value Range Status Comment   MRSA by PCR POSITIVE (*) NEGATIVE Final   URINE CULTURE     Status: Normal   Collection Time   10/20/11  3:25 AM      Component Value Range Status Comment   Specimen Description URINE, CATHETERIZED   Final    Special Requests NONE   Final    Culture  Setup Time     Final    Value: 10/20/2011 11:06     Two isolates with different morphologies were  identified as the same organism.The most resistant organism was reported.   Colony Count >=100,000 COLONIES/ML   Final    Culture PSEUDOMONAS AERUGINOSA   Final    Report Status 10/24/2011 FINAL   Final    Organism ID, Bacteria PSEUDOMONAS AERUGINOSA   Final   CULTURE, BLOOD (ROUTINE X 2)     Status: Normal (Preliminary result)   Collection Time   10/20/11  3:50 AM      Component Value Range Status Comment   Specimen Description BLOOD LEFT ARM   Final    Special Requests BOTTLES DRAWN AEROBIC ONLY 10CC   Final    Culture  Setup Time 10/20/2011 10:12   Final    Culture     Final    Value:        BLOOD CULTURE RECEIVED NO GROWTH TO DATE CULTURE WILL BE HELD FOR 5 DAYS BEFORE ISSUING A FINAL NEGATIVE REPORT   Report Status PENDING   Incomplete   CULTURE, RESPIRATORY     Status: Normal   Collection Time   10/20/11  4:07 AM      Component Value Range Status Comment   Specimen Description SPUTUM   Final    Special Requests NONE   Final    Gram Stain     Final    Value: FEW WBC PRESENT,BOTH PMN AND MONONUCLEAR     NO SQUAMOUS EPITHELIAL CELLS SEEN     FEW GRAM POSITIVE COCCI IN PAIRS     IN CLUSTERS   Culture NORMAL OROPHARYNGEAL FLORA   Final    Report Status 10/22/2011 FINAL   Final   CULTURE, BLOOD (ROUTINE X 2)     Status: Normal (Preliminary result)   Collection Time   10/20/11  4:10 AM      Component Value Range Status Comment   Specimen Description BLOOD LEFT HAND   Final    Special Requests BOTTLES DRAWN AEROBIC ONLY 5CC   Final    Culture  Setup Time 10/20/2011 10:12   Final    Culture     Final    Value:        BLOOD CULTURE RECEIVED NO GROWTH TO DATE CULTURE WILL BE HELD FOR 5 DAYS BEFORE ISSUING A FINAL NEGATIVE REPORT   Report Status PENDING   Incomplete   CULTURE, RESPIRATORY     Status: Normal   Collection Time   10/20/11  8:27 AM      Component Value Range Status Comment   Specimen Description TRACHEAL ASPIRATE   Final    Special Requests NONE   Final    Gram Stain      Final    Value: ABUNDANT WBC PRESENT,BOTH PMN AND MONONUCLEAR     RARE SQUAMOUS EPITHELIAL CELLS PRESENT     RARE GRAM POSITIVE COCCI     IN PAIRS RARE GRAM POSITIVE RODS   Culture Non-Pathogenic Oropharyngeal-type Flora Isolated.   Final    Report Status 10/24/2011 FINAL   Final   CULTURE, RESPIRATORY     Status: Normal   Collection Time   10/20/11  9:30 PM      Component Value Range Status Comment   Specimen Description TRACHEAL ASPIRATE   Final    Special Requests NONE   Final    Gram Stain     Final    Value: FEW WBC PRESENT, PREDOMINANTLY PMN     NO SQUAMOUS EPITHELIAL CELLS SEEN     RARE GRAM NEGATIVE RODS   Culture  Final    Value: FEW PSEUDOMONAS AERUGINOSA     Note: COLISTIN SENSITIVE  MIC=2ug/mL ETEST results for this drug are "FOR INVESTIGATIONAL USE ONLY" and should NOT be used for clinical purposes.   Report Status 10/24/2011 FINAL   Final    Organism ID, Bacteria PSEUDOMONAS AERUGINOSA   Final     Studies/Results: Dg Chest Port 1 View  10/25/2011  *RADIOLOGY REPORT*  Clinical Data: Pneumonia.  PORTABLE CHEST - 1 VIEW  Comparison: 10/24/2011  Findings: Port-A-Cath tip is stable within the SVC.  Again noted is enlargement of the cardiac silhouette.  There are persistent basilar densities.  Concern for focal consolidation in the retrocardiac space.  Tracheostomy tube is present.  Negative for a pneumothorax.  IMPRESSION: Stable chest radiograph findings.  Persistent basilar densities which could represent a combination of volume loss and airspace disease.  There is concern for focal consolidation at the left lung base.  Pleural effusions cannot be excluded.  Enlargement of the cardiac silhouette.   Original Report Authenticated By: Richarda Overlie, M.D.    Dg Chest Port 1 View  10/24/2011  *RADIOLOGY REPORT*  Clinical Data: Shortness of breath.  Intubated patient.  PORTABLE CHEST - 1 VIEW  Comparison: Chest x-ray 10/23/2011.  Findings: A tracheostomy tube is in place with tip 4.8  cm above the carina. Left subclavian single lumen Port-A-Cath with tip terminating at the superior cavoatrial junction.  Lung volumes are low.  Extensive bibasilar opacities may represent areas of atelectasis and/or consolidation, likely a superimposed small bilateral pleural effusions.  Pulmonary venous congestion without frank pulmonary edema.  Mild cardiomegaly is unchanged. Mediastinal contours are distorted by patient positioning.  IMPRESSION: 1.  Support apparatus, as above. 2.  Persistent low lung volumes with persistent bibasilar atelectasis and/or consolidation with superimpose small bilateral pleural effusions.   Original Report Authenticated By: Florencia Reasons, M.D.      Assessment/Plan: MDR pseudomonas UTI Pneumonia Quadriplegia Sacral and R foot decubitus  Total days of antibiotics 5  Day 2 ceftaz and tobra Cr stable WBC normal, afeberile, respiratory status is unchanged (TC, 28%) CXR unchanged, ? Of consolidation L lung base  Would try to aim for 10 days of current rx.  Speech to eval for swallow eval.  Watch her Cr, possibly back to SNF soon  Johny Sax Infectious Diseases [phone] 10/25/2011, 10:58 AM   LOS: 6 days

## 2011-10-25 NOTE — Progress Notes (Signed)
ANTIBIOTIC CONSULT NOTE - INITIAL  Pharmacy Consult for tobramycin Indication: pneumonia  Allergies  Allergen Reactions  . Latex Other (See Comments)    Reaction unknown    Patient Measurements: Height: 4' 8.3" (143 cm) Weight: 230 lb 2.6 oz (104.4 kg) IBW/kg (Calculated) : 36.99  Adjusted Body Weight: ~69 kg  Vital Signs: Temp: 97.9 F (36.6 C) (09/11 2000) Temp src: Oral (09/11 2000) BP: 94/44 mmHg (09/12 0306) Pulse Rate: 76  (09/12 0306) Intake/Output from previous day: 09/11 0701 - 09/12 0700 In: 2647 [I.V.:245; NG/GT:500; IV Piggyback:1302] Out: 1300 [Urine:1300] Intake/Output from this shift: Total I/O In: 1660 [I.V.:140; Other:270; NG/GT:200; IV Piggyback:1050] Out: 300 [Urine:300]  Labs:  Basename 10/25/11 0329 10/24/11 0500 10/23/11 0500  WBC 8.1 7.7 8.3  HGB 9.6* 10.0* 9.4*  PLT 192 205 195  LABCREA -- -- --  CREATININE 1.10 1.11* 1.12*    Basename 10/25/11 0329  VANCOTROUGH --  Leodis Binet --  Drue Dun --  GENTTROUGH --  GENTPEAK --  GENTRANDOM --  TOBRATROUGH --  TOBRAPEAK --  TOBRARND 13.5*  AMIKACINPEAK --  AMIKACINTROU --  AMIKACIN --    Assessment: 46 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  10 hour level elevated due to underlying renal dysfunction.  Would expect a quadrapleic patient to have a baseline SCr 0.3-0.4. Ms. Leiva's baseline SCr is 1.0-1.1 and her recent tobramycin and vancomycin troughs have demonstrated decreased renal function with her elevated levels.  Cultures: 9/7 Urine - Pseudomonas (S-cefepime, ceftaz, imipenem, zosyn; R-cipro, gent, tobra) 9/7 Trach asp - Pseudomonas (S-tobra; R-zosyn, cipro, imipenem)   Plan:  Tobramycin dose given yesterday eveing should be sufficient x 72 hrs.  Will then start tobramycin 140 mg IV q24h   Geannie Risen, PharmD, BCPS  10/25/2011 5:14 AM

## 2011-10-25 NOTE — Clinical Social Work Note (Signed)
Clinical Social Worker spoke with Marylene Land at [facility] regarding anticipated discharge for tomorrow and they are able to receive patient when medically stable. CSW will continue to follow.   Rozetta Nunnery MSW, [employer] [phone]
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
## 2011-10-25 NOTE — Progress Notes (Addendum)
eLink Physician-Brief Progress Note Patient Name: Tyjai Charbonnet DOB: 1965/10/05 MRN: [id number]  Date of Service  10/25/2011   HPI/Events of Note   BP MAP 56 with sbp 73 - dropped again after initial fluid bolus  RN says ur op poor  - Intake/Output      09/11 0701 - 09/12 0700   I.V. (mL/kg) 185 (1.8)   Other 510   NG/GT 500   IV Piggyback 302   Total Intake(mL/kg) 1497 (14.3)   Urine (mL/kg/hr) 1300 (0.5)   Total Output 1300   Net +197          eICU Interventions  Dc lasix Repeat fluid bolus Check bnp, lactate, pct, mag and phos in AM Monitor   3:19 AM ADDENDUM BP down gain SBP 82 wih MAP 48 Repeat 500cc bolus (3rd ) x 1 now Get the labs now    Intervention Category Major Interventions: Hypotension - evaluation and management  Shamar Kracke 10/25/2011, 1:40 AM

## 2011-10-25 NOTE — Progress Notes (Signed)
eLink Physician-Brief Progress Note Patient Name: [name] DOB: 26-Dec-1965 MRN: [id number]  Date of Service  10/25/2011   HPI/Events of Note  MAP 49 with sbp 82   eICU Interventions  500cc saline bolus   Intervention Category Major Interventions: Hypotension - evaluation and management  Schuyler Olden 10/25/2011, 12:21 AM
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
## 2011-10-26 LAB — CBC
MCV: 80.3 fL (ref 78.0–100.0)
Platelets: 174 10*3/uL (ref 150–400)
RDW: 13.5 % (ref 11.5–15.5)
WBC: 6.9 10*3/uL (ref 4.0–10.5)

## 2011-10-26 LAB — BASIC METABOLIC PANEL
Chloride: 102 mEq/L (ref 96–112)
Creatinine, Ser: 1.03 mg/dL (ref 0.50–1.10)
GFR calc Af Amer: 74 mL/min — ABNORMAL LOW (ref >90)
Potassium: 4 mEq/L (ref 3.5–5.1)

## 2011-10-26 LAB — CULTURE, BLOOD (ROUTINE X 2): Culture: NO GROWTH

## 2011-10-26 LAB — TOBRAMYCIN LEVEL, RANDOM: Tobramycin Rm: 2.2 ug/mL

## 2011-10-26 MED ORDER — ASPIRIN 81 MG PO CHEW: 324.0000 mg | CHEWABLE_TABLET | Freq: Once | ORAL | Status: DC

## 2011-10-26 MED ORDER — JEVITY 1.2 CAL PO LIQD: 1000.0000 mL | ORAL | Status: DC

## 2011-10-26 MED ORDER — DEXTROSE 5 % IV SOLN
2.0000 g | INTRAVENOUS | Status: DC
  Filled 2011-10-26: qty 2

## 2011-10-26 MED ORDER — DEXTROSE 5 % IV SOLN: 2.0000 g | INTRAVENOUS | Status: DC

## 2011-10-26 MED ORDER — DEXTROSE 5 % IV SOLN
100.0000 mg | INTRAVENOUS | Status: DC
  Filled 2011-10-26: qty 2.5

## 2011-10-26 MED ORDER — TOBRAMYCIN SULFATE 80 MG/2ML IJ SOLN: 100.0000 mg | INTRAVENOUS | Status: DC

## 2011-10-26 MED ORDER — TOBRAMYCIN SULFATE 80 MG/2ML IJ SOLN: 140.0000 mg | INTRAVENOUS | Status: DC

## 2011-10-26 MED ORDER — COLLAGENASE 250 UNIT/GM EX OINT: TOPICAL_OINTMENT | Freq: Every day | CUTANEOUS | Status: AC

## 2011-10-26 MED ORDER — PRO-STAT SUGAR FREE PO LIQD: 30.0000 mL | Freq: Four times a day (QID) | ORAL | Status: DC

## 2011-10-26 MED ORDER — DEXTROSE 5 % IV SOLN: 2.0000 g | Freq: Three times a day (TID) | INTRAVENOUS | Status: DC

## 2011-10-26 NOTE — Progress Notes (Signed)
Pt c/o of chest pain 10/10 sharp, mid chest. HR is 120's-130's. BP slightly elevated. Stat EKG reads sinus tach. Call placed to MD. Orders to give prn tylenol for chest pain and continue to monitor pt.  M.Foster Simpson, RN

## 2011-10-26 NOTE — Progress Notes (Signed)
Trach care done, inner canula changed, suctioning. Pasy meir out for sleep. Moderate white mucus with suctioning. Pt tolerated well.

## 2011-10-26 NOTE — Progress Notes (Signed)
Clinical Social Worker received notification that pt is ready for transport.  CSW phoned transport.  CSW to sign off at time of dc.   Angelia Mould, MSW, [place] [phone]
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
## 2011-10-26 NOTE — Progress Notes (Signed)
ANTIBIOTIC CONSULT NOTE - FOLLOW UP  Pharmacy Consult for Ceftazidime and Tobramycin Indication: pneumonia and UTI, MDR Pseudomonas  Allergies  Allergen Reactions  . Latex Other (See Comments)    Reaction unknown    Patient Measurements: Height: 4' 8.3" (143 cm) Weight: 237 lb 7 oz (107.7 kg) IBW/kg (Calculated) : 36.99  Adjusted Body Weight: 70 kg  Vital Signs: Temp: 98.6 F (37 C) (09/13 1200) Temp src: Oral (09/13 1200) BP: 95/56 mmHg (09/13 1200) Pulse Rate: 88  (09/13 1200) Intake/Output from previous day: 09/12 0701 - 09/13 0700 In: 1682 [I.V.:390; NG/GT:500; IV Piggyback:102] Out: 1700 [Urine:1700] Intake/Output from this shift: Total I/O In: 610 [P.O.:360; I.V.:100; Other:150] Out: 1700 [Urine:1700]  Labs:  Basename 10/26/11 0425 10/25/11 0329 10/24/11 0500  WBC 6.9 8.1 7.7  HGB 9.8* 9.6* 10.0*  PLT 174 192 205  LABCREA -- -- --  CREATININE 1.03 1.10 1.11*   Estimated Creatinine Clearance: 70.4 ml/min (by C-G formula based on Cr of 1.03).  Basename 10/26/11 1110 10/25/11 0329  VANCOTROUGH -- --  Leodis Binet -- --  Drue Dun -- --  GENTTROUGH -- --  GENTPEAK -- --  GENTRANDOM -- --  TOBRATROUGH -- --  TOBRAPEAK -- --  TOBRARND 2.2 13.5*  AMIKACINPEAK -- --  AMIKACINTROU -- --  AMIKACIN -- --     Microbiology: Recent Results (from the past 720 hour(s))  MRSA PCR SCREENING     Status: Abnormal   Collection Time   10/20/11  3:14 AM      Component Value Range Status Comment   MRSA by PCR POSITIVE (*) NEGATIVE Final   URINE CULTURE     Status: Normal   Collection Time   10/20/11  3:25 AM      Component Value Range Status Comment   Specimen Description URINE, CATHETERIZED   Final    Special Requests NONE   Final    Culture  Setup Time     Final    Value: 10/20/2011 11:06     Two isolates with different morphologies were identified as the same organism.The most resistant organism was reported.   Colony Count >=100,000 COLONIES/ML   Final    Culture PSEUDOMONAS AERUGINOSA   Final    Report Status 10/24/2011 FINAL   Final    Organism ID, Bacteria PSEUDOMONAS AERUGINOSA   Final   CULTURE, BLOOD (ROUTINE X 2)     Status: Normal   Collection Time   10/20/11  3:50 AM      Component Value Range Status Comment   Specimen Description BLOOD LEFT ARM   Final    Special Requests BOTTLES DRAWN AEROBIC ONLY 10CC   Final    Culture  Setup Time 10/20/2011 10:12   Final    Culture NO GROWTH 5 DAYS   Final    Report Status 10/26/2011 FINAL   Final   CULTURE, RESPIRATORY     Status: Normal   Collection Time   10/20/11  4:07 AM      Component Value Range Status Comment   Specimen Description SPUTUM   Final    Special Requests NONE   Final    Gram Stain     Final    Value: FEW WBC PRESENT,BOTH PMN AND MONONUCLEAR     NO SQUAMOUS EPITHELIAL CELLS SEEN     FEW GRAM POSITIVE COCCI IN PAIRS     IN CLUSTERS   Culture NORMAL OROPHARYNGEAL FLORA   Final    Report Status 10/22/2011 FINAL   Final  CULTURE, BLOOD (ROUTINE X 2)     Status: Normal   Collection Time   10/20/11  4:10 AM      Component Value Range Status Comment   Specimen Description BLOOD LEFT HAND   Final    Special Requests BOTTLES DRAWN AEROBIC ONLY 5CC   Final    Culture  Setup Time 10/20/2011 10:12   Final    Culture NO GROWTH 5 DAYS   Final    Report Status 10/26/2011 FINAL   Final   CULTURE, RESPIRATORY     Status: Normal   Collection Time   10/20/11  8:27 AM      Component Value Range Status Comment   Specimen Description TRACHEAL ASPIRATE   Final    Special Requests NONE   Final    Gram Stain     Final    Value: ABUNDANT WBC PRESENT,BOTH PMN AND MONONUCLEAR     RARE SQUAMOUS EPITHELIAL CELLS PRESENT     RARE GRAM POSITIVE COCCI     IN PAIRS RARE GRAM POSITIVE RODS   Culture Non-Pathogenic Oropharyngeal-type Flora Isolated.   Final    Report Status 10/24/2011 FINAL   Final   CULTURE, RESPIRATORY     Status: Normal   Collection Time   10/20/11  9:30 PM      Component  Value Range Status Comment   Specimen Description TRACHEAL ASPIRATE   Final    Special Requests NONE   Final    Gram Stain     Final    Value: FEW WBC PRESENT, PREDOMINANTLY PMN     NO SQUAMOUS EPITHELIAL CELLS SEEN     RARE GRAM NEGATIVE RODS   Culture     Final    Value: FEW PSEUDOMONAS AERUGINOSA     Note: COLISTIN SENSITIVE  MIC=2ug/mL ETEST results for this drug are "FOR INVESTIGATIONAL USE ONLY" and should NOT be used for clinical purposes.   Report Status 10/24/2011 FINAL   Final    Organism ID, Bacteria PSEUDOMONAS AERUGINOSA   Final     Anti-infectives     Start     Dose/Rate Route Frequency Ordered Stop   10/27/11 1000   tobramycin (NEBCIN) 140 mg in dextrose 5 % 50 mL IVPB        140 mg 107 mL/hr over 30 Minutes Intravenous Every 24 hours 10/25/11 0538     10/26/11 0000   dextrose 5 % SOLN 50 mL with cefTAZidime 2 G SOLR 2 g        2 g 100 mL/hr over 30 Minutes Intravenous Every 8 hours 10/26/11 1227     10/26/11 0000   dextrose 5 % SOLN 50 mL with tobramycin 80 MG/2ML SOLN 140 mg        140 mg 107 mL/hr over 30 Minutes Intravenous Every 24 hours 10/26/11 1227     10/24/11 1730   tobramycin (NEBCIN) 484 mg in dextrose 5 % 100 mL IVPB  Status:  Discontinued        7 mg/kg  69 kg (Order-Specific) 112.1 mL/hr over 60 Minutes Intravenous Every 24 hours 10/24/11 1636 10/25/11 0537   10/24/11 1700   cefTAZidime (FORTAZ) 2 g in dextrose 5 % 50 mL IVPB        2 g 100 mL/hr over 30 Minutes Intravenous 3 times per day 10/24/11 1602     10/20/11 2200   levofloxacin (LEVAQUIN) IVPB 750 mg  Status:  Discontinued        750 mg  100 mL/hr over 90 Minutes Intravenous Every 24 hours 10/20/11 0308 10/24/11 1602   10/20/11 2200   vancomycin (VANCOCIN) IVPB 1000 mg/200 mL premix  Status:  Discontinued        1,000 mg 200 mL/hr over 60 Minutes Intravenous Daily at bedtime 10/20/11 0327 10/22/11 0956   10/20/11 1000   vancomycin (VANCOCIN) IVPB 1000 mg/200 mL premix  Status:   Discontinued        1,000 mg 200 mL/hr over 60 Minutes Intravenous Every 12 hours 10/19/11 2159 10/20/11 0327   10/20/11 0400   imipenem-cilastatin (PRIMAXIN) 500 mg in sodium chloride 0.9 % 100 mL IVPB  Status:  Discontinued        500 mg 200 mL/hr over 30 Minutes Intravenous 4 times per day 10/20/11 0310 10/20/11 0327   10/20/11 0400   imipenem-cilastatin (PRIMAXIN) 500 mg in sodium chloride 0.9 % 100 mL IVPB  Status:  Discontinued        500 mg 200 mL/hr over 30 Minutes Intravenous 3 times per day 10/20/11 0327 10/24/11 1602   10/19/11 2200   ceFEPIme (MAXIPIME) 1 g in dextrose 5 % 50 mL IVPB  Status:  Discontinued        1 g 100 mL/hr over 30 Minutes Intravenous 3 times per day 10/19/11 2125 10/20/11 0255   10/19/11 2200   vancomycin (VANCOCIN) 2,000 mg in sodium chloride 0.9 % 500 mL IVPB        2,000 mg 250 mL/hr over 120 Minutes Intravenous  Once 10/19/11 2159 10/20/11 0155   10/19/11 2130   levofloxacin (LEVAQUIN) IVPB 750 mg  Status:  Discontinued        750 mg 100 mL/hr over 90 Minutes Intravenous Every 24 hours 10/19/11 2125 10/20/11 0308          Assessment: MDR Pseudomonas isolated in respiratory and urine cultures:  She is on Day #3 of a planned 10 day course of Tobramycin and Ceftazidime.  A random tobramycin level was checked to ensure she was clearing her initial dose and it is almost within the goal trough range.  Her tobramycin pharmacokinetic parameters, coupled with a SCr of 1 in a quadriplegic patient (~3x expected) is evidence that her renal clearance is impaired and her ceftazidime dose also needs adjustment.  Goal of Therapy:  Tobramycin trough level <2  Plan:  Change Tobramycin to 100mg  IV q24h, will plan to give a dose this afternoon prior to discharge. Change Ceftazidime to 2gm IV q24h based on evidence of impaired clearance of tobramycin. Recommend at least twice weekly SCr monitoring while on Tobramycin.   Recommend checking a tobramycin trough  level on Monday 9/16.  Estella Husk, Pharm.D., BCPS Clinical Pharmacist  Phone [phone] Pager [phone] 10/26/2011, 12:44 PM

## 2011-10-26 NOTE — Progress Notes (Signed)
Trach care and suctioning completed

## 2011-10-26 NOTE — Progress Notes (Signed)
Pt was d/c to [facility](SNF) at around 1500,report given to [facility].

## 2011-10-26 NOTE — Progress Notes (Signed)
INFECTIOUS DISEASE PROGRESS NOTE  ID: [name] is a 46 y.o. female with   Principal Problem:  *Healthcare-associated pneumonia Active Problems:  Acute respiratory failure with hypoxia  Anemia of chronic disease  Tracheostomy in place  Neurogenic bladder with chronic Foley catheter  Quadriplegia  Schizophrenia  Anxiety disorder  Hypoxemia  UTI (lower urinary tract infection)  Subjective: Breathing comfortably.   Abtx:  Anti-infectives     Start     Dose/Rate Route Frequency Ordered Stop   10/27/11 1000   tobramycin (NEBCIN) 140 mg in dextrose 5 % 50 mL IVPB        140 mg 107 mL/hr over 30 Minutes Intravenous Every 24 hours 10/25/11 0538     10/24/11 1730   tobramycin (NEBCIN) 484 mg in dextrose 5 % 100 mL IVPB  Status:  Discontinued        7 mg/kg  69 kg (Order-Specific) 112.1 mL/hr over 60 Minutes Intravenous Every 24 hours 10/24/11 1636 10/25/11 0537   10/24/11 1700   cefTAZidime (FORTAZ) 2 g in dextrose 5 % 50 mL IVPB        2 g 100 mL/hr over 30 Minutes Intravenous 3 times per day 10/24/11 1602     10/20/11 2200   levofloxacin (LEVAQUIN) IVPB 750 mg  Status:  Discontinued        750 mg 100 mL/hr over 90 Minutes Intravenous Every 24 hours 10/20/11 0308 10/24/11 1602   10/20/11 2200   vancomycin (VANCOCIN) IVPB 1000 mg/200 mL premix  Status:  Discontinued        1,000 mg 200 mL/hr over 60 Minutes Intravenous Daily at bedtime 10/20/11 0327 10/22/11 0956   10/20/11 1000   vancomycin (VANCOCIN) IVPB 1000 mg/200 mL premix  Status:  Discontinued        1,000 mg 200 mL/hr over 60 Minutes Intravenous Every 12 hours 10/19/11 2159 10/20/11 0327   10/20/11 0400   imipenem-cilastatin (PRIMAXIN) 500 mg in sodium chloride 0.9 % 100 mL IVPB  Status:  Discontinued        500 mg 200 mL/hr over 30 Minutes Intravenous 4 times per day 10/20/11 0310 10/20/11 0327   10/20/11 0400   imipenem-cilastatin (PRIMAXIN) 500 mg in sodium chloride 0.9 % 100 mL IVPB  Status:   Discontinued        500 mg 200 mL/hr over 30 Minutes Intravenous 3 times per day 10/20/11 0327 10/24/11 1602   10/19/11 2200   ceFEPIme (MAXIPIME) 1 g in dextrose 5 % 50 mL IVPB  Status:  Discontinued        1 g 100 mL/hr over 30 Minutes Intravenous 3 times per day 10/19/11 2125 10/20/11 0255   10/19/11 2200   vancomycin (VANCOCIN) 2,000 mg in sodium chloride 0.9 % 500 mL IVPB        2,000 mg 250 mL/hr over 120 Minutes Intravenous  Once 10/19/11 2159 10/20/11 0155   10/19/11 2130   levofloxacin (LEVAQUIN) IVPB 750 mg  Status:  Discontinued        750 mg 100 mL/hr over 90 Minutes Intravenous Every 24 hours 10/19/11 2125 10/20/11 0308          Medications:  Scheduled:   . albuterol  2.5 mg Nebulization Q6H  . antiseptic oral rinse  15 mL Mouth Rinse QID  . aspirin  324 mg Oral Once  . calcium-vitamin D  1 tablet Oral Q breakfast  . cefTAZidime (FORTAZ)  IV  2 g Intravenous Q8H  . chlorhexidine  15 mL Mouth Rinse BID  . clonazePAM  0.5 mg Oral BID  . collagenase   Topical Daily  . docusate  100 mg Oral BID  . enoxaparin (LOVENOX) injection  0.5 mg/kg Subcutaneous Q24H  . feeding supplement  30 mL Oral QID  . ferrous sulfate  220 mg Per Tube Q breakfast  . furosemide  20 mg Intravenous Daily  . influenza  inactive virus vaccine  0.5 mL Intramuscular Tomorrow-1000  . ipratropium  500 mcg Nebulization Q6H WA  . metoprolol tartrate  12.5 mg Oral Daily  . multivitamin with minerals  5 mL Per Tube Daily  . mupirocin ointment  1 application Nasal BID  . OLANZapine  15 mg Oral QHS  . pantoprazole sodium  40 mg Per Tube Q1200  . polyethylene glycol  17 g Oral BID  . polyvinyl alcohol  1 drop Both Eyes TID AC & HS  . tobramycin  140 mg Intravenous Q24H  . traZODone  100 mg Oral QHS  . venlafaxine  100 mg Oral BID WC  . DISCONTD: enoxaparin  40 mg Subcutaneous Daily  . DISCONTD: metoprolol tartrate  12.5 mg Oral BID    Objective: Vital signs in last 24 hours: Temp:  [98 F  (36.7 C)-98.7 F (37.1 C)] 98 F (36.7 C) (09/13 0325) Pulse Rate:  [68-133] 99  (09/13 0934) Resp:  [11-24] 14  (09/13 0934) BP: (78-174)/(39-102) 136/69 mmHg (09/13 0934) SpO2:  [94 %-100 %] 99 % (09/13 0934) FiO2 (%):  [28 %] 28 % (09/13 0934) Weight:  [107.7 kg (237 lb 7 oz)] 107.7 kg (237 lb 7 oz) (09/12 2330)   General appearance: alert and cooperative Resp: rales bilaterally and rhonchi bilaterally Cardio: regular rate and rhythm GI: normal findings: bowel sounds normal and soft, non-tender  Lab Results  Basename 10/26/11 0425 10/25/11 0329  WBC 6.9 8.1  HGB 9.8* 9.6*  HCT 33.8* 32.1*  NA 145 147*  K 4.0 3.9  CL 102 105  CO2 35* 33*  BUN 40* 37*  CREATININE 1.03 1.10  GLU -- --   Liver Panel No results found for this basename: PROT:2,ALBUMIN:2,AST:2,ALT:2,ALKPHOS:2,BILITOT:2,BILIDIR:2,IBILI:2 in the last 72 hours Sedimentation Rate No results found for this basename: ESRSEDRATE in the last 72 hours C-Reactive Protein No results found for this basename: CRP:2 in the last 72 hours  Microbiology: Recent Results (from the past 240 hour(s))  MRSA PCR SCREENING     Status: Abnormal   Collection Time   10/20/11  3:14 AM      Component Value Range Status Comment   MRSA by PCR POSITIVE (*) NEGATIVE Final   URINE CULTURE     Status: Normal   Collection Time   10/20/11  3:25 AM      Component Value Range Status Comment   Specimen Description URINE, CATHETERIZED   Final    Special Requests NONE   Final    Culture  Setup Time     Final    Value: 10/20/2011 11:06     Two isolates with different morphologies were identified as the same organism.The most resistant organism was reported.   Colony Count >=100,000 COLONIES/ML   Final    Culture PSEUDOMONAS AERUGINOSA   Final    Report Status 10/24/2011 FINAL   Final    Organism ID, Bacteria PSEUDOMONAS AERUGINOSA   Final   CULTURE, BLOOD (ROUTINE X 2)     Status: Normal   Collection Time   10/20/11  3:50 AM  Component  Value Range Status Comment   Specimen Description BLOOD LEFT ARM   Final    Special Requests BOTTLES DRAWN AEROBIC ONLY 10CC   Final    Culture  Setup Time 10/20/2011 10:12   Final    Culture NO GROWTH 5 DAYS   Final    Report Status 10/26/2011 FINAL   Final   CULTURE, RESPIRATORY     Status: Normal   Collection Time   10/20/11  4:07 AM      Component Value Range Status Comment   Specimen Description SPUTUM   Final    Special Requests NONE   Final    Gram Stain     Final    Value: FEW WBC PRESENT,BOTH PMN AND MONONUCLEAR     NO SQUAMOUS EPITHELIAL CELLS SEEN     FEW GRAM POSITIVE COCCI IN PAIRS     IN CLUSTERS   Culture NORMAL OROPHARYNGEAL FLORA   Final    Report Status 10/22/2011 FINAL   Final   CULTURE, BLOOD (ROUTINE X 2)     Status: Normal   Collection Time   10/20/11  4:10 AM      Component Value Range Status Comment   Specimen Description BLOOD LEFT HAND   Final    Special Requests BOTTLES DRAWN AEROBIC ONLY 5CC   Final    Culture  Setup Time 10/20/2011 10:12   Final    Culture NO GROWTH 5 DAYS   Final    Report Status 10/26/2011 FINAL   Final   CULTURE, RESPIRATORY     Status: Normal   Collection Time   10/20/11  8:27 AM      Component Value Range Status Comment   Specimen Description TRACHEAL ASPIRATE   Final    Special Requests NONE   Final    Gram Stain     Final    Value: ABUNDANT WBC PRESENT,BOTH PMN AND MONONUCLEAR     RARE SQUAMOUS EPITHELIAL CELLS PRESENT     RARE GRAM POSITIVE COCCI     IN PAIRS RARE GRAM POSITIVE RODS   Culture Non-Pathogenic Oropharyngeal-type Flora Isolated.   Final    Report Status 10/24/2011 FINAL   Final   CULTURE, RESPIRATORY     Status: Normal   Collection Time   10/20/11  9:30 PM      Component Value Range Status Comment   Specimen Description TRACHEAL ASPIRATE   Final    Special Requests NONE   Final    Gram Stain     Final    Value: FEW WBC PRESENT, PREDOMINANTLY PMN     NO SQUAMOUS EPITHELIAL CELLS SEEN     RARE GRAM NEGATIVE  RODS   Culture     Final    Value: FEW PSEUDOMONAS AERUGINOSA     Note: COLISTIN SENSITIVE  MIC=2ug/mL ETEST results for this drug are "FOR INVESTIGATIONAL USE ONLY" and should NOT be used for clinical purposes.   Report Status 10/24/2011 FINAL   Final    Organism ID, Bacteria PSEUDOMONAS AERUGINOSA   Final     Studies/Results: Dg Chest Port 1 View  10/25/2011  *RADIOLOGY REPORT*  Clinical Data: Pneumonia.  PORTABLE CHEST - 1 VIEW  Comparison: 10/24/2011  Findings: Port-A-Cath tip is stable within the SVC.  Again noted is enlargement of the cardiac silhouette.  There are persistent basilar densities.  Concern for focal consolidation in the retrocardiac space.  Tracheostomy tube is present.  Negative for a pneumothorax.  IMPRESSION: Stable chest radiograph  findings.  Persistent basilar densities which could represent a combination of volume loss and airspace disease.  There is concern for focal consolidation at the left lung base.  Pleural effusions cannot be excluded.  Enlargement of the cardiac silhouette.   Original Report Authenticated By: Richarda Overlie, M.D.      Assessment/Plan: MDR pseudomonas  UTI  Pneumonia  Quadriplegia  Sacral and R foot decubitus  Total days of antibiotics 6   Day 3 ceftaz and tobra  Cr stable/better WBC normal, afeberile, respiratory status is unchanged (TC, 28%)  Prev CXR unchanged, ? Of consolidation L lung base, not repeated today. Would try to aim for 10 days of current rx.  Appreciate speech eval Watch her Cr, possibly back to SNF soon  Available if questions         Johny Sax Infectious Diseases [phone] 10/26/2011, 10:12 AM   LOS: 7 days

## 2011-11-13 IMAGING — CR DG CHEST 1V PORT
1 series · 1 of 1 positions shown · non-contrast
Comparison: Yesterday

CLINICAL DATA: Respiratory distress

PORTABLE CHEST - 1 VIEW

[AP]
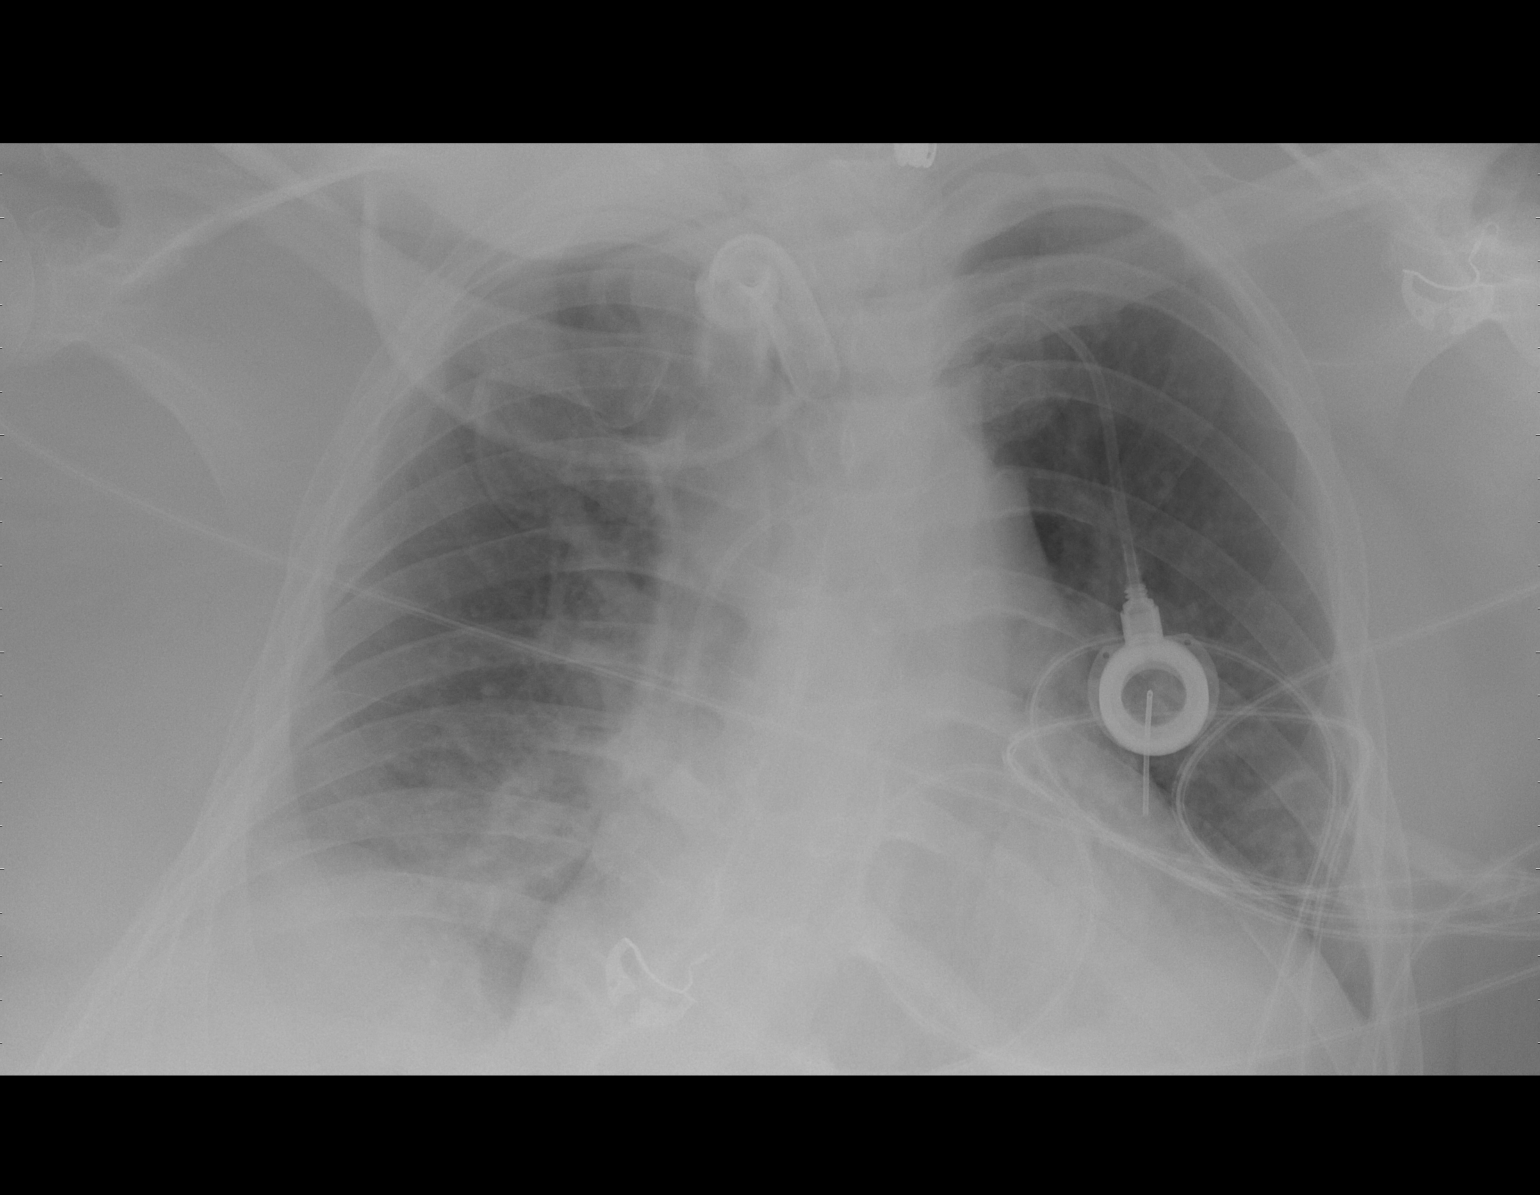

[1 of 1 positions shown; findings below may reference images not displayed]

FINDINGS: Tracheostomy tube and Port-A-Cath are stable.  Increasing
bibasilar hazy airspace disease.  No pneumothorax.  No acute bony
deformity.
IMPRESSION: Increasing basilar hazy airspace disease.

## 2011-11-16 ENCOUNTER — Inpatient Hospital Stay (HOSPITAL_COMMUNITY)
Admission: EM | Admit: 2011-11-16 | Discharge: 2011-11-20 | DRG: 193 | Disposition: A | Discharge status: Other Acute Inpatient Hospital | Payer: Medicare Other | Attending: Internal Medicine | Admitting: Internal Medicine

## 2011-11-16 ENCOUNTER — Encounter (HOSPITAL_COMMUNITY): Payer: Self-pay

## 2011-11-16 ENCOUNTER — Emergency Department (HOSPITAL_COMMUNITY): Payer: Medicare Other

## 2011-11-16 DIAGNOSIS — B965 Pseudomonas (aeruginosa) (mallei) (pseudomallei) as the cause of diseases classified elsewhere: Secondary | ICD-10-CM | POA: Diagnosis present

## 2011-11-16 DIAGNOSIS — G8250 Quadriplegia, unspecified: Secondary | ICD-10-CM

## 2011-11-16 DIAGNOSIS — J9620 Acute and chronic respiratory failure, unspecified whether with hypoxia or hypercapnia: Secondary | ICD-10-CM | POA: Diagnosis present

## 2011-11-16 DIAGNOSIS — IMO0002 Reserved for concepts with insufficient information to code with codable children: Secondary | ICD-10-CM | POA: Diagnosis present

## 2011-11-16 DIAGNOSIS — N289 Disorder of kidney and ureter, unspecified: Secondary | ICD-10-CM | POA: Diagnosis present

## 2011-11-16 DIAGNOSIS — F209 Schizophrenia, unspecified: Secondary | ICD-10-CM

## 2011-11-16 DIAGNOSIS — J189 Pneumonia, unspecified organism: Principal | ICD-10-CM

## 2011-11-16 DIAGNOSIS — N390 Urinary tract infection, site not specified: Secondary | ICD-10-CM

## 2011-11-16 DIAGNOSIS — J309 Allergic rhinitis, unspecified: Secondary | ICD-10-CM | POA: Diagnosis present

## 2011-11-16 DIAGNOSIS — N319 Neuromuscular dysfunction of bladder, unspecified: Secondary | ICD-10-CM

## 2011-11-16 DIAGNOSIS — Z79899 Other long term (current) drug therapy: Secondary | ICD-10-CM

## 2011-11-16 DIAGNOSIS — E669 Obesity, unspecified: Secondary | ICD-10-CM | POA: Diagnosis present

## 2011-11-16 DIAGNOSIS — J9692 Respiratory failure, unspecified with hypercapnia: Secondary | ICD-10-CM

## 2011-11-16 DIAGNOSIS — F419 Anxiety disorder, unspecified: Secondary | ICD-10-CM

## 2011-11-16 DIAGNOSIS — Z6841 Body Mass Index (BMI) 40.0 and over, adult: Secondary | ICD-10-CM

## 2011-11-16 DIAGNOSIS — R079 Chest pain, unspecified: Secondary | ICD-10-CM

## 2011-11-16 DIAGNOSIS — F329 Major depressive disorder, single episode, unspecified: Secondary | ICD-10-CM | POA: Diagnosis present

## 2011-11-16 DIAGNOSIS — J9601 Acute respiratory failure with hypoxia: Secondary | ICD-10-CM

## 2011-11-16 DIAGNOSIS — Z7982 Long term (current) use of aspirin: Secondary | ICD-10-CM

## 2011-11-16 DIAGNOSIS — D638 Anemia in other chronic diseases classified elsewhere: Secondary | ICD-10-CM

## 2011-11-16 DIAGNOSIS — IMO0001 Reserved for inherently not codable concepts without codable children: Secondary | ICD-10-CM

## 2011-11-16 DIAGNOSIS — Z931 Gastrostomy status: Secondary | ICD-10-CM

## 2011-11-16 DIAGNOSIS — Z930 Tracheostomy status: Secondary | ICD-10-CM

## 2011-11-16 DIAGNOSIS — A498 Other bacterial infections of unspecified site: Secondary | ICD-10-CM

## 2011-11-16 DIAGNOSIS — F3289 Other specified depressive episodes: Secondary | ICD-10-CM | POA: Diagnosis present

## 2011-11-16 DIAGNOSIS — R0902 Hypoxemia: Secondary | ICD-10-CM

## 2011-11-16 LAB — COMPREHENSIVE METABOLIC PANEL
ALT: 10 U/L (ref 0–35)
Calcium: 9.7 mg/dL (ref 8.4–10.5)
GFR calc Af Amer: 72 mL/min — ABNORMAL LOW (ref >90)
Glucose, Bld: 105 mg/dL — ABNORMAL HIGH (ref 70–99)
Sodium: 138 mEq/L (ref 135–145)
Total Protein: 8.1 g/dL (ref 6.0–8.3)

## 2011-11-16 LAB — CBC WITH DIFFERENTIAL/PLATELET
Eosinophils Absolute: 0.4 10*3/uL (ref 0.0–0.7)
Eosinophils Relative: 5 % (ref 0–5)
Lymphs Abs: 2.7 10*3/uL (ref 0.7–4.0)
MCH: 23.4 pg — ABNORMAL LOW (ref 26.0–34.0)
MCHC: 30.3 g/dL (ref 30.0–36.0)
MCV: 77.1 fL — ABNORMAL LOW (ref 78.0–100.0)
Platelets: 166 10*3/uL (ref 150–400)
RBC: 4.45 MIL/uL (ref 3.87–5.11)
RDW: 12.9 % (ref 11.5–15.5)

## 2011-11-16 LAB — URINALYSIS, ROUTINE W REFLEX MICROSCOPIC
Nitrite: NEGATIVE
Specific Gravity, Urine: 1.011 (ref 1.005–1.030)
Urobilinogen, UA: 0.2 mg/dL (ref 0.0–1.0)

## 2011-11-16 LAB — POCT I-STAT 3, ART BLOOD GAS (G3+)
O2 Saturation: 98 %
pCO2 arterial: 62.8 mmHg (ref 35.0–45.0)
pH, Arterial: 7.351 (ref 7.350–7.450)

## 2011-11-16 LAB — URINE MICROSCOPIC-ADD ON

## 2011-11-16 LAB — LACTIC ACID, PLASMA: Lactic Acid, Venous: 1.2 mmol/L (ref 0.5–2.2)

## 2011-11-16 MED ORDER — IPRATROPIUM BROMIDE 0.02 % IN SOLN
0.5000 mg | Freq: Once | RESPIRATORY_TRACT | Status: AC
  Administered 2011-11-16: 0.5 mg via RESPIRATORY_TRACT
  Filled 2011-11-16: qty 2.5

## 2011-11-16 MED ORDER — PIPERACILLIN-TAZOBACTAM 3.375 G IVPB
3.3750 g | Freq: Once | INTRAVENOUS | Status: AC
  Administered 2011-11-16: 3.375 g via INTRAVENOUS

## 2011-11-16 MED ORDER — SODIUM CHLORIDE 0.9 % IV SOLN: INTRAVENOUS | Status: DC

## 2011-11-16 MED ORDER — ALBUTEROL SULFATE (5 MG/ML) 0.5% IN NEBU: 2.5000 mg | INHALATION_SOLUTION | RESPIRATORY_TRACT | Status: DC | PRN

## 2011-11-16 MED ORDER — PIPERACILLIN-TAZOBACTAM 3.375 G IVPB
3.3750 g | Freq: Once | INTRAVENOUS | Status: DC
  Filled 2011-11-16: qty 50

## 2011-11-16 MED ORDER — MORPHINE SULFATE 4 MG/ML IJ SOLN
4.0000 mg | Freq: Once | INTRAMUSCULAR | Status: AC
  Administered 2011-11-16: 4 mg via INTRAVENOUS
  Filled 2011-11-16: qty 1

## 2011-11-16 MED ORDER — HYDROMORPHONE HCL PF 1 MG/ML IJ SOLN: 1.0000 mg | INTRAMUSCULAR | Status: DC | PRN

## 2011-11-16 MED ORDER — VANCOMYCIN HCL IN DEXTROSE 1-5 GM/200ML-% IV SOLN
1000.0000 mg | Freq: Once | INTRAVENOUS | Status: AC
  Administered 2011-11-16: 1000 mg via INTRAVENOUS
  Filled 2011-11-16: qty 200

## 2011-11-16 MED ORDER — HYDROMORPHONE HCL PF 1 MG/ML IJ SOLN
1.0000 mg | Freq: Once | INTRAMUSCULAR | Status: AC
  Administered 2011-11-16: 1 mg via INTRAVENOUS
  Filled 2011-11-16: qty 1

## 2011-11-16 MED ORDER — ALBUTEROL SULFATE (5 MG/ML) 0.5% IN NEBU
5.0000 mg | INHALATION_SOLUTION | Freq: Once | RESPIRATORY_TRACT | Status: AC
  Administered 2011-11-16: 5 mg via RESPIRATORY_TRACT
  Filled 2011-11-16: qty 40

## 2011-11-16 MED ORDER — ONDANSETRON HCL 4 MG/2ML IJ SOLN
4.0000 mg | Freq: Once | INTRAMUSCULAR | Status: AC
  Administered 2011-11-16: 4 mg via INTRAVENOUS
  Filled 2011-11-16: qty 2

## 2011-11-16 MED ORDER — DEXTROSE 50 % IV SOLN
INTRAVENOUS | Status: AC
  Filled 2011-11-16: qty 50

## 2011-11-16 MED ORDER — ONDANSETRON HCL 4 MG/2ML IJ SOLN: 4.0000 mg | Freq: Three times a day (TID) | INTRAMUSCULAR | Status: DC | PRN

## 2011-11-16 NOTE — ED Notes (Signed)
Report called to 2600 pt to have ct chest before transport to 2600

## 2011-11-16 NOTE — ED Notes (Signed)
Pt from [facility], last night with chest pain stomach pain and throat pain, did not call due to not feeling like leaving, no new SOB, filtered trach, indwelling foley, porta cath left chest, staff gave NTG times three

## 2011-11-16 NOTE — ED Notes (Signed)
Respiratory at bedside.

## 2011-11-16 NOTE — ED Provider Notes (Signed)
History     CSN: [id number]  Arrival date & time 11/16/11  4098   First MD Initiated Contact with Patient 11/16/11 1926      Chief Complaint  Patient presents with  . Chest Pain    (Consider location/radiation/quality/duration/timing/severity/associated sxs/prior treatment) HPI Comments: Quadriplegic with tracheostomy and neurogenic bladder with indwelling Foley, admission 3 weeks ago for pneumonia presenting with chest pain, lower abdominal pain, cough, increased trach secretions and throat pain for the past day. She feels that she has pneumonia again. She denies any vomiting but has felt nauseated. She has suprapubic abdominal pain.  The history is provided by the patient and the EMS personnel.    Past Medical History  Diagnosis Date  . Respiratory failure   . Dysphagia   . Contracture of hand joint   . Anemia   . Neurogenic bladder   . Anxiety   . Schizophrenia   . Quadriplegia     Past Surgical History  Procedure Date  . Tracheostomy   . Gastrostomy w/ feeding tube     History reviewed. No pertinent family history.  History  Substance Use Topics  . Smoking status: Never Smoker   . Smokeless tobacco: Not on file  . Alcohol Use: No    OB History    Grav Para Term Preterm Abortions TAB SAB Ect Mult Living                  Review of Systems  Unable to perform ROS Constitutional: Negative for activity change and appetite change.  Cardiovascular: Positive for chest pain.  Genitourinary: Negative for dysuria and hematuria.    Allergies  Latex  Home Medications   Current Outpatient Rx  Name Route Sig Dispense Refill  . ACETAMINOPHEN 500 MG PO TABS Oral Take 1,000 mg by mouth 2 (two) times daily as needed. For arthritic pain    . BIOTENE DRY MOUTH MT LIQD Mouth Rinse 15 mLs by Mouth Rinse route 2 (two) times daily.    . ASPIRIN 81 MG PO CHEW Oral Chew 324 mg by mouth daily.    Marland Kitchen CALCIUM CARBONATE-VITAMIN D 500-200 MG-UNIT PO TABS Oral Take 1 tablet by  mouth every morning.    . CEFTAZIDIME 2 G/50 ML IVPB MIXTURE Intravenous Inject 2 g into the vein every 8 (eight) hours.    . TOBRAMYCIN STANDARD DOSING IVPB Intravenous Inject 140 mg into the vein daily.    Marland Kitchen DOCUSATE SODIUM 100 MG PO CAPS Oral Take 100 mg by mouth 2 (two) times daily.    Marland Kitchen PRO-STAT 64 PO LIQD Oral Take 30 mLs by mouth 4 (four) times daily. 900 mL   . FERROUS SULFATE 325 (65 FE) MG PO TABS Oral Take 325 mg by mouth daily with breakfast.    . FLUTICASONE PROPIONATE 50 MCG/ACT NA SUSP Nasal Place 1 spray into the nose daily.    . FUROSEMIDE 20 MG PO TABS Oral Take 20 mg by mouth daily.    . IPRATROPIUM BROMIDE 0.02 % IN SOLN Nebulization Take 500 mcg by nebulization every 6 (six) hours. With albuterol    . LEVALBUTEROL HCL 0.63 MG/3ML IN NEBU Nebulization Take 0.63 mg by nebulization every 6 (six) hours.    Marland Kitchen METOPROLOL TARTRATE 25 MG PO TABS Oral Take 12.5 mg by mouth 2 (two) times daily. Given at 1000 & 2200    . CERTA-VITE PO Oral Take 1 tablet by mouth every morning.     Marland Kitchen OLANZAPINE 15 MG PO TABS Oral  Take 15 mg by mouth at bedtime.    . OMEPRAZOLE 20 MG PO CPDR Oral Take 20 mg by mouth 2 (two) times daily.    Marland Kitchen ONDANSETRON HCL 4 MG PO TABS Oral Take 4 mg by mouth every 6 (six) hours as needed. For nausea    . OXYBUTYNIN CHLORIDE 5 MG PO TABS Oral Take 5 mg by mouth 2 (two) times daily as needed. For bladder spasms    . OXYCODONE HCL 5 MG PO CAPS Oral Take 10 mg by mouth every 6 (six) hours as needed. For pain    . POLYETHYLENE GLYCOL 3350 PO PACK Oral Take 17 g by mouth 2 (two) times daily.    . SENNA 8.6 MG PO TABS Oral Take 2 tablets by mouth daily as needed. For constipation    . LISTERMINT MT Mouth/Throat Use as directed 15 mLs in the mouth or throat 2 (two) times daily. Swish & spit    . TRAZODONE HCL 100 MG PO TABS Oral Take 100 mg by mouth at bedtime.    . TRAZODONE HCL 100 MG PO TABS Oral Take 100 mg by mouth at bedtime.    . VENLAFAXINE HCL 100 MG PO TABS Oral  Take 100 mg by mouth 2 (two) times daily.      BP 175/85  Pulse 110  Temp 98.2 F (36.8 C) (Oral)  Resp 17  SpO2 100%  Physical Exam  Constitutional: She is oriented to person, place, and time. She appears well-developed and well-nourished. No distress.  HENT:  Head: Normocephalic and atraumatic.  Mouth/Throat: Oropharynx is clear and moist. No oropharyngeal exudate.  Eyes: Conjunctivae normal and EOM are normal. Pupils are equal, round, and reactive to light.  Neck: Normal range of motion. Neck supple.  Cardiovascular: Normal rate, regular rhythm and normal heart sounds.        Tachycardic  Pulmonary/Chest: Effort normal.       Poor air exchange bilaterally, coarse breath sounds  Abdominal: Soft.       Suprapubic tenderness  Musculoskeletal: Normal range of motion. She exhibits no edema and no tenderness.  Neurological: She is alert and oriented to person, place, and time.       Upper extremities contractures.  Skin: Skin is warm.    ED Course  Procedures (including critical care time)   Labs Reviewed  CBC WITH DIFFERENTIAL  COMPREHENSIVE METABOLIC PANEL  LACTIC ACID, PLASMA  URINALYSIS, ROUTINE W REFLEX MICROSCOPIC  CULTURE, BLOOD (ROUTINE X 2)  CULTURE, BLOOD (ROUTINE X 2)  TROPONIN I  URINALYSIS, ROUTINE W REFLEX MICROSCOPIC  URINE CULTURE   No results found.   No diagnosis found.    MDM  Quadriplegic with tracheostomy and chronic respiratory failure presenting with chest pain, stomach pain, throat pain. Recent admission for pneumonia.  Chest x-ray shows improved infiltrate bilaterally. ABG consistent with acute on chronic hypercarbic respiratory failure. Continues to complain of chest pain despite medications. Her pain is atypical for ACS.  Patient given nebulizers, broad spectrum antibiotics. Chest pain appears to be pleuritic, possibly from HCAP.  Will obtain CTPE to rule out PE. Given hypoxia and increased O2 requirement and ongoing pain, will  admit.   Date: 11/16/2011  Rate: 113  Rhythm: sinus tachycardia  QRS Axis: normal  Intervals: normal  ST/T Wave abnormalities: normal  Conduction Disutrbances:none  Narrative Interpretation:   Old EKG Reviewed: unchanged  CRITICAL CARE Performed by: Glynn Octave   Total critical care time: 30  Critical care time was exclusive  of separately billable procedures and treating other patients.  Critical care was necessary to treat or prevent imminent or life-threatening deterioration.  Critical care was time spent personally by me on the following activities: development of treatment plan with patient and/or surrogate as well as nursing, discussions with consultants, evaluation of patient's response to treatment, examination of patient, obtaining history from patient or surrogate, ordering and performing treatments and interventions, ordering and review of laboratory studies, ordering and review of radiographic studies, pulse oximetry and re-evaluation of patient's condition.   Glynn Octave, MD 11/17/11 1152

## 2011-11-16 NOTE — ED Notes (Signed)
IV team paged for port-a-cath access.  

## 2011-11-16 NOTE — ED Notes (Signed)
Dr. Jenkins MD at bedside. 

## 2011-11-17 ENCOUNTER — Encounter (HOSPITAL_COMMUNITY): Payer: Self-pay | Admitting: Internal Medicine

## 2011-11-17 ENCOUNTER — Inpatient Hospital Stay (HOSPITAL_COMMUNITY): Payer: Medicare Other

## 2011-11-17 DIAGNOSIS — J189 Pneumonia, unspecified organism: Principal | ICD-10-CM

## 2011-11-17 DIAGNOSIS — D638 Anemia in other chronic diseases classified elsewhere: Secondary | ICD-10-CM

## 2011-11-17 DIAGNOSIS — G8250 Quadriplegia, unspecified: Secondary | ICD-10-CM

## 2011-11-17 DIAGNOSIS — J9600 Acute respiratory failure, unspecified whether with hypoxia or hypercapnia: Secondary | ICD-10-CM

## 2011-11-17 DIAGNOSIS — R079 Chest pain, unspecified: Secondary | ICD-10-CM

## 2011-11-17 DIAGNOSIS — N390 Urinary tract infection, site not specified: Secondary | ICD-10-CM

## 2011-11-17 LAB — BASIC METABOLIC PANEL
GFR calc Af Amer: 69 mL/min — ABNORMAL LOW (ref >90)
GFR calc non Af Amer: 60 mL/min — ABNORMAL LOW (ref >90)
Potassium: 4 mEq/L (ref 3.5–5.1)
Sodium: 141 mEq/L (ref 135–145)

## 2011-11-17 LAB — TROPONIN I
Troponin I: <0.3 ng/mL (ref <0.30)
Troponin I: <0.3 ng/mL (ref <0.30)

## 2011-11-17 LAB — CBC
Hemoglobin: 9.4 g/dL — ABNORMAL LOW (ref 12.0–15.0)
RBC: 4.03 MIL/uL (ref 3.87–5.11)
WBC: 8.3 10*3/uL (ref 4.0–10.5)

## 2011-11-17 MED ORDER — VANCOMYCIN HCL IN DEXTROSE 1-5 GM/200ML-% IV SOLN
1000.0000 mg | INTRAVENOUS | Status: DC
  Administered 2011-11-18: 1000 mg via INTRAVENOUS
  Filled 2011-11-17 (×2): qty 200

## 2011-11-17 MED ORDER — BIOTENE DRY MOUTH MT LIQD
15.0000 mL | Freq: Two times a day (BID) | OROMUCOSAL | Status: DC
  Administered 2011-11-17 – 2011-11-20 (×6): 15 mL via OROMUCOSAL

## 2011-11-17 MED ORDER — OXYBUTYNIN CHLORIDE 5 MG PO TABS
5.0000 mg | ORAL_TABLET | Freq: Two times a day (BID) | ORAL | Status: DC | PRN
  Filled 2011-11-17: qty 1

## 2011-11-17 MED ORDER — ONDANSETRON HCL 4 MG PO TABS: 4.0000 mg | ORAL_TABLET | Freq: Four times a day (QID) | ORAL | Status: DC | PRN

## 2011-11-17 MED ORDER — ASPIRIN 81 MG PO CHEW
324.0000 mg | CHEWABLE_TABLET | Freq: Every day | ORAL | Status: DC
  Administered 2011-11-17 – 2011-11-20 (×4): 324 mg via ORAL
  Filled 2011-11-17 (×4): qty 4

## 2011-11-17 MED ORDER — SODIUM CHLORIDE 0.9 % IV BOLUS (SEPSIS)
1000.0000 mL | Freq: Once | INTRAVENOUS | Status: AC
  Administered 2011-11-17: 1000 mL via INTRAVENOUS

## 2011-11-17 MED ORDER — OXYCODONE HCL 5 MG PO CAPS: 10.0000 mg | ORAL_CAPSULE | Freq: Four times a day (QID) | ORAL | Status: DC | PRN

## 2011-11-17 MED ORDER — LEVALBUTEROL HCL 0.63 MG/3ML IN NEBU
0.6300 mg | INHALATION_SOLUTION | Freq: Four times a day (QID) | RESPIRATORY_TRACT | Status: DC
  Administered 2011-11-17 – 2011-11-20 (×13): 0.63 mg via RESPIRATORY_TRACT
  Filled 2011-11-17 (×16): qty 3

## 2011-11-17 MED ORDER — FLUTICASONE PROPIONATE 50 MCG/ACT NA SUSP
1.0000 | Freq: Every day | NASAL | Status: DC
  Administered 2011-11-17 – 2011-11-20 (×4): 1 via NASAL
  Filled 2011-11-17: qty 16

## 2011-11-17 MED ORDER — ZOLPIDEM TARTRATE 5 MG PO TABS
5.0000 mg | ORAL_TABLET | Freq: Every evening | ORAL | Status: DC | PRN
  Administered 2011-11-19: 5 mg via ORAL
  Filled 2011-11-17: qty 1

## 2011-11-17 MED ORDER — MENTHOL 3 MG MT LOZG
1.0000 | LOZENGE | OROMUCOSAL | Status: DC | PRN
  Filled 2011-11-17: qty 9

## 2011-11-17 MED ORDER — TRAZODONE HCL 100 MG PO TABS
100.0000 mg | ORAL_TABLET | Freq: Every day | ORAL | Status: DC
  Administered 2011-11-17 – 2011-11-19 (×3): 100 mg via ORAL
  Filled 2011-11-17 (×4): qty 1

## 2011-11-17 MED ORDER — ACETAMINOPHEN 650 MG RE SUPP: 650.0000 mg | Freq: Four times a day (QID) | RECTAL | Status: DC | PRN

## 2011-11-17 MED ORDER — METOPROLOL TARTRATE 12.5 MG HALF TABLET
12.5000 mg | ORAL_TABLET | Freq: Two times a day (BID) | ORAL | Status: DC
  Administered 2011-11-17 – 2011-11-20 (×7): 12.5 mg via ORAL
  Filled 2011-11-17 (×8): qty 1

## 2011-11-17 MED ORDER — VENLAFAXINE HCL 50 MG PO TABS
100.0000 mg | ORAL_TABLET | Freq: Two times a day (BID) | ORAL | Status: DC
  Administered 2011-11-17 – 2011-11-20 (×7): 100 mg via ORAL
  Filled 2011-11-17 (×8): qty 2

## 2011-11-17 MED ORDER — SODIUM CHLORIDE 0.9 % IV SOLN: INTRAVENOUS | Status: DC

## 2011-11-17 MED ORDER — PRO-STAT SUGAR FREE PO LIQD
30.0000 mL | Freq: Four times a day (QID) | ORAL | Status: DC
  Administered 2011-11-17 – 2011-11-20 (×13): 30 mL via ORAL
  Filled 2011-11-17 (×17): qty 30

## 2011-11-17 MED ORDER — CALCIUM CARBONATE-VITAMIN D 500-200 MG-UNIT PO TABS
1.0000 | ORAL_TABLET | Freq: Every morning | ORAL | Status: DC
  Administered 2011-11-17 – 2011-11-20 (×4): 1 via ORAL
  Filled 2011-11-17 (×4): qty 1

## 2011-11-17 MED ORDER — OLANZAPINE 7.5 MG PO TABS
15.0000 mg | ORAL_TABLET | Freq: Every day | ORAL | Status: DC
  Administered 2011-11-17 – 2011-11-19 (×3): 15 mg via ORAL
  Filled 2011-11-17 (×4): qty 2

## 2011-11-17 MED ORDER — PIPERACILLIN-TAZOBACTAM 3.375 G IVPB
3.3750 g | Freq: Three times a day (TID) | INTRAVENOUS | Status: DC
  Administered 2011-11-17 – 2011-11-20 (×11): 3.375 g via INTRAVENOUS
  Filled 2011-11-17 (×14): qty 50

## 2011-11-17 MED ORDER — ALUM & MAG HYDROXIDE-SIMETH 200-200-20 MG/5ML PO SUSP: 30.0000 mL | Freq: Four times a day (QID) | ORAL | Status: DC | PRN

## 2011-11-17 MED ORDER — BIOTENE DRY MOUTH MT LIQD: 15.0000 mL | Freq: Two times a day (BID) | OROMUCOSAL | Status: DC

## 2011-11-17 MED ORDER — IPRATROPIUM BROMIDE 0.02 % IN SOLN
500.0000 ug | Freq: Four times a day (QID) | RESPIRATORY_TRACT | Status: DC
Start: 2011-11-17 — End: 2011-11-20
  Administered 2011-11-17 – 2011-11-20 (×13): 500 ug via RESPIRATORY_TRACT
  Filled 2011-11-17 (×13): qty 2.5

## 2011-11-17 MED ORDER — POLYETHYLENE GLYCOL 3350 17 G PO PACK
17.0000 g | PACK | Freq: Two times a day (BID) | ORAL | Status: DC
  Administered 2011-11-17 – 2011-11-19 (×4): 17 g via ORAL
  Filled 2011-11-17 (×8): qty 1

## 2011-11-17 MED ORDER — OXYCODONE HCL 5 MG PO TABS
5.0000 mg | ORAL_TABLET | ORAL | Status: DC | PRN
  Administered 2011-11-18 – 2011-11-19 (×5): 5 mg via ORAL
  Filled 2011-11-17 (×5): qty 1

## 2011-11-17 MED ORDER — ALBUTEROL SULFATE (5 MG/ML) 0.5% IN NEBU
2.5000 mg | INHALATION_SOLUTION | Freq: Four times a day (QID) | RESPIRATORY_TRACT | Status: DC
  Administered 2011-11-17 (×2): 2.5 mg via RESPIRATORY_TRACT
  Filled 2011-11-17 (×2): qty 0.5

## 2011-11-17 MED ORDER — ENOXAPARIN SODIUM 40 MG/0.4ML ~~LOC~~ SOLN
40.0000 mg | Freq: Every day | SUBCUTANEOUS | Status: DC
  Administered 2011-11-17 – 2011-11-20 (×4): 40 mg via SUBCUTANEOUS
  Filled 2011-11-17 (×4): qty 0.4

## 2011-11-17 MED ORDER — ALBUTEROL SULFATE (5 MG/ML) 0.5% IN NEBU: 2.5000 mg | INHALATION_SOLUTION | RESPIRATORY_TRACT | Status: DC | PRN

## 2011-11-17 MED ORDER — PHENOL 1.4 % MT LIQD
1.0000 | OROMUCOSAL | Status: DC | PRN
  Administered 2011-11-18: 1 via OROMUCOSAL
  Filled 2011-11-17: qty 177

## 2011-11-17 MED ORDER — FUROSEMIDE 20 MG PO TABS
20.0000 mg | ORAL_TABLET | Freq: Every day | ORAL | Status: DC
  Administered 2011-11-17 – 2011-11-20 (×4): 20 mg via ORAL
  Filled 2011-11-17 (×4): qty 1

## 2011-11-17 MED ORDER — CHLORHEXIDINE GLUCONATE 0.12 % MT SOLN
15.0000 mL | Freq: Two times a day (BID) | OROMUCOSAL | Status: DC
  Administered 2011-11-17 – 2011-11-19 (×6): 15 mL via OROMUCOSAL
  Filled 2011-11-17 (×9): qty 15

## 2011-11-17 MED ORDER — IOHEXOL 350 MG/ML SOLN
100.0000 mL | Freq: Once | INTRAVENOUS | Status: AC | PRN
  Administered 2011-11-17: 100 mL via INTRAVENOUS

## 2011-11-17 MED ORDER — DOCUSATE SODIUM 100 MG PO CAPS
100.0000 mg | ORAL_CAPSULE | Freq: Two times a day (BID) | ORAL | Status: DC
  Administered 2011-11-17 – 2011-11-19 (×6): 100 mg via ORAL
  Filled 2011-11-17 (×8): qty 1

## 2011-11-17 MED ORDER — MUPIROCIN 2 % EX OINT
1.0000 "application " | TOPICAL_OINTMENT | Freq: Two times a day (BID) | CUTANEOUS | Status: DC
  Administered 2011-11-17 – 2011-11-20 (×7): 1 via NASAL
  Filled 2011-11-17: qty 22

## 2011-11-17 MED ORDER — CHLORHEXIDINE GLUCONATE CLOTH 2 % EX PADS
6.0000 | MEDICATED_PAD | Freq: Every day | CUTANEOUS | Status: DC
Start: 2011-11-17 — End: 2011-11-20
  Administered 2011-11-17 – 2011-11-20 (×4): 6 via TOPICAL

## 2011-11-17 MED ORDER — FERROUS SULFATE 325 (65 FE) MG PO TABS
325.0000 mg | ORAL_TABLET | Freq: Every day | ORAL | Status: DC
  Administered 2011-11-18 – 2011-11-20 (×3): 325 mg via ORAL
  Filled 2011-11-17 (×4): qty 1

## 2011-11-17 MED ORDER — ACETAMINOPHEN 325 MG PO TABS: 650.0000 mg | ORAL_TABLET | Freq: Four times a day (QID) | ORAL | Status: DC | PRN

## 2011-11-17 MED ORDER — ONDANSETRON HCL 4 MG/2ML IJ SOLN
4.0000 mg | Freq: Four times a day (QID) | INTRAMUSCULAR | Status: DC | PRN
  Administered 2011-11-20: 4 mg via INTRAVENOUS
  Filled 2011-11-17: qty 2

## 2011-11-17 MED ORDER — HYDROMORPHONE HCL PF 1 MG/ML IJ SOLN
0.5000 mg | INTRAMUSCULAR | Status: DC | PRN
  Administered 2011-11-17 – 2011-11-20 (×10): 1 mg via INTRAVENOUS
  Filled 2011-11-17 (×10): qty 1

## 2011-11-17 MED ORDER — VANCOMYCIN HCL IN DEXTROSE 1-5 GM/200ML-% IV SOLN
1000.0000 mg | Freq: Once | INTRAVENOUS | Status: AC
  Administered 2011-11-17: 1000 mg via INTRAVENOUS
  Filled 2011-11-17: qty 200

## 2011-11-17 MED ORDER — PANTOPRAZOLE SODIUM 40 MG PO TBEC
40.0000 mg | DELAYED_RELEASE_TABLET | Freq: Two times a day (BID) | ORAL | Status: DC
  Administered 2011-11-17 – 2011-11-20 (×6): 40 mg via ORAL
  Filled 2011-11-17 (×6): qty 1

## 2011-11-17 NOTE — ED Notes (Signed)
Pt to ct scan via stretcher

## 2011-11-17 NOTE — H&P (Signed)
[facility] History and Physical  [name] XBJ:[id number] DOB: [birth year] DOA: 11/16/2011  Referring physician:  PCP: Terald Sleeper, MD  Specialists:   Chief Complaint:  Chest Pain and SOB  HPI: [name] is a 46 y.o. female quadriplegic resident from an area SNF who presents with complaints of pleuritic chest pain and SOB worsening over the past 2 days.  She has had more secretions from her trach which are yellow tinged.  She was discharged on 09/13 after a hospitalization for HCAP.  In the ED she was found to be hypoxic with O2 saturation levels to 87% and required an increase in her supplemental Oxygen to 10 liters.     Review of Systems: The patient denies anorexia, fever, weight loss, vision loss, decreased hearing, hoarseness, syncope, abdominal pain, melena, hematochezia, severe indigestion/heartburn, hematuria, genital sores, muscle weakness, suspicious skin lesions, transient blindness,  depression, unusual weight change, abnormal bleeding, enlarged lymph nodes, angioedema, and breast masses.    Past Medical History  Diagnosis Date  . Respiratory failure   . Dysphagia   . Contracture of hand joint   . Anemia   . Neurogenic bladder   . Anxiety   . Schizophrenia   . Quadriplegia    Past Surgical History  Procedure Date  . Tracheostomy   . Gastrostomy w/ feeding tube     Medications:  HOME MEDS: Prior to Admission medications   Medication Sig Start Date End Date Taking? Authorizing Provider  acetaminophen (TYLENOL) 500 MG tablet Take 1,000 mg by mouth 2 (two) times daily as needed. For arthritic pain   Yes Historical Provider, MD  antiseptic oral rinse (BIOTENE) LIQD 15 mLs by Mouth Rinse route 2 (two) times daily. 08/31/11  Yes [name], MD  aspirin 81 MG chewable tablet Chew 324 mg by mouth daily.   Yes Historical Provider, MD  calcium-vitamin D (OSCAL 500/200 D-3) 500-200 MG-UNIT per tablet Take 1 tablet by mouth every morning.   Yes  Historical Provider, MD  dextrose 5 % SOLN 50 mL with cefTAZidime 2 G SOLR 2 g Inject 2 g into the vein every 8 (eight) hours. 10/26/11  Yes William S Minor, NP  dextrose 5 % SOLN 50 mL with tobramycin 80 MG/2ML SOLN 140 mg Inject 140 mg into the vein daily. 10/26/11  Yes Vilinda Blanks Minor, NP  docusate sodium (COLACE) 100 MG capsule Take 100 mg by mouth 2 (two) times daily.   Yes Historical Provider, MD  feeding supplement (PRO-STAT SUGAR FREE 64) LIQD Take 30 mLs by mouth 4 (four) times daily. 10/26/11  Yes Vilinda Blanks Minor, NP  ferrous sulfate 325 (65 FE) MG tablet Take 325 mg by mouth daily with breakfast.   Yes Historical Provider, MD  fluticasone (FLONASE) 50 MCG/ACT nasal spray Place 1 spray into the nose daily.   Yes Historical Provider, MD  furosemide (LASIX) 20 MG tablet Take 20 mg by mouth daily. 08/31/11  Yes Ripudeep K Rai, MD  ipratropium (ATROVENT) 0.02 % nebulizer solution Take 500 mcg by nebulization every 6 (six) hours. With albuterol   Yes Historical Provider, MD  levalbuterol (XOPENEX) 0.63 MG/3ML nebulizer solution Take 0.63 mg by nebulization every 6 (six) hours. 08/31/11 08/30/12 Yes [name], MD  metoprolol tartrate (LOPRESSOR) 25 MG tablet Take 12.5 mg by mouth 2 (two) times daily. Given at 1000 & 2200   Yes Historical Provider, MD  Multiple Vitamins-Minerals (CERTA-VITE PO) Take 1 tablet by mouth every morning.    Yes Historical Provider,  MD  OLANZapine (ZYPREXA) 15 MG tablet Take 15 mg by mouth at bedtime.   Yes Historical Provider, MD  omeprazole (PRILOSEC) 20 MG capsule Take 20 mg by mouth 2 (two) times daily.   Yes Historical Provider, MD  ondansetron (ZOFRAN) 4 MG tablet Take 4 mg by mouth every 6 (six) hours as needed. For nausea   Yes Historical Provider, MD  oxybutynin (DITROPAN) 5 MG tablet Take 5 mg by mouth 2 (two) times daily as needed. For bladder spasms   Yes Historical Provider, MD  oxycodone (OXY-IR) 5 MG capsule Take 10 mg by mouth every 6 (six) hours as  needed. For pain   Yes Historical Provider, MD  polyethylene glycol (MIRALAX / GLYCOLAX) packet Take 17 g by mouth 2 (two) times daily. 08/31/11  Yes [name], MD  senna (SENOKOT) 8.6 MG TABS Take 2 tablets by mouth daily as needed. For constipation   Yes Historical Provider, MD  Sodium Fluoride (LISTERMINT MT) Use as directed 15 mLs in the mouth or throat 2 (two) times daily. Swish & spit   Yes Historical Provider, MD  traZODone (DESYREL) 100 MG tablet Take 100 mg by mouth at bedtime.   Yes Historical Provider, MD  traZODone (DESYREL) 100 MG tablet Take 100 mg by mouth at bedtime.   Yes Historical Provider, MD  venlafaxine (EFFEXOR) 100 MG tablet Take 100 mg by mouth 2 (two) times daily.   Yes Historical Provider, MD     Allergies  Allergen Reactions  . Latex Other (See Comments)    Reaction unknown     Social History:  reports that she has never smoked. She does not have any smokeless tobacco history on file. She reports that she does not drink alcohol or use illicit drugs.   History reviewed. No pertinent family history.    Physical Exam:  GEN:  46 year old Obese Quadriplegic African American Female examined  and in no acute distress; cooperative with exam Filed Vitals:   11/16/11 2145 11/16/11 2215 11/16/11 2251 11/16/11 2315  BP: 143/93 151/65  162/75  Pulse:  113 93   Temp:      TempSrc:      Resp: 23 21 18 16   SpO2:  100% 87%    Blood pressure 162/75, pulse 93, temperature 98.5 F (36.9 C), temperature source Rectal, resp. rate 16, SpO2 87.00%. PSYCH: SHe is alert and oriented x4; does not appear anxious does not appear depressed; affect is normal HEENT: Normocephalic and Atraumatic, Mucous membranes pink; PERRLA; EOM intact; Fundi:  Benign;  No scleral icterus, Nares: Patent, Oropharynx: Clear, Edentulous;  Neck:  FROM, no cervical lymphadenopathy nor thyromegaly or carotid bruit; no JVD; Breasts:: Not examined CHEST WALL: No tenderness CHEST: Normal respiration,  clear to auscultation bilaterally HEART: Regular rate and rhythm; no murmurs rubs or gallops BACK: No kyphosis or scoliosis; no CVA tenderness ABDOMEN: Positive Bowel Sounds, PEG Tube present Obese, soft non-tender; no masses, no organomegaly.   Rectal Exam: Not done EXTREMITIES: No cyanosis, clubbing or edema; no ulcerations. Genitalia: not examined PULSES: 2+ and symmetric SKIN: Normal hydration no rash.  Chronic Sacral Ulcer CNS: Cranial nerves 2-12 grossly intact,  +Quadriplegia.      Labs on Admission:  Basic Metabolic Panel:  Lab 11/16/11 1610  NA 138  K 4.2  CL 96  CO2 32  GLUCOSE 105*  BUN 43*  CREATININE 1.06  CALCIUM 9.7  MG --  PHOS --   Liver Function Tests:  Lab 11/16/11 2028  AST 14  ALT 10  ALKPHOS 65  BILITOT 0.2*  PROT 8.1  ALBUMIN 3.2*   No results found for this basename: LIPASE:5,AMYLASE:5 in the last 168 hours No results found for this basename: AMMONIA:5 in the last 168 hours CBC:  Lab 11/16/11 2028  WBC 8.4  NEUTROABS 4.7  HGB 10.4*  HCT 34.3*  MCV 77.1*  PLT 166   Cardiac Enzymes:  Lab 11/16/11 2028  CKTOTAL --  CKMB --  CKMBINDEX --  TROPONINI <0.30    BNP (last 3 results)  Basename 10/25/11 0500  PROBNP 108.3   CBG: No results found for this basename: GLUCAP:5 in the last 168 hours  Radiological Exams on Admission: Dg Chest Portable 1 View  11/16/2011  *RADIOLOGY REPORT*  Clinical Data: Chest pain.  Tachycardia.  PORTABLE CHEST - 1 VIEW  Comparison: 10/25/2011  Findings: Stable rightward rotation and levoconvex thoracic scoliosis noted.  Tracheostomy tube projects over the tracheal air shadow.  Left- sided Port-A-Cath noted with tip projecting over the SVC.  Mildly improved aeration noted at both lung bases, although air space opacities persist particularly on the left.  Cardiomegaly noted.  IMPRESSION:  1.  Improved aeration in the lung bases, although airspace opacities persist.   Original Report Authenticated By: Dellia Cloud, M.D.     EKG: Independently reviewed.   Assessment: Principal Problem:  *Healthcare-associated pneumonia Active Problems:  Acute respiratory failure with hypoxia  Hypoxemia  Tracheostomy in place  Neurogenic bladder with chronic Foley catheter  Quadriplegia  Schizophrenia  Anemia of chronic disease    Plan:   Admit to Stepdown Bed IV Vanc and Zosyn Nebs  Trach O2 IVFs Monitor Electrolytes DVT prophylaxis Continue Wound Care to Sacral Pressure Ulcer Decubitus Precautions   Code Status:  FULL CODE Family Communication:  Disposition Plan:  Return to SNF  Time spent: 60 minutes  Ron Parker [facility] Pager [phone]  If 7PM-7AM, please contact night-coverage [email or web address] Password [email or web address] 11/17/2011, 12:06 AM

## 2011-11-17 NOTE — Progress Notes (Signed)
UR completed 

## 2011-11-17 NOTE — Progress Notes (Addendum)
Pt c/o CP 10/10, stabbing, midsternal that radiates down her left arm.  Gave pt 1 mg dilaudid. BP 127/61, HR 98, SR/ST.  Paged Dr. Rito Ehrlich regarding situation.  Told him that I will obtain EKG.  MD agreed with current plan.  Will continue to assess.  EKG shows Sinus Tachycardia, no ST elevation noted.   Salomon Mast, RN

## 2011-11-17 NOTE — Plan of Care (Signed)
Problem: Phase II Progression Outcomes Goal: ADLs completed with minimal assistance Outcome: Not Met (add Reason) quadriplegic

## 2011-11-17 NOTE — Progress Notes (Signed)
ANTIBIOTIC CONSULT NOTE - INITIAL  Pharmacy Consult for vancomycin, Zosyn Indication: rule out pneumonia  Allergies  Allergen Reactions  . Latex Other (See Comments)    Reaction unknown    Patient Measurements: Height: 4' 8.3" (143 cm) Weight: 227 lb 1.2 oz (103 kg) IBW/kg (Calculated) : 36.99   Vital Signs: Temp: 97.8 F (36.6 C) (10/05 0129) Temp src: Oral (10/05 0129) BP: 120/64 mmHg (10/05 0129) Pulse Rate: 113  (10/05 0129) Intake/Output from previous day:   Intake/Output from this shift:    Labs:  [facility] 11/16/11 2028  WBC 8.4  HGB 10.4*  PLT 166  LABCREA --  CREATININE 1.06   Estimated Creatinine Clearance: 66.4 ml/min (by C-G formula based on Cr of 1.06). No results found for this basename: VANCOTROUGH:2,VANCOPEAK:2,VANCORANDOM:2,GENTTROUGH:2,GENTPEAK:2,GENTRANDOM:2,TOBRATROUGH:2,TOBRAPEAK:2,TOBRARND:2,AMIKACINPEAK:2,AMIKACINTROU:2,AMIKACIN:2, in the last 72 hours   Microbiology: Recent Results (from the past 720 hour(s))  MRSA PCR SCREENING     Status: Abnormal   Collection Time   10/20/11  3:14 AM      Component Value Range Status Comment   MRSA by PCR POSITIVE (*) NEGATIVE Final   URINE CULTURE     Status: Normal   Collection Time   10/20/11  3:25 AM      Component Value Range Status Comment   Specimen Description URINE, CATHETERIZED   Final    Special Requests NONE   Final    Culture  Setup Time     Final    Value: 10/20/2011 11:06     Two isolates with different morphologies were identified as the same organism.The most resistant organism was reported.   Colony Count >=100,000 COLONIES/ML   Final    Culture PSEUDOMONAS AERUGINOSA   Final    Report Status 10/24/2011 FINAL   Final    Organism ID, Bacteria PSEUDOMONAS AERUGINOSA   Final   CULTURE, BLOOD (ROUTINE X 2)     Status: Normal   Collection Time   10/20/11  3:50 AM      Component Value Range Status Comment   Specimen Description BLOOD LEFT ARM   Final    Special Requests BOTTLES DRAWN  AEROBIC ONLY 10CC   Final    Culture  Setup Time 10/20/2011 10:12   Final    Culture NO GROWTH 5 DAYS   Final    Report Status 10/26/2011 FINAL   Final   CULTURE, RESPIRATORY     Status: Normal   Collection Time   10/20/11  4:07 AM      Component Value Range Status Comment   Specimen Description SPUTUM   Final    Special Requests NONE   Final    Gram Stain     Final    Value: FEW WBC PRESENT,BOTH PMN AND MONONUCLEAR     NO SQUAMOUS EPITHELIAL CELLS SEEN     FEW GRAM POSITIVE COCCI IN PAIRS     IN CLUSTERS   Culture NORMAL OROPHARYNGEAL FLORA   Final    Report Status 10/22/2011 FINAL   Final   CULTURE, BLOOD (ROUTINE X 2)     Status: Normal   Collection Time   10/20/11  4:10 AM      Component Value Range Status Comment   Specimen Description BLOOD LEFT HAND   Final    Special Requests BOTTLES DRAWN AEROBIC ONLY 5CC   Final    Culture  Setup Time 10/20/2011 10:12   Final    Culture NO GROWTH 5 DAYS   Final    Report Status 10/26/2011 FINAL  Final   CULTURE, RESPIRATORY     Status: Normal   Collection Time   10/20/11  8:27 AM      Component Value Range Status Comment   Specimen Description TRACHEAL ASPIRATE   Final    Special Requests NONE   Final    Gram Stain     Final    Value: ABUNDANT WBC PRESENT,BOTH PMN AND MONONUCLEAR     RARE SQUAMOUS EPITHELIAL CELLS PRESENT     RARE GRAM POSITIVE COCCI     IN PAIRS RARE GRAM POSITIVE RODS   Culture Non-Pathogenic Oropharyngeal-type Flora Isolated.   Final    Report Status 10/24/2011 FINAL   Final   CULTURE, RESPIRATORY     Status: Normal   Collection Time   10/20/11  9:30 PM      Component Value Range Status Comment   Specimen Description TRACHEAL ASPIRATE   Final    Special Requests NONE   Final    Gram Stain     Final    Value: FEW WBC PRESENT, PREDOMINANTLY PMN     NO SQUAMOUS EPITHELIAL CELLS SEEN     RARE GRAM NEGATIVE RODS   Culture     Final    Value: FEW PSEUDOMONAS AERUGINOSA     Note: COLISTIN SENSITIVE  MIC=2ug/mL ETEST  results for this drug are "FOR INVESTIGATIONAL USE ONLY" and should NOT be used for clinical purposes.   Report Status 10/24/2011 FINAL   Final    Organism ID, Bacteria PSEUDOMONAS AERUGINOSA   Final     Medical History: Past Medical History  Diagnosis Date  . Respiratory failure   . Dysphagia   . Contracture of hand joint   . Anemia   . Neurogenic bladder   . Anxiety   . Schizophrenia   . Quadriplegia     Medications:  Prescriptions prior to admission  Medication Sig Dispense Refill  . acetaminophen (TYLENOL) 500 MG tablet Take 1,000 mg by mouth 2 (two) times daily as needed. For arthritic pain      . antiseptic oral rinse (BIOTENE) LIQD 15 mLs by Mouth Rinse route 2 (two) times daily.      Marland Kitchen aspirin 81 MG chewable tablet Chew 324 mg by mouth daily.      . calcium-vitamin D (OSCAL 500/200 D-3) 500-200 MG-UNIT per tablet Take 1 tablet by mouth every morning.      Marland Kitchen dextrose 5 % SOLN 50 mL with cefTAZidime 2 G SOLR 2 g Inject 2 g into the vein every 8 (eight) hours.      Marland Kitchen dextrose 5 % SOLN 50 mL with tobramycin 80 MG/2ML SOLN 140 mg Inject 140 mg into the vein daily.      Marland Kitchen docusate sodium (COLACE) 100 MG capsule Take 100 mg by mouth 2 (two) times daily.      . feeding supplement (PRO-STAT SUGAR FREE 64) LIQD Take 30 mLs by mouth 4 (four) times daily.  900 mL    . ferrous sulfate 325 (65 FE) MG tablet Take 325 mg by mouth daily with breakfast.      . fluticasone (FLONASE) 50 MCG/ACT nasal spray Place 1 spray into the nose daily.      . furosemide (LASIX) 20 MG tablet Take 20 mg by mouth daily.      Marland Kitchen ipratropium (ATROVENT) 0.02 % nebulizer solution Take 500 mcg by nebulization every 6 (six) hours. With albuterol      . levalbuterol (XOPENEX) 0.63 MG/3ML nebulizer solution Take 0.63  mg by nebulization every 6 (six) hours.      . metoprolol tartrate (LOPRESSOR) 25 MG tablet Take 12.5 mg by mouth 2 (two) times daily. Given at 1000 & 2200      . Multiple Vitamins-Minerals (CERTA-VITE  PO) Take 1 tablet by mouth every morning.       Marland Kitchen OLANZapine (ZYPREXA) 15 MG tablet Take 15 mg by mouth at bedtime.      Marland Kitchen omeprazole (PRILOSEC) 20 MG capsule Take 20 mg by mouth 2 (two) times daily.      . ondansetron (ZOFRAN) 4 MG tablet Take 4 mg by mouth every 6 (six) hours as needed. For nausea      . oxybutynin (DITROPAN) 5 MG tablet Take 5 mg by mouth 2 (two) times daily as needed. For bladder spasms      . oxycodone (OXY-IR) 5 MG capsule Take 10 mg by mouth every 6 (six) hours as needed. For pain      . polyethylene glycol (MIRALAX / GLYCOLAX) packet Take 17 g by mouth 2 (two) times daily.      Marland Kitchen senna (SENOKOT) 8.6 MG TABS Take 2 tablets by mouth daily as needed. For constipation      . Sodium Fluoride (LISTERMINT MT) Use as directed 15 mLs in the mouth or throat 2 (two) times daily. Swish & spit      . traZODone (DESYREL) 100 MG tablet Take 100 mg by mouth at bedtime.      . traZODone (DESYREL) 100 MG tablet Take 100 mg by mouth at bedtime.      Marland Kitchen venlafaxine (EFFEXOR) 100 MG tablet Take 100 mg by mouth 2 (two) times daily.       Scheduled:    . albuterol  2.5 mg Nebulization Q6H  . albuterol  5 mg Nebulization Once  . enoxaparin (LOVENOX) injection  40 mg Subcutaneous Daily  .  HYDROmorphone (DILAUDID) injection  1 mg Intravenous Once  . ipratropium  0.5 mg Nebulization Once  .  morphine injection  4 mg Intravenous Once  . ondansetron  4 mg Intravenous Once  . piperacillin-tazobactam (ZOSYN)  IV  3.375 g Intravenous Once  . sodium chloride  1,000 mL Intravenous Once  . vancomycin  1,000 mg Intravenous Once  . DISCONTD: sodium chloride   Intravenous STAT  . DISCONTD: piperacillin-tazobactam (ZOSYN)  IV  3.375 g Intravenous Once   Assessment: 46 yo female quadriplegic admitted with r/o pneumonia. Pharmacy consulted to manage vancomycin and Zosyn.   Patient was recently admitted (10/2011) with HCAP and received tobramycin / vancomycin / ceftazidime. Tobramycin levels were drawn  during previous hospitalization that indicated that drug clearance was ~ 20 - 25 mL/min (in comparison to calculated CrCl ~ 70 ml/min). This difference is reasonable given quadriplegia. As SrCr is similar this admit (1.06 mg/dL), will use estimated renal drug clearance of 20-25 mL/min.   Patient has already received 1 gm IV vancomycin and 3.375gm IV Zosyn.   Goal of Therapy:  Vancomycin trough level 15-20 mcg/ml  Plan:  1. Additional 1gm IV vancomycin x 1 for total 2gm loading dose. 2. Vancomycin 1gm IV Q48H. 3. Zosyn 3.375gm IV Q8H (4 hr infusion)  Thad Ranger, Mellody Drown 11/17/2011,1:34 AM

## 2011-11-17 NOTE — Progress Notes (Addendum)
   Patient admitted earlier today. H&P reviewed.  Patient complains of chest pain ongoing for 1 week. Its a sharp stabbing pain radiating to left arm. Increases with deep breathing.  VSS Lungs: Decreased air entry at bases. No wheezing Heart: S1S2 tachy reg. Abdomen: Soft, NT, PEG present Neuro: Quadriplegic Has Trach  1. Chest Pain: Pleuritic. Possibly from HCAP or pleurisy. Cycle Troponin. Pain control. EKG non ischemic.  2. HCAP: On vanc and zosyn. Swallow evaluation was done 9/12 and regular diet was recommended. Will proceed.  3. Sacral Decubes: Wound care, Air overlay.  4. Chronic Resp Failure with Trach  5. UTI: await Cultures.  Full Code  DVT proph with Enoxaparin   [name] 11/17/2011 10:21 AM

## 2011-11-18 LAB — BASIC METABOLIC PANEL
CO2: 29 mEq/L (ref 19–32)
GFR calc non Af Amer: 59 mL/min — ABNORMAL LOW (ref >90)
Glucose, Bld: 116 mg/dL — ABNORMAL HIGH (ref 70–99)
Potassium: 4 mEq/L (ref 3.5–5.1)
Sodium: 141 mEq/L (ref 135–145)

## 2011-11-18 LAB — CBC
Hemoglobin: 8.9 g/dL — ABNORMAL LOW (ref 12.0–15.0)
MCHC: 28.9 g/dL — ABNORMAL LOW (ref 30.0–36.0)
RBC: 3.86 MIL/uL — ABNORMAL LOW (ref 3.87–5.11)

## 2011-11-18 NOTE — Progress Notes (Signed)
[facility] PROGRESS NOTE  [name] OZH:[id number] DOB: [birth year] DOA: 11/16/2011 PCP: Terald Sleeper, MD  Assessment/Plan:  1. Hypoxia with increased tracheostomy secretions, presumed HCAP, but CT chest negative for distinct infiltrate, but bases with dense atelectasis from poor inspiratory effort and neuromuscular injury/quad, no fevers or leukocytosis, I question infection entirely. Having chest wall pain, CT negative for PE. *empiric vanc, zosyn *send respiratory cultures, if negative stop abx and observe *probably issues with mucous plugging and also sedation with pain medications or ? sleep apnea *getting IV dilaudid RTC-may need baseline chronic pain control  2. Chronic Respiratory Failure in setting of quadraplegia, Trach long term *respiratory managing tracheostomy needs *need to obtain BAL  3. Anemia *stable at baseline 8-9 *will monitor  4. Schizophrenia *stable *resumed home medications, Xyprexa  5. Neurogenic bladder, Chronic Foley *indwelling foley, stable  6. MRSA positive nasal swab *mupirocin  7. ?CHF, was on standing Lasix, she has mild renal insufficiency, will check BNP, if normal will stop Lasix, she had a poor quality 2D echo several months ago, inconclusive.  8. Allergic Rhinitis *Flonase resumed  9. Depression *venlafaxine and Trazadone resumed  Code Status: Full Code Family Communication: discussed with patient Disposition Plan: Back to SNF when medically stable, continue step down for trach needs and management   Antibiotics: Vancomycin 10/5 Zosyn 10/5  HPI/Subjective: C/o being "cold". Chest pain with deep breathing, "dilaudid helps".   Objective: Filed Vitals:   11/18/11 1017 11/18/11 1100 11/18/11 1221 11/18/11 1501  BP: 143/66 170/70    Pulse: 98 96 91   Temp:  98 F (36.7 C)    TempSrc:  Axillary    Resp:  11 10   Height:      Weight:      SpO2:  100% 99% 100%    Intake/Output Summary (Last 24 hours) at  11/18/11 1520 Last data filed at 11/18/11 1441  Gross per 24 hour  Intake   1550 ml  Output   2025 ml  Net   -475 ml   Filed Weights   11/16/11 2315 11/17/11 0129 11/18/11 0535  Weight: 107.7 kg (237 lb 7 oz) 103 kg (227 lb 1.2 oz) 105.5 kg (232 lb 9.4 oz)    Exam: GEN: 46 year old Obese Quadriplegic African American Female examinedplegic African American Female examined and in no acute distress; cooperative with exam  PSYCH: SHe is alert and oriented, depressed affect HEENT: Trach site and apparatus appears normal, clean and intact  CHEST: decreased BS, scattered rhonchi HEART: Regular rate and rhythm; no murmurs rubs or gallops  ABDOMEN: Positive Bowel Sounds, PEG Tube present Obese, soft non-tender; no masses, no organomegaly.  PULSES: 2+ and symmetric  SKIN: Normal hydration no rash. Chronic Sacral Ulcer  CNS:+Quadriplegia.   Data Reviewed: Basic Metabolic Panel:  Lab 11/18/11 6295 11/17/11 0400 11/16/11 2028  NA 141 141 138  K 4.0 4.0 4.2  CL 101 101 96  CO2 29 31 32  GLUCOSE 116* 109* 105*  BUN 41* 41* 43*  CREATININE 1.11* 1.09 1.06  CALCIUM 9.1 8.8 9.7  MG -- -- --  PHOS -- -- --   Liver Function Tests:  Lab 11/16/11 2028  AST 14  ALT 10  ALKPHOS 65  BILITOT 0.2*  PROT 8.1  ALBUMIN 3.2*   CBC:  Lab 11/18/11 0504 11/17/11 0400 11/16/11 2028  WBC 7.5 8.3 8.4  NEUTROABS -- -- 4.7  HGB 8.9* 9.4* 10.4*  HCT 30.8* 31.6* 34.3*  MCV 79.8 78.4 77.1*  PLT 132* 145* 166  Cardiac Enzymes:  Lab 11/17/11 1635 11/17/11 1005 11/16/11 2028  CKTOTAL -- -- --  CKMB -- -- --  CKMBINDEX -- -- --  TROPONINI <0.30 <0.30 <0.30   BNP (last 3 results)  Basename 10/25/11 0500  PROBNP 108.3   CBG: No results found for this basename: GLUCAP:5 in the last 168 hours  Recent Results (from the past 240 hour(s))  CULTURE, BLOOD (ROUTINE X 2)     Status: Normal (Preliminary result)   Collection Time   11/16/11  8:30 PM      Component Value Range Status Comment   Specimen Description BLOOD ARM RIGHT    Final    Special Requests BOTTLES DRAWN AEROBIC ONLY 10CC   Final    Culture  Setup Time 11/17/2011 00:54   Final    Culture     Final    Value:        BLOOD CULTURE RECEIVED NO GROWTH TO DATE CULTURE WILL BE HELD FOR 5 DAYS BEFORE ISSUING A FINAL NEGATIVE REPORT   Report Status PENDING   Incomplete   CULTURE, BLOOD (ROUTINE X 2)     Status: Normal (Preliminary result)   Collection Time   11/16/11  8:35 PM      Component Value Range Status Comment   Specimen Description BLOOD ARM RIGHT   Final    Special Requests BOTTLES DRAWN AEROBIC ONLY 5CC   Final    Culture  Setup Time 11/17/2011 00:54   Final    Culture     Final    Value:        BLOOD CULTURE RECEIVED NO GROWTH TO DATE CULTURE WILL BE HELD FOR 5 DAYS BEFORE ISSUING A FINAL NEGATIVE REPORT   Report Status PENDING   Incomplete   URINE CULTURE     Status: Normal (Preliminary result)   Collection Time   11/16/11  8:49 PM      Component Value Range Status Comment   Specimen Description URINE, CATHETERIZED   Final    Special Requests NONE   Final    Culture  Setup Time 11/16/2011 21:51   Final    Colony Count PENDING   Incomplete    Culture Culture reincubated for better growth   Final    Report Status PENDING   Incomplete   MRSA PCR SCREENING     Status: Abnormal   Collection Time   11/17/11  1:19 AM      Component Value Range Status Comment   MRSA by PCR POSITIVE (*) NEGATIVE Final      Studies: Ct Angio Chest Pe W/cm &/or Wo Cm  11/17/2011  *RADIOLOGY REPORT*  Clinical Data: Chest and abdominal pain.  Tracheostomy device.  CT ANGIOGRAPHY CHEST  Technique:  Multidetector CT imaging of the chest using the standard protocol during bolus administration of intravenous contrast. Multiplanar reconstructed images including MIPs were obtained and reviewed to evaluate the vascular anatomy.  Contrast: OMNIPAQUE IOHEXOL 350 MG/ML SOLN  Comparison: 08/28/2011  Findings: Tracheostomy device is well positioned. There is good contrast  opacification of the pulmonary artery branches.  No discrete filling defect to suggest acute PE.Adequate contrast opacification of the thoracic aorta with no evidence of dissection, aneurysm, or stenosis. There is classic 3-vessel brachiocephalic arch anatomy.  No pleural or pericardial effusion.  Left subclavian port catheter to the distal SVC.  Sub centimeter prevascular and precarinal lymph nodes.  No hilar adenopathy.  Dorsal epidural catheter remains in place.  Low lung volumes with crowding of  bronchovascular structures in the posterior left upper lobe, consolidation / atelectasis in the dependent aspect of both lower lobes, right worse than left.  Thoracic spine and sternum intact.  IMPRESSION:  1.  Negative for acute PE or thoracic aortic dissection. 2.  Low lung volumes with dependent atelectasis/consolidation posteriorly in both lower lobes.   Original Report Authenticated By: Osa Craver, M.D.    Dg Chest Portable 1 View  11/16/2011  *RADIOLOGY REPORT*  Clinical Data: Chest pain.  Tachycardia.  PORTABLE CHEST - 1 VIEW  Comparison: 10/25/2011  Findings: Stable rightward rotation and levoconvex thoracic scoliosis noted.  Tracheostomy tube projects over the tracheal air shadow.  Left- sided Port-A-Cath noted with tip projecting over the SVC.  Mildly improved aeration noted at both lung bases, although air space opacities persist particularly on the left.  Cardiomegaly noted.  IMPRESSION:  1.  Improved aeration in the lung bases, although airspace opacities persist.   Original Report Authenticated By: Dellia Cloud, M.D.     Scheduled Meds:   . antiseptic oral rinse  15 mL Mouth Rinse q12n4p  . aspirin  324 mg Oral Daily  . calcium-vitamin D  1 tablet Oral q morning - 10a  . chlorhexidine  15 mL Mouth Rinse BID  . Chlorhexidine Gluconate Cloth  6 each Topical Q0600  . docusate sodium  100 mg Oral BID  . enoxaparin (LOVENOX) injection  40 mg Subcutaneous Daily  . feeding  supplement  30 mL Oral QID  . ferrous sulfate  325 mg Oral Q breakfast  . fluticasone  1 spray Each Nare Daily  . furosemide  20 mg Oral Daily  . ipratropium  500 mcg Nebulization Q6H  . levalbuterol  0.63 mg Nebulization Q6H  . metoprolol tartrate  12.5 mg Oral BID  . mupirocin ointment  1 application Nasal BID  . OLANZapine  15 mg Oral QHS  . pantoprazole  40 mg Oral BID AC  . piperacillin-tazobactam (ZOSYN)  IV  3.375 g Intravenous Q8H  . polyethylene glycol  17 g Oral BID  . traZODone  100 mg Oral QHS  . vancomycin  1,000 mg Intravenous Q48H  . venlafaxine  100 mg Oral BID   Time spent: 35 minutes  [facility]  [facility] Pager [phone] If 8PM-8AM, please contact night-coverage at [email or web address], password [facility] 11/18/2011, 3:20 PM  LOS: 2 days

## 2011-11-19 LAB — CBC
MCH: 23.2 pg — ABNORMAL LOW (ref 26.0–34.0)
MCHC: 29.3 g/dL — ABNORMAL LOW (ref 30.0–36.0)
RDW: 13.3 % (ref 11.5–15.5)

## 2011-11-19 LAB — BASIC METABOLIC PANEL
Calcium: 9 mg/dL (ref 8.4–10.5)
GFR calc Af Amer: 70 mL/min — ABNORMAL LOW (ref >90)
GFR calc non Af Amer: 61 mL/min — ABNORMAL LOW (ref >90)
Potassium: 4.1 mEq/L (ref 3.5–5.1)
Sodium: 140 mEq/L (ref 135–145)

## 2011-11-19 MED ORDER — POLYVINYL ALCOHOL 1.4 % OP SOLN
1.0000 [drp] | OPHTHALMIC | Status: DC | PRN
  Filled 2011-11-19: qty 15

## 2011-11-19 NOTE — Progress Notes (Signed)
At bedside for feedings and freq requests,

## 2011-11-19 NOTE — Plan of Care (Signed)
Problem: Discharge Progression Outcomes Goal: Dyspnea controlled Outcome: Adequate for Discharge Dyspnea controlled with use of trach collar at 28%

## 2011-11-19 NOTE — Plan of Care (Signed)
Problem: Discharge Progression Outcomes Goal: Tolerating diet Outcome: Completed/Met Date Met:  11/19/11 tol diet

## 2011-11-19 NOTE — Progress Notes (Signed)
Trach care completed.tolerated well

## 2011-11-19 NOTE — Plan of Care (Signed)
Problem: Discharge Progression Outcomes Goal: Discharge plan in place and appropriate Outcome: Progressing For possible dc to [facility]

## 2011-11-19 NOTE — Progress Notes (Signed)
C/o not being able to breathe, 02 via trach collar at 28%. Fio2 100%. Resp,. Called and in with a breathing treatment. Resp. Rate 16 and non labored.

## 2011-11-19 NOTE — Plan of Care (Signed)
Problem: Discharge Progression Outcomes Goal: Able to self administer respiratory meds Outcome: Not Met (add Reason) Not able to self adm. meds due to quadraplegia.

## 2011-11-19 NOTE — Progress Notes (Signed)
[facility] PROGRESS NOTE  Rodella Loper ZOX:[id number] DOB: Jul 07, 1965 DOA: 11/16/2011 PCP: Terald Sleeper, MD  Assessment/Plan:  1. Hypoxia with increased tracheostomy secretions, presumed HCAP, but CT chest negative for distinct infiltrate, but bases with dense atelectasis from poor inspiratory effort and neuromuscular injury/quad, no fevers or leukocytosis, I question infection but on abx she seems to be improving with less secretions. Having chest wall pain-chronic, CT negative for PE. *empiric vanc, zosyn complete 8-10 day course for HCAP *send respiratory cultures, probably negative since obtained after abx started *probably issues with mucous plugging and also sedation with pain medications or ? sleep apnea, may need PM CPAP eval *she also has anxiety component with dyspnea when she falls asleep with her PM valve on *getting IV dilaudid for CW pain RTC-may need baseline chronic pain control *Will have [facility] do complete evaluation, may need LTAC transition before back to [facility]  2. Chronic Respiratory Failure in setting of quadraplegia, Trach long term *respiratory managing tracheostomy needs *sputum cx pending  3. Anemia *stable at baseline 8-9 *will monitor  4. Schizophrenia *stable *resumed home medications, Xyprexa  5. Neurogenic bladder, Chronic Foley *indwelling foley, stable  6. MRSA positive nasal swab *mupirocin  7. ?CHF, was on standing Lasix, she has mild renal insufficiency, will check BNP, if normal will stop Lasix, she had a poor quality 2D echo several months ago, inconclusive.  8. Allergic Rhinitis *Flonase resumed  9. Depression *venlafaxine and Trazadone resumed  Code Status: Full Code Family Communication: discussed with patient Disposition Plan: Possible LTAC then back to SNF when medically stable, continue step down for trach needs and management   Antibiotics: Vancomycin 10/5 Zosyn 10/5  HPI/Subjective: Chest pain  persists with deep breathing, "dilaudid helps". Increased anxiety, "afraid to go to sleep"  Objective: Filed Vitals:   11/19/11 0900 11/19/11 1125 11/19/11 1310 11/19/11 1500  BP:      Pulse:  86    Temp: 98 F (36.7 C)   97.8 F (36.6 C)  TempSrc: Oral   Oral  Resp:  10    Height:      Weight:      SpO2:  100% 100%     Intake/Output Summary (Last 24 hours) at 11/19/11 1530 Last data filed at 11/19/11 8119  Gross per 24 hour  Intake   1020 ml  Output   3275 ml  Net  -2255 ml   Filed Weights   11/17/11 0129 11/18/11 0535 11/19/11 0500  Weight: 103 kg (227 lb 1.2 oz) 105.5 kg (232 lb 9.4 oz) 106.5 kg (234 lb 12.6 oz)    Exam: GEN: 46 year old Obese Quadriplegic African American Female examined and in no acute distress; cooperative with exam  PSYCH: SHe is alert and oriented, depressed affect HEENT: Trach site and apparatus appears normal, clean and intact  CHEST: decreased BS, scattered rhonchi HEART: Regular rate and rhythm; no murmurs rubs or gallops  ABDOMEN: Positive Bowel Sounds, PEG Tube present Obese, soft non-tender; no masses, no organomegaly.  PULSES: 2+ and symmetric  SKIN: Normal hydration no rash. Chronic Sacral Ulcer  CNS:+Quadriplegia.   Data Reviewed: Basic Metabolic Panel:  Lab 11/19/11 1478 11/18/11 0504 11/17/11 0400 11/16/11 2028  NA 140 141 141 138  K 4.1 4.0 4.0 4.2  CL 102 101 101 96  CO2 31 29 31  32  GLUCOSE 98 116* 109* 105*  BUN 35* 41* 41* 43*  CREATININE 1.08 1.11* 1.09 1.06  CALCIUM 9.0 9.1 8.8 9.7  MG -- -- -- --  PHOS -- -- -- --   Liver Function Tests:  Lab 11/16/11 2028  AST 14  ALT 10  ALKPHOS 65  BILITOT 0.2*  PROT 8.1  ALBUMIN 3.2*   CBC:  Lab 11/19/11 0500 11/18/11 0504 11/17/11 0400 11/16/11 2028  WBC 7.4 7.5 8.3 8.4  NEUTROABS -- -- -- 4.7  HGB 8.8* 8.9* 9.4* 10.4*  HCT 30.0* 30.8* 31.6* 34.3*  MCV 78.9 79.8 78.4 77.1*  PLT 123* 132* 145* 166   Cardiac Enzymes:  Lab 11/17/11 1635 11/17/11 1005 11/16/11  2028  CKTOTAL -- -- --  CKMB -- -- --  CKMBINDEX -- -- --  TROPONINI <0.30 <0.30 <0.30   BNP (last 3 results)  Basename 10/25/11 0500  PROBNP 108.3   CBG: No results found for this basename: GLUCAP:5 in the last 168 hours  Recent Results (from the past 240 hour(s))  CULTURE, BLOOD (ROUTINE X 2)     Status: Normal (Preliminary result)   Collection Time   11/16/11  8:30 PM      Component Value Range Status Comment   Specimen Description BLOOD ARM RIGHT   Final    Special Requests BOTTLES DRAWN AEROBIC ONLY 10CC   Final    Culture  Setup Time 11/17/2011 00:54   Final    Culture     Final    Value:        BLOOD CULTURE RECEIVED NO GROWTH TO DATE CULTURE WILL BE HELD FOR 5 DAYS BEFORE ISSUING A FINAL NEGATIVE REPORT   Report Status PENDING   Incomplete   CULTURE, BLOOD (ROUTINE X 2)     Status: Normal (Preliminary result)   Collection Time   11/16/11  8:35 PM      Component Value Range Status Comment   Specimen Description BLOOD ARM RIGHT   Final    Special Requests BOTTLES DRAWN AEROBIC ONLY 5CC   Final    Culture  Setup Time 11/17/2011 00:54   Final    Culture     Final    Value:        BLOOD CULTURE RECEIVED NO GROWTH TO DATE CULTURE WILL BE HELD FOR 5 DAYS BEFORE ISSUING A FINAL NEGATIVE REPORT   Report Status PENDING   Incomplete   URINE CULTURE     Status: Normal (Preliminary result)   Collection Time   11/16/11  8:49 PM      Component Value Range Status Comment   Specimen Description URINE, CATHETERIZED   Final    Special Requests NONE   Final    Culture  Setup Time 11/16/2011 21:51   Final    Colony Count >=100,000 COLONIES/ML   Final    Culture GRAM NEGATIVE RODS   Final    Report Status PENDING   Incomplete   MRSA PCR SCREENING     Status: Abnormal   Collection Time   11/17/11  1:19 AM      Component Value Range Status Comment   MRSA by PCR POSITIVE (*) NEGATIVE Final      Studies: No results found.  Scheduled Meds:    . antiseptic oral rinse  15 mL Mouth  Rinse q12n4p  . aspirin  324 mg Oral Daily  . calcium-vitamin D  1 tablet Oral q morning - 10a  . chlorhexidine  15 mL Mouth Rinse BID  . Chlorhexidine Gluconate Cloth  6 each Topical Q0600  . docusate sodium  100 mg Oral BID  . enoxaparin (LOVENOX) injection  40 mg Subcutaneous Daily  .  feeding supplement  30 mL Oral QID  . ferrous sulfate  325 mg Oral Q breakfast  . fluticasone  1 spray Each Nare Daily  . furosemide  20 mg Oral Daily  . ipratropium  500 mcg Nebulization Q6H  . levalbuterol  0.63 mg Nebulization Q6H  . metoprolol tartrate  12.5 mg Oral BID  . mupirocin ointment  1 application Nasal BID  . OLANZapine  15 mg Oral QHS  . pantoprazole  40 mg Oral BID AC  . piperacillin-tazobactam (ZOSYN)  IV  3.375 g Intravenous Q8H  . polyethylene glycol  17 g Oral BID  . traZODone  100 mg Oral QHS  . vancomycin  1,000 mg Intravenous Q48H  . venlafaxine  100 mg Oral BID   Time spent: 35 minutes  [facility]  [facility] Pager [phone] If 8PM-8AM, please contact night-coverage at [email or web address], password [facility] 11/19/2011, 3:30 PM  LOS: 3 days

## 2011-11-19 NOTE — Progress Notes (Signed)
Assisted with breakfast. passy muir valve intact. FIO2 100%. Tol. Breakfast. Left pt. Watching tv and noted dosing off to sleep. Passy muir removed at patients request.

## 2011-11-20 ENCOUNTER — Inpatient Hospital Stay
Admission: RE | Admit: 2011-11-20 | Discharge: 2011-12-12 | Disposition: A | Discharge status: Skilled Nursing Facility | Payer: Self-pay | Source: Ambulatory Visit | Attending: Internal Medicine | Admitting: Internal Medicine

## 2011-11-20 DIAGNOSIS — A498 Other bacterial infections of unspecified site: Secondary | ICD-10-CM | POA: Diagnosis present

## 2011-11-20 DIAGNOSIS — D638 Anemia in other chronic diseases classified elsewhere: Secondary | ICD-10-CM

## 2011-11-20 LAB — BASIC METABOLIC PANEL
CO2: 32 mEq/L (ref 19–32)
Calcium: 9.1 mg/dL (ref 8.4–10.5)
Chloride: 101 mEq/L (ref 96–112)
Creatinine, Ser: 1.01 mg/dL (ref 0.50–1.10)
Glucose, Bld: 97 mg/dL (ref 70–99)
Sodium: 141 mEq/L (ref 135–145)

## 2011-11-20 LAB — CBC
HCT: 26.8 % — ABNORMAL LOW (ref 36.0–46.0)
MCH: 23.1 pg — ABNORMAL LOW (ref 26.0–34.0)
MCV: 79.3 fL (ref 78.0–100.0)
Platelets: 130 10*3/uL — ABNORMAL LOW (ref 150–400)
RBC: 3.38 MIL/uL — ABNORMAL LOW (ref 3.87–5.11)
WBC: 6.6 10*3/uL (ref 4.0–10.5)

## 2011-11-20 LAB — GLUCOSE, CAPILLARY: Glucose-Capillary: 90 mg/dL (ref 70–99)

## 2011-11-20 MED ORDER — DEXTROSE 5 % IV SOLN: 2.0000 g | Freq: Two times a day (BID) | INTRAVENOUS | Status: DC

## 2011-11-20 MED ORDER — MUPIROCIN 2 % EX OINT: 1.0000 "application " | TOPICAL_OINTMENT | Freq: Two times a day (BID) | CUTANEOUS | Status: AC

## 2011-11-20 MED ORDER — ENOXAPARIN SODIUM 40 MG/0.4ML ~~LOC~~ SOLN: 40.0000 mg | Freq: Every day | SUBCUTANEOUS | Status: DC

## 2011-11-20 MED ORDER — VANCOMYCIN HCL IN DEXTROSE 1-5 GM/200ML-% IV SOLN: 1000.0000 mg | INTRAVENOUS | Status: DC

## 2011-11-20 MED ORDER — PIPERACILLIN-TAZOBACTAM 3.375 G IVPB: 3.3750 g | Freq: Three times a day (TID) | INTRAVENOUS | Status: DC

## 2011-11-20 MED ORDER — CHLORHEXIDINE GLUCONATE CLOTH 2 % EX PADS: 6.0000 | MEDICATED_PAD | Freq: Every day | CUTANEOUS | Status: DC

## 2011-11-20 MED ORDER — DEXTROSE 5 % IV SOLN: 2.0000 g | INTRAVENOUS | Status: AC

## 2011-11-20 MED ORDER — TOBRAMYCIN SULFATE 80 MG/2ML IJ SOLN: 180.0000 mg | Freq: Once | INTRAVENOUS | Status: DC

## 2011-11-20 NOTE — Progress Notes (Signed)
ANTIBIOTIC CONSULT NOTE - FOLLOW UP  Pharmacy Consult for Vancomycin and Zosyn Indication: pneumonia  Allergies  Allergen Reactions  . Latex Other (See Comments)    Reaction unknown    Patient Measurements: Height: 4\' 8"  (142.2 cm) Weight: 234 lb 12.6 oz (106.5 kg) IBW/kg (Calculated) : 36.3   Vital Signs: Temp: 97.7 F (36.5 C) (10/08 0800) Temp src: Oral (10/08 0800) BP: 152/68 mmHg (10/08 0827) Pulse Rate: 80  (10/08 0827) Intake/Output from previous day: 10/07 0701 - 10/08 0700 In: 2365 [P.O.:2040; I.V.:115; NG/GT:60; IV Piggyback:150] Out: 4100 [Urine:4100] Intake/Output from this shift:    Labs:  Basename 11/20/11 0442 11/19/11 0500 11/18/11 0504  WBC 6.6 7.4 7.5  HGB 7.8* 8.8* 8.9*  PLT 130* 123* 132*  LABCREA -- -- --  CREATININE 1.01 1.08 1.11*   Estimated Creatinine Clearance: 70.8 ml/min (by C-G formula based on Cr of 1.01). No results found for this basename: VANCOTROUGH:2,VANCOPEAK:2,VANCORANDOM:2,GENTTROUGH:2,GENTPEAK:2,GENTRANDOM:2,TOBRATROUGH:2,TOBRAPEAK:2,TOBRARND:2,AMIKACINPEAK:2,AMIKACINTROU:2,AMIKACIN:2, in the last 72 hours   Microbiology: Recent Results (from the past 720 hour(s))  CULTURE, BLOOD (ROUTINE X 2)     Status: Normal (Preliminary result)   Collection Time   11/16/11  8:30 PM      Component Value Range Status Comment   Specimen Description BLOOD ARM RIGHT   Final    Special Requests BOTTLES DRAWN AEROBIC ONLY 10CC   Final    Culture  Setup Time 11/17/2011 00:54   Final    Culture     Final    Value:        BLOOD CULTURE RECEIVED NO GROWTH TO DATE CULTURE WILL BE HELD FOR 5 DAYS BEFORE ISSUING A FINAL NEGATIVE REPORT   Report Status PENDING   Incomplete   CULTURE, BLOOD (ROUTINE X 2)     Status: Normal (Preliminary result)   Collection Time   11/16/11  8:35 PM      Component Value Range Status Comment   Specimen Description BLOOD ARM RIGHT   Final    Special Requests BOTTLES DRAWN AEROBIC ONLY 5CC   Final    Culture  Setup Time  11/17/2011 00:54   Final    Culture     Final    Value:        BLOOD CULTURE RECEIVED NO GROWTH TO DATE CULTURE WILL BE HELD FOR 5 DAYS BEFORE ISSUING A FINAL NEGATIVE REPORT   Report Status PENDING   Incomplete   URINE CULTURE     Status: Normal (Preliminary result)   Collection Time   11/16/11  8:49 PM      Component Value Range Status Comment   Specimen Description URINE, CATHETERIZED   Final    Special Requests NONE   Final    Culture  Setup Time 11/16/2011 21:51   Final    Colony Count >=100,000 COLONIES/ML   Final    Culture GRAM NEGATIVE RODS   Final    Report Status PENDING   Incomplete   MRSA PCR SCREENING     Status: Abnormal   Collection Time   11/17/11  1:19 AM      Component Value Range Status Comment   MRSA by PCR POSITIVE (*) NEGATIVE Final   CULTURE, RESPIRATORY     Status: Normal (Preliminary result)   Collection Time   11/18/11  8:18 PM      Component Value Range Status Comment   Specimen Description TRACHEAL ASPIRATE   Final    Special Requests NONE   Final    Gram Stain  Final    Value: FEW WBC PRESENT, PREDOMINANTLY PMN     NO     SQUAMOUS EPITHELIAL CELLS PRESENT     RARE GRAM NEGATIVE RODS   Culture ABUNDANT GRAM NEGATIVE RODS   Final    Report Status PENDING   Incomplete     Anti-infectives     Start     Dose/Rate Route Frequency Ordered Stop   11/18/11 2000   vancomycin (VANCOCIN) IVPB 1000 mg/200 mL premix        1,000 mg 200 mL/hr over 60 Minutes Intravenous Every 48 hours 11/17/11 0142     11/17/11 0600  piperacillin-tazobactam (ZOSYN) IVPB 3.375 g       3.375 g 12.5 mL/hr over 240 Minutes Intravenous 3 times per day 11/17/11 0142     11/17/11 0230   vancomycin (VANCOCIN) IVPB 1000 mg/200 mL premix        1,000 mg 200 mL/hr over 60 Minutes Intravenous  Once 11/17/11 0142 11/17/11 0331   11/16/11 2230  piperacillin-tazobactam (ZOSYN) IVPB 3.375 g       3.375 g 12.5 mL/hr over 240 Minutes Intravenous  Once 11/16/11 2222 11/17/11 0258    11/16/11 2000   vancomycin (VANCOCIN) IVPB 1000 mg/200 mL premix        1,000 mg 200 mL/hr over 60 Minutes Intravenous  Once 11/16/11 1948 11/16/11 2247   11/16/11 2000   piperacillin-tazobactam (ZOSYN) IVPB 3.375 g  Status:  Discontinued        3.375 g 12.5 mL/hr over 240 Minutes Intravenous  Once 11/16/11 1948 11/16/11 2222          Assessment: HCAP, Urine Culture with GNR:  On Day #4 of empiric therapy with Vancomycin and Zosyn.  A SCr of ~1 in a quadriplegic patient is approximately 3x the expected SCr of ~0.3, and we have evidence from a recent admission in September that her clearance is ~20-25 ml/min.  Her antimicrobials are appropriate for this level of clearance.  Note she is growing Gram-negative rods in her Urine culture.  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  Goal of Therapy:  Vancomycin trough level 15-20 mcg/ml  Plan:  Continue Vancomycin 1gm IV q48  Continue Zosyn 3.375gm IV q8h extended infusion Follow-up urine culture results and need for antimicrobial coverage.  Estella Husk, Pharm.D., BCPS Clinical Pharmacist  Phone [phone] Pager [phone] 11/20/2011, 10:10 AM

## 2011-11-20 NOTE — Progress Notes (Signed)
At 0347 pt had approx 60 beat run of Vtach, self resolved, aymptomatic except for chronic CPmidsternal since admission.  Continues in SR rate 90's SBP 140s.  Tama Gander, NP notified of same.  Will continue to monitor

## 2011-11-20 NOTE — Progress Notes (Signed)
Report called to [facility], unit [id number], to nurse Verlon Au. Pt will be transferred via her bed and O2 by unit charge RN Janna Arch and RN Rolan Lipa.
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
## 2011-11-21 ENCOUNTER — Other Ambulatory Visit (HOSPITAL_COMMUNITY): Payer: Self-pay

## 2011-11-21 LAB — CULTURE, RESPIRATORY W GRAM STAIN

## 2011-11-21 LAB — CBC
HCT: 28.7 % — ABNORMAL LOW (ref 36.0–46.0)
Hemoglobin: 8.4 g/dL — ABNORMAL LOW (ref 12.0–15.0)
RDW: 12.9 % (ref 11.5–15.5)
WBC: 6.9 10*3/uL (ref 4.0–10.5)

## 2011-11-21 LAB — URINE CULTURE

## 2011-11-21 LAB — COMPREHENSIVE METABOLIC PANEL
ALT: 9 U/L (ref 0–35)
Albumin: 2.8 g/dL — ABNORMAL LOW (ref 3.5–5.2)
Alkaline Phosphatase: 51 U/L (ref 39–117)
BUN: 30 mg/dL — ABNORMAL HIGH (ref 6–23)
Chloride: 100 mEq/L (ref 96–112)
Glucose, Bld: 97 mg/dL (ref 70–99)
Potassium: 4 mEq/L (ref 3.5–5.1)
Sodium: 142 mEq/L (ref 135–145)
Total Bilirubin: 0.2 mg/dL — ABNORMAL LOW (ref 0.3–1.2)

## 2011-11-21 LAB — PHOSPHORUS: Phosphorus: 3.3 mg/dL (ref 2.3–4.6)

## 2011-11-21 LAB — MAGNESIUM: Magnesium: 2.3 mg/dL (ref 1.5–2.5)

## 2011-11-21 LAB — TSH: TSH: 0.398 u[IU]/mL (ref 0.350–4.500)

## 2011-11-23 ENCOUNTER — Other Ambulatory Visit (HOSPITAL_COMMUNITY): Payer: Self-pay

## 2011-11-23 LAB — CBC WITH DIFFERENTIAL/PLATELET
Basophils Absolute: 0 10*3/uL (ref 0.0–0.1)
Basophils Relative: 0 % (ref 0–1)
Eosinophils Absolute: 0.4 10*3/uL (ref 0.0–0.7)
Eosinophils Relative: 5 % (ref 0–5)
HCT: 27.8 % — ABNORMAL LOW (ref 36.0–46.0)
MCH: 23 pg — ABNORMAL LOW (ref 26.0–34.0)
MCHC: 29.5 g/dL — ABNORMAL LOW (ref 30.0–36.0)
MCV: 78.1 fL (ref 78.0–100.0)
Monocytes Absolute: 0.6 10*3/uL (ref 0.1–1.0)
Neutro Abs: 4.3 10*3/uL (ref 1.7–7.7)
RDW: 13 % (ref 11.5–15.5)

## 2011-11-23 LAB — CULTURE, BLOOD (ROUTINE X 2): Culture: NO GROWTH

## 2011-11-23 LAB — BASIC METABOLIC PANEL
Calcium: 9.3 mg/dL (ref 8.4–10.5)
Creatinine, Ser: 0.85 mg/dL (ref 0.50–1.10)
GFR calc Af Amer: >90 mL/min (ref >90)
GFR calc non Af Amer: 80 mL/min — ABNORMAL LOW (ref >90)

## 2011-11-23 LAB — TOBRAMYCIN LEVEL, RANDOM: Tobramycin Rm: 5.9 ug/mL

## 2011-11-26 LAB — BASIC METABOLIC PANEL
BUN: 26 mg/dL — ABNORMAL HIGH (ref 6–23)
Calcium: 9.7 mg/dL (ref 8.4–10.5)
Creatinine, Ser: 0.95 mg/dL (ref 0.50–1.10)
GFR calc Af Amer: 81 mL/min — ABNORMAL LOW (ref >90)

## 2011-11-26 LAB — URINE MICROSCOPIC-ADD ON

## 2011-11-26 LAB — CBC
HCT: 30.5 % — ABNORMAL LOW (ref 36.0–46.0)
MCH: 22.8 pg — ABNORMAL LOW (ref 26.0–34.0)
MCV: 77.4 fL — ABNORMAL LOW (ref 78.0–100.0)
Platelets: 156 10*3/uL (ref 150–400)
RDW: 13.2 % (ref 11.5–15.5)

## 2011-11-26 LAB — URINALYSIS, ROUTINE W REFLEX MICROSCOPIC
Bilirubin Urine: NEGATIVE
Glucose, UA: NEGATIVE mg/dL
Ketones, ur: NEGATIVE mg/dL
Protein, ur: 100 mg/dL — AB

## 2011-11-27 ENCOUNTER — Other Ambulatory Visit (HOSPITAL_COMMUNITY): Payer: Self-pay

## 2011-11-27 LAB — COMPREHENSIVE METABOLIC PANEL
BUN: 29 mg/dL — ABNORMAL HIGH (ref 6–23)
CO2: 34 mEq/L — ABNORMAL HIGH (ref 19–32)
Calcium: 9.2 mg/dL (ref 8.4–10.5)
Creatinine, Ser: 0.92 mg/dL (ref 0.50–1.10)
GFR calc Af Amer: 85 mL/min — ABNORMAL LOW (ref >90)
GFR calc non Af Amer: 73 mL/min — ABNORMAL LOW (ref >90)
Glucose, Bld: 90 mg/dL (ref 70–99)
Sodium: 142 mEq/L (ref 135–145)
Total Protein: 7.4 g/dL (ref 6.0–8.3)

## 2011-11-27 LAB — CBC
HCT: 30.4 % — ABNORMAL LOW (ref 36.0–46.0)
Hemoglobin: 8.8 g/dL — ABNORMAL LOW (ref 12.0–15.0)
MCH: 22.7 pg — ABNORMAL LOW (ref 26.0–34.0)
MCHC: 28.9 g/dL — ABNORMAL LOW (ref 30.0–36.0)
MCV: 78.4 fL (ref 78.0–100.0)
RBC: 3.88 MIL/uL (ref 3.87–5.11)

## 2011-11-27 LAB — URINE CULTURE: Colony Count: >=100000

## 2011-11-28 LAB — BASIC METABOLIC PANEL
BUN: 33 mg/dL — ABNORMAL HIGH (ref 6–23)
Calcium: 9 mg/dL (ref 8.4–10.5)
GFR calc non Af Amer: 71 mL/min — ABNORMAL LOW (ref >90)
Glucose, Bld: 118 mg/dL — ABNORMAL HIGH (ref 70–99)

## 2011-12-01 LAB — CBC WITH DIFFERENTIAL/PLATELET
Eosinophils Absolute: 0.3 10*3/uL (ref 0.0–0.7)
Eosinophils Relative: 3 % (ref 0–5)
Lymphs Abs: 3.5 10*3/uL (ref 0.7–4.0)
MCH: 23.3 pg — ABNORMAL LOW (ref 26.0–34.0)
MCV: 77.2 fL — ABNORMAL LOW (ref 78.0–100.0)
Monocytes Absolute: 0.7 10*3/uL (ref 0.1–1.0)
Platelets: 184 10*3/uL (ref 150–400)
RBC: 3.86 MIL/uL — ABNORMAL LOW (ref 3.87–5.11)
RDW: 13.5 % (ref 11.5–15.5)

## 2011-12-01 LAB — BASIC METABOLIC PANEL
Calcium: 9.7 mg/dL (ref 8.4–10.5)
Creatinine, Ser: 0.86 mg/dL (ref 0.50–1.10)
GFR calc non Af Amer: 79 mL/min — ABNORMAL LOW (ref >90)
Glucose, Bld: 114 mg/dL — ABNORMAL HIGH (ref 70–99)
Sodium: 143 mEq/L (ref 135–145)

## 2011-12-03 ENCOUNTER — Other Ambulatory Visit (HOSPITAL_COMMUNITY): Payer: Self-pay

## 2011-12-03 LAB — BASIC METABOLIC PANEL
BUN: 58 mg/dL — ABNORMAL HIGH (ref 6–23)
Calcium: 9.7 mg/dL (ref 8.4–10.5)
Chloride: 101 mEq/L (ref 96–112)
Creatinine, Ser: 1.05 mg/dL (ref 0.50–1.10)
GFR calc Af Amer: 72 mL/min — ABNORMAL LOW (ref >90)
GFR calc non Af Amer: 62 mL/min — ABNORMAL LOW (ref >90)

## 2011-12-03 LAB — CBC
MCH: 22.7 pg — ABNORMAL LOW (ref 26.0–34.0)
MCHC: 29.8 g/dL — ABNORMAL LOW (ref 30.0–36.0)
MCV: 76.3 fL — ABNORMAL LOW (ref 78.0–100.0)
Platelets: 199 10*3/uL (ref 150–400)
RDW: 13.4 % (ref 11.5–15.5)
WBC: 9.3 10*3/uL (ref 4.0–10.5)

## 2011-12-04 LAB — BASIC METABOLIC PANEL
Calcium: 9.5 mg/dL (ref 8.4–10.5)
Creatinine, Ser: 1.11 mg/dL — ABNORMAL HIGH (ref 0.50–1.10)
GFR calc Af Amer: 67 mL/min — ABNORMAL LOW (ref >90)
GFR calc non Af Amer: 58 mL/min — ABNORMAL LOW (ref >90)
Sodium: 139 mEq/L (ref 135–145)

## 2011-12-05 LAB — BASIC METABOLIC PANEL
Calcium: 8.9 mg/dL (ref 8.4–10.5)
GFR calc Af Amer: 84 mL/min — ABNORMAL LOW (ref >90)
GFR calc non Af Amer: 72 mL/min — ABNORMAL LOW (ref >90)
Potassium: 3.9 mEq/L (ref 3.5–5.1)
Sodium: 132 mEq/L — ABNORMAL LOW (ref 135–145)

## 2011-12-06 ENCOUNTER — Other Ambulatory Visit (HOSPITAL_COMMUNITY): Payer: Self-pay

## 2011-12-06 LAB — URINE MICROSCOPIC-ADD ON

## 2011-12-06 LAB — URINALYSIS, ROUTINE W REFLEX MICROSCOPIC
Bilirubin Urine: NEGATIVE
Nitrite: NEGATIVE
Specific Gravity, Urine: 1.01 (ref 1.005–1.030)
Urobilinogen, UA: 0.2 mg/dL (ref 0.0–1.0)
pH: 7 (ref 5.0–8.0)

## 2011-12-06 LAB — BASIC METABOLIC PANEL
BUN: 71 mg/dL — ABNORMAL HIGH (ref 6–23)
Chloride: 104 mEq/L (ref 96–112)
Creatinine, Ser: 0.95 mg/dL (ref 0.50–1.10)
GFR calc Af Amer: 81 mL/min — ABNORMAL LOW (ref >90)
GFR calc non Af Amer: 70 mL/min — ABNORMAL LOW (ref >90)
Potassium: 4.3 mEq/L (ref 3.5–5.1)

## 2011-12-07 LAB — CBC WITH DIFFERENTIAL/PLATELET
Eosinophils Relative: 3 % (ref 0–5)
HCT: 29.9 % — ABNORMAL LOW (ref 36.0–46.0)
Hemoglobin: 9.2 g/dL — ABNORMAL LOW (ref 12.0–15.0)
Lymphocytes Relative: 35 % (ref 12–46)
MCHC: 30.8 g/dL (ref 30.0–36.0)
MCV: 75.1 fL — ABNORMAL LOW (ref 78.0–100.0)
Monocytes Absolute: 0.8 10*3/uL (ref 0.1–1.0)
Monocytes Relative: 7 % (ref 3–12)
Neutro Abs: 6 10*3/uL (ref 1.7–7.7)
WBC: 11 10*3/uL — ABNORMAL HIGH (ref 4.0–10.5)

## 2011-12-07 LAB — URINE CULTURE

## 2011-12-07 LAB — BASIC METABOLIC PANEL
BUN: 70 mg/dL — ABNORMAL HIGH (ref 6–23)
CO2: 24 mEq/L (ref 19–32)
Calcium: 9.5 mg/dL (ref 8.4–10.5)
Chloride: 100 mEq/L (ref 96–112)
Creatinine, Ser: 0.91 mg/dL (ref 0.50–1.10)

## 2011-12-08 ENCOUNTER — Other Ambulatory Visit (HOSPITAL_COMMUNITY): Payer: Self-pay

## 2011-12-08 LAB — CBC WITH DIFFERENTIAL/PLATELET
Basophils Relative: 0 % (ref 0–1)
Hemoglobin: 9.3 g/dL — ABNORMAL LOW (ref 12.0–15.0)
Lymphocytes Relative: 36 % (ref 12–46)
Lymphs Abs: 3.6 10*3/uL (ref 0.7–4.0)
MCHC: 31 g/dL (ref 30.0–36.0)
Monocytes Relative: 7 % (ref 3–12)
Neutro Abs: 5.3 10*3/uL (ref 1.7–7.7)
Neutrophils Relative %: 53 % (ref 43–77)
RBC: 3.99 MIL/uL (ref 3.87–5.11)
WBC: 10.1 10*3/uL (ref 4.0–10.5)

## 2011-12-08 LAB — BASIC METABOLIC PANEL
BUN: 78 mg/dL — ABNORMAL HIGH (ref 6–23)
CO2: 27 mEq/L (ref 19–32)
Chloride: 100 mEq/L (ref 96–112)
GFR calc Af Amer: 78 mL/min — ABNORMAL LOW (ref >90)
Potassium: 4.5 mEq/L (ref 3.5–5.1)

## 2011-12-09 LAB — CBC
Hemoglobin: 9.2 g/dL — ABNORMAL LOW (ref 12.0–15.0)
MCH: 23.2 pg — ABNORMAL LOW (ref 26.0–34.0)
RBC: 3.97 MIL/uL (ref 3.87–5.11)

## 2011-12-11 ENCOUNTER — Other Ambulatory Visit (HOSPITAL_COMMUNITY): Payer: Self-pay

## 2011-12-11 LAB — CBC WITH DIFFERENTIAL/PLATELET
Basophils Relative: 0 % (ref 0–1)
HCT: 30.2 % — ABNORMAL LOW (ref 36.0–46.0)
Hemoglobin: 9 g/dL — ABNORMAL LOW (ref 12.0–15.0)
Lymphocytes Relative: 33 % (ref 12–46)
MCHC: 29.8 g/dL — ABNORMAL LOW (ref 30.0–36.0)
Monocytes Relative: 7 % (ref 3–12)
Neutro Abs: 5.7 10*3/uL (ref 1.7–7.7)
WBC: 10.1 10*3/uL (ref 4.0–10.5)

## 2011-12-11 LAB — BASIC METABOLIC PANEL
BUN: 56 mg/dL — ABNORMAL HIGH (ref 6–23)
CO2: 24 mEq/L (ref 19–32)
Chloride: 104 mEq/L (ref 96–112)
Glucose, Bld: 147 mg/dL — ABNORMAL HIGH (ref 70–99)
Potassium: 5 mEq/L (ref 3.5–5.1)

## 2011-12-11 LAB — CLOSTRIDIUM DIFFICILE BY PCR: Toxigenic C. Difficile by PCR: POSITIVE — AB

## 2011-12-12 LAB — BASIC METABOLIC PANEL
BUN: 55 mg/dL — ABNORMAL HIGH (ref 6–23)
Chloride: 105 mEq/L (ref 96–112)
GFR calc Af Amer: 78 mL/min — ABNORMAL LOW (ref >90)
Glucose, Bld: 106 mg/dL — ABNORMAL HIGH (ref 70–99)
Potassium: 5 mEq/L (ref 3.5–5.1)
Sodium: 140 mEq/L (ref 135–145)

## 2011-12-13 LAB — OVA AND PARASITE EXAMINATION: Ova and parasites: NONE SEEN

## 2011-12-19 ENCOUNTER — Ambulatory Visit (HOSPITAL_COMMUNITY)
Admission: RE | Admit: 2011-12-19 | Discharge: 2011-12-19 | Disposition: A | Discharge status: Home / Self Care | Payer: Medicare Other | Source: Ambulatory Visit | Attending: Internal Medicine | Admitting: Internal Medicine

## 2011-12-19 DIAGNOSIS — Z930 Tracheostomy status: Secondary | ICD-10-CM | POA: Insufficient documentation

## 2011-12-19 DIAGNOSIS — J189 Pneumonia, unspecified organism: Secondary | ICD-10-CM | POA: Insufficient documentation

## 2011-12-19 DIAGNOSIS — D638 Anemia in other chronic diseases classified elsewhere: Secondary | ICD-10-CM | POA: Insufficient documentation

## 2011-12-19 DIAGNOSIS — G8250 Quadriplegia, unspecified: Secondary | ICD-10-CM | POA: Insufficient documentation

## 2011-12-19 DIAGNOSIS — N319 Neuromuscular dysfunction of bladder, unspecified: Secondary | ICD-10-CM | POA: Insufficient documentation

## 2011-12-19 DIAGNOSIS — R1313 Dysphagia, pharyngeal phase: Secondary | ICD-10-CM | POA: Insufficient documentation

## 2011-12-19 DIAGNOSIS — J9610 Chronic respiratory failure, unspecified whether with hypoxia or hypercapnia: Secondary | ICD-10-CM | POA: Insufficient documentation

## 2011-12-19 DIAGNOSIS — F411 Generalized anxiety disorder: Secondary | ICD-10-CM | POA: Insufficient documentation

## 2011-12-19 DIAGNOSIS — J9600 Acute respiratory failure, unspecified whether with hypoxia or hypercapnia: Secondary | ICD-10-CM | POA: Insufficient documentation

## 2011-12-19 DIAGNOSIS — F209 Schizophrenia, unspecified: Secondary | ICD-10-CM | POA: Insufficient documentation

## 2011-12-19 DIAGNOSIS — B965 Pseudomonas (aeruginosa) (mallei) (pseudomallei) as the cause of diseases classified elsewhere: Secondary | ICD-10-CM | POA: Insufficient documentation

## 2011-12-19 DIAGNOSIS — R0902 Hypoxemia: Secondary | ICD-10-CM | POA: Insufficient documentation

## 2011-12-19 NOTE — Procedures (Signed)
Objective Swallowing Evaluation: Fiberoptic Endoscopic Evaluation of Swallowing  Patient Details  Name: [name] MRN: [id number] Date of Birth: [birth year]  Today's Date: 12/19/2011 Time: 1050-1120 SLP Time Calculation (min): 30 min  Past Medical History:  Past Medical History  Diagnosis Date  . Respiratory failure   . Dysphagia   . Contracture of hand joint   . Anemia   . Neurogenic bladder   . Anxiety   . Schizophrenia   . Quadriplegia    Past Surgical History:  Past Surgical History  Procedure Date  . Tracheostomy   . Gastrostomy w/ feeding tube    HPI:  46 year old female with PMH of quadraplegia, chronic respiratory failure s/p tracheostomy, schizophrenia, anxiety, depression, anemia, chronic pain, restless leg syndrome, recurrent UTI. Patient recently made NPO due to recurrent PNA for which MD wishes to r/o silent aspiration.      Assessment / Plan / Recommendation Clinical Impression  Dysphagia Diagnosis: Mild pharyngeal phase dysphagia Clinical impression: Patient presents with a functional and safe oropharyngeal swallow. Delayed swallow initiation noted, ? due to decreased respiratory status resulting in decreased coordination of breath and swallow, however patient able to fully protect her airway with no penetration of aspiration observed. Recommend resuming a regular diet, thin liquids with general safe swallowing precautions to decrease risk including best positioning possible.     Treatment Recommendation  No treatment recommended at this time    Diet Recommendation Regular;Thin liquid   Liquid Administration via: Cup;Straw Medication Administration: Whole meds with liquid (one at a time) Compensations: Slow rate;Small sips/bites Postural Changes and/or Swallow Maneuvers: Seated upright 90 degrees    Other  Recommendations Oral Care Recommendations: Oral care BID   Follow Up Recommendations  None           SLP Swallow Goals     General HPI: 46  year old female with PMH of quadraplegia, chronic respiratory failure s/p tracheostomy, schizophrenia, anxiety, depression, anemia, chronic pain, restless leg syndrome, recurrent UTI. Patient recently made NPO due to recurrent PNA for which MD wishes to r/o silent aspiration.  Type of Study: Fiberoptic Endoscopic Evaluation of Swallowing Reason for Referral: Objectively evaluate swallowing function Previous Swallow Assessment: cancelled MBSS noted in chart. Otherwise, bedside swallow evaluation complete 10/25/11 during [facility] admission indicated a normal swallow with recommendations for a regular diet.  Diet Prior to this Study: NPO;PEG tube Temperature Spikes Noted: No Respiratory Status: Room air (trach, with PMSV in place) History of Recent Intubation: No Behavior/Cognition: Alert;Cooperative;Pleasant mood Oral Cavity - Dentition: Poor condition;Missing dentition Oral Motor / Sensory Function: Within functional limits Self-Feeding Abilities: Total assist Patient Positioning: Upright in bed Baseline Vocal Quality: Hoarse Volitional Cough: Weak (decreased adduction of vocal cords noted on FEES) Volitional Swallow: Able to elicit Anatomy: Other (Comment) (edemanous appearing) Pharyngeal Secretions:  (mild thin through out laryngeal vestibule and pharynx)    Reason for Referral Objectively evaluate swallowing function   Oral Phase Oral Preparation/Oral Phase Oral Phase: WFL   Pharyngeal Phase Pharyngeal Phase Pharyngeal Phase: Impaired Pharyngeal - Thin Pharyngeal - Thin Straw: Delayed swallow initiation;Premature spillage to valleculae Pharyngeal - Solids Pharyngeal - Puree: Delayed swallow initiation;Premature spillage to valleculae Pharyngeal - Mechanical Soft: Delayed swallow initiation;Premature spillage to valleculae  Cervical Esophageal Phase    GO    Cervical Esophageal Phase Cervical Esophageal Phase:  (no significant findings)    Functional Assessment Tool Used: skilled  clinical observation Functional Limitations: Swallowing Swallow Current Status ([id number]): At least 1 percent but less than 20  percent impaired, limited or restricted Swallow Goal Status [phone]): At least 1 percent but less than 20 percent impaired, limited or restricted Swallow Discharge Status [phone]): At least 1 percent but less than 20 percent impaired, limited or restricted   [facility] MA, CCC-SLP [phone]  [name] 12/19/2011, 11:44 AM

## 2011-12-27 ENCOUNTER — Ambulatory Visit (HOSPITAL_COMMUNITY)
Admission: RE | Admit: 2011-12-27 | Discharge: 2011-12-27 | Disposition: A | Discharge status: Home / Self Care | Payer: Medicare Other | Source: Ambulatory Visit | Attending: Internal Medicine | Admitting: Internal Medicine

## 2011-12-27 ENCOUNTER — Other Ambulatory Visit (HOSPITAL_COMMUNITY): Payer: Self-pay | Admitting: *Deleted

## 2011-12-27 DIAGNOSIS — R609 Edema, unspecified: Secondary | ICD-10-CM

## 2011-12-27 DIAGNOSIS — R52 Pain, unspecified: Secondary | ICD-10-CM

## 2011-12-27 DIAGNOSIS — M79609 Pain in unspecified limb: Secondary | ICD-10-CM | POA: Insufficient documentation

## 2011-12-27 DIAGNOSIS — M7989 Other specified soft tissue disorders: Secondary | ICD-10-CM | POA: Insufficient documentation

## 2011-12-27 DIAGNOSIS — G8250 Quadriplegia, unspecified: Secondary | ICD-10-CM | POA: Insufficient documentation

## 2011-12-27 NOTE — Progress Notes (Signed)
*  Preliminary Results* Left lower extremity venous duplex completed. There is no obvious evidence of left lower extremity deep vein thrombosis or left Baker's cyst. Unable to visualize the distal left femoral, left posterior tibial, and left peroneal veins, therefore unable to exclude deep vein thrombosis in these segments.  12/27/2011 3:32 PM Gertie Fey, RDMS, RDCS

## 2012-03-08 ENCOUNTER — Inpatient Hospital Stay (HOSPITAL_COMMUNITY)
Admission: EM | Admit: 2012-03-08 | Discharge: 2012-03-14 | DRG: 177 | Disposition: A | Discharge status: Skilled Nursing Facility | Payer: Medicare Other | Attending: Internal Medicine | Admitting: Internal Medicine

## 2012-03-08 ENCOUNTER — Emergency Department (HOSPITAL_COMMUNITY): Payer: Medicare Other

## 2012-03-08 DIAGNOSIS — Z7982 Long term (current) use of aspirin: Secondary | ICD-10-CM

## 2012-03-08 DIAGNOSIS — J189 Pneumonia, unspecified organism: Secondary | ICD-10-CM

## 2012-03-08 DIAGNOSIS — Z79899 Other long term (current) drug therapy: Secondary | ICD-10-CM

## 2012-03-08 DIAGNOSIS — F419 Anxiety disorder, unspecified: Secondary | ICD-10-CM

## 2012-03-08 DIAGNOSIS — J9601 Acute respiratory failure with hypoxia: Secondary | ICD-10-CM | POA: Diagnosis present

## 2012-03-08 DIAGNOSIS — F209 Schizophrenia, unspecified: Secondary | ICD-10-CM

## 2012-03-08 DIAGNOSIS — N319 Neuromuscular dysfunction of bladder, unspecified: Secondary | ICD-10-CM

## 2012-03-08 DIAGNOSIS — F411 Generalized anxiety disorder: Secondary | ICD-10-CM | POA: Diagnosis present

## 2012-03-08 DIAGNOSIS — Z930 Tracheostomy status: Secondary | ICD-10-CM

## 2012-03-08 DIAGNOSIS — B965 Pseudomonas (aeruginosa) (mallei) (pseudomallei) as the cause of diseases classified elsewhere: Secondary | ICD-10-CM | POA: Diagnosis present

## 2012-03-08 DIAGNOSIS — J9620 Acute and chronic respiratory failure, unspecified whether with hypoxia or hypercapnia: Secondary | ICD-10-CM | POA: Diagnosis present

## 2012-03-08 DIAGNOSIS — D638 Anemia in other chronic diseases classified elsewhere: Secondary | ICD-10-CM

## 2012-03-08 DIAGNOSIS — N390 Urinary tract infection, site not specified: Secondary | ICD-10-CM

## 2012-03-08 DIAGNOSIS — J690 Pneumonitis due to inhalation of food and vomit: Principal | ICD-10-CM | POA: Diagnosis present

## 2012-03-08 DIAGNOSIS — G8250 Quadriplegia, unspecified: Secondary | ICD-10-CM

## 2012-03-08 DIAGNOSIS — R0902 Hypoxemia: Secondary | ICD-10-CM

## 2012-03-08 DIAGNOSIS — R0789 Other chest pain: Secondary | ICD-10-CM | POA: Diagnosis present

## 2012-03-08 DIAGNOSIS — Z931 Gastrostomy status: Secondary | ICD-10-CM

## 2012-03-08 DIAGNOSIS — F329 Major depressive disorder, single episode, unspecified: Secondary | ICD-10-CM | POA: Diagnosis present

## 2012-03-08 DIAGNOSIS — A498 Other bacterial infections of unspecified site: Secondary | ICD-10-CM

## 2012-03-08 DIAGNOSIS — F3289 Other specified depressive episodes: Secondary | ICD-10-CM | POA: Diagnosis present

## 2012-03-08 HISTORY — DX: Essential (primary) hypertension: I10

## 2012-03-08 HISTORY — DX: Pneumonia, unspecified organism: J18.9

## 2012-03-08 MED ORDER — FENTANYL CITRATE 0.05 MG/ML IJ SOLN
50.0000 ug | Freq: Once | INTRAMUSCULAR | Status: AC
  Administered 2012-03-09: 50 ug via INTRAVENOUS
  Filled 2012-03-08: qty 2

## 2012-03-08 NOTE — ED Provider Notes (Signed)
History     CSN: [id number]  Arrival date & time 03/08/12  2204   First MD Initiated Contact with Patient 03/08/12 2258      Chief Complaint  Patient presents with  . Pneumonia     Patient is a 46 y.o. female presenting with pneumonia. The history is provided by the patient.  Pneumonia This is a new problem. Episode onset: today. The problem occurs constantly. The problem has not changed since onset.Associated symptoms include chest pain, abdominal pain and shortness of breath. Nothing aggravates the symptoms. Nothing relieves the symptoms.  pt presents from nursing facility for presumed pneumonia Pt is quadriplegic and has trach in place.  She reports increased SOB and CP as well.  She was febrile at nursing facility.  Pt tells me "I have food in my lungs" She also reports recent "kidney surgery" and had bilateral stents placed to each kidney.  She is unable to provide any further detail on this subject.  She reports the CP is worse with deep breathing.    Past Medical History  Diagnosis Date  . Respiratory failure   . Dysphagia   . Contracture of hand joint   . Anemia   . Neurogenic bladder   . Anxiety   . Schizophrenia   . Quadriplegia     Past Surgical History  Procedure Date  . Tracheostomy   . Gastrostomy w/ feeding tube     No family history on file.  History  Substance Use Topics  . Smoking status: Former Smoker -- 2.0 packs/day    Types: Cigarettes    Quit date: 11/17/2006  . Smokeless tobacco: Not on file  . Alcohol Use: 3.6 oz/week    6 Cans of beer per week    OB History    Grav Para Term Preterm Abortions TAB SAB Ect Mult Living                  Review of Systems  Constitutional: Positive for fever.  Respiratory: Positive for shortness of breath.   Cardiovascular: Positive for chest pain.  Gastrointestinal: Positive for abdominal pain.  Neurological: Negative for weakness.  Psychiatric/Behavioral: Negative for agitation.  All other systems  reviewed and are negative.    Allergies  Latex  Home Medications   Current Outpatient Rx  Name  Route  Sig  Dispense  Refill  . ACETAMINOPHEN 500 MG PO TABS   Oral   Take 1,000 mg by mouth 2 (two) times daily as needed. For arthritic pain         . BIOTENE DRY MOUTH MT LIQD   Mouth Rinse   15 mLs by Mouth Rinse route 2 (two) times daily.         . ASPIRIN 81 MG PO CHEW   Oral   Chew 324 mg by mouth daily.         Marland Kitchen CALCIUM CARBONATE-VITAMIN D 500-200 MG-UNIT PO TABS   Oral   Take 1 tablet by mouth every morning.         Marland Kitchen CHLORHEXIDINE GLUCONATE CLOTH 2 % EX PADS   Topical   Apply 6 each topically daily at 6 (six) AM.   5 each   0   . TOBRAMYCIN STANDARD DOSING IVPB   Intravenous   Inject 180 mg into the vein once.   1 each   0   . DOCUSATE SODIUM 100 MG PO CAPS   Oral   Take 100 mg by mouth 2 (two) times daily.         Marland Kitchen  ENOXAPARIN SODIUM 40 MG/0.4ML [place] SOLN   Subcutaneous   Inject 0.4 mLs (40 mg total) into the skin daily.   30 Syringe   0   . PRO-STAT 64 PO LIQD   Oral   Take 30 mLs by mouth 4 (four) times daily.   900 mL      . FERROUS SULFATE 325 (65 FE) MG PO TABS   Oral   Take 325 mg by mouth daily with breakfast.         . FLUTICASONE PROPIONATE 50 MCG/ACT NA SUSP   Nasal   Place 1 spray into the nose daily.         . IPRATROPIUM BROMIDE 0.02 % IN SOLN   Nebulization   Take 500 mcg by nebulization every 6 (six) hours. With albuterol         . LEVALBUTEROL HCL 0.63 MG/3ML IN NEBU   Nebulization   Take 0.63 mg by nebulization every 6 (six) hours.         Marland Kitchen METOPROLOL TARTRATE 25 MG PO TABS   Oral   Take 12.5 mg by mouth 2 (two) times daily. Given at 1000 & 2200         . CERTA-VITE PO   Oral   Take 1 tablet by mouth every morning.          Marland Kitchen OLANZAPINE 15 MG PO TABS   Oral   Take 15 mg by mouth at bedtime.         . OMEPRAZOLE 20 MG PO CPDR   Oral   Take 20 mg by mouth 2 (two) times daily.         Marland Kitchen  ONDANSETRON HCL 4 MG PO TABS   Oral   Take 4 mg by mouth every 6 (six) hours as needed. For nausea         . OXYBUTYNIN CHLORIDE 5 MG PO TABS   Oral   Take 5 mg by mouth 2 (two) times daily as needed. For bladder spasms         . OXYCODONE HCL 5 MG PO CAPS   Oral   Take 10 mg by mouth every 6 (six) hours as needed. For pain         . POLYETHYLENE GLYCOL 3350 PO PACK   Oral   Take 17 g by mouth 2 (two) times daily.         . SENNA 8.6 MG PO TABS   Oral   Take 2 tablets by mouth daily as needed. For constipation         . LISTERMINT MT   Mouth/Throat   Use as directed 15 mLs in the mouth or throat 2 (two) times daily. Swish & spit         . TRAZODONE HCL 100 MG PO TABS   Oral   Take 100 mg by mouth at bedtime.         . VENLAFAXINE HCL 100 MG PO TABS   Oral   Take 100 mg by mouth 2 (two) times daily.           BP 157/74  Pulse 91  Temp 100.1 F (37.8 C) (Oral)  Resp 18  SpO2 99%  Physical Exam CONSTITUTIONAL: chronically ill appearing HEAD AND FACE: Normocephalic/atraumatic EYES: EOMI ENMT: Mucous membranes moist NECK: trach in place.  There is no blood/drainage surrounding site.  No crepitance to neck SPINE: no bruising noted to spine CV:  no murmurs/rubs/gallops noted Chest -  port a cath in place in left chest LUNGS: coarse BS noted bilaterally.  There is no distress noted.  She is able to speak to me comfortably ABDOMEN: soft, nontender, no rebound or guarding. G-tube in place, no drainage/erythema noted. Obesity noted GU: no bruising/erythema noted. Foley in place NEURO: Pt is awake/alert. No distress is noted. EXTREMITIES: pt has booties in place on both feet SKIN: warm. No skin breakdown noted to sacrum/buttocks PSYCH: no abnormalities of mood noted  ED Course  Procedures    Labs Reviewed  BASIC METABOLIC PANEL  CBC WITH DIFFERENTIAL  11:40 PM Pt presents from nursing facility with fever, increased SOB and chest pain (per chart  this is chronic) She has already been diagnosed with pneumonia but has not had antibiotics started as of yet.  She is also requiring higher oxygen levels at this time. Will follow closely 12:01 AM Suspicion for aspiration pneumonia.  After discussion with pharmacy, will order unasyn  D/w [facility] david.  Will place on tele for full admit.  She requests zosyn/levaquin/vancomycin  MDM  Nursing notes including past medical history and social history reviewed and considered in documentation xrays reviewed and considered Labs/vital reviewed and considered Previous records reviewed and considered - previous hospital d/c summary reviewed    Date: 03/08/2012  Rate: 82  Rhythm: normal sinus rhythm  QRS Axis: normal  Intervals: normal  ST/T Wave abnormalities: normal  Conduction Disutrbances:none  Narrative Interpretation:   Old EKG Reviewed: unchanged        Joya Gaskins, MD 03/09/12 [phone]

## 2012-03-08 NOTE — ED Notes (Signed)
EDP at bedside  

## 2012-03-08 NOTE — ED Notes (Addendum)
Per PTAR, pt  Is from [facility]. Pt was diagnosed with PNA today. She was supposed to get ABX starting today but they have not been delivered to SNF. Pt started have difficulty breathing.Pt also stated that she has left side chest pain that moves up and down. She stated that her food and fluids have been going into her lungs.  Pt received 650 Tylenol PO at 2145 for temperature of 100.4.Pulse rate  88, Resp 12, SBP 158 palpable. O2 Sats 86% on 5L at 28% via trach. Now 8L and oxygen 100%. Quadriplegic.

## 2012-03-08 NOTE — ED Notes (Signed)
Report given to [employer]. IV team made aware that [facility] a Cath needs access. RN made aware that pt needs to be suctioned.

## 2012-03-09 ENCOUNTER — Encounter (HOSPITAL_COMMUNITY): Payer: Self-pay | Admitting: *Deleted

## 2012-03-09 DIAGNOSIS — J189 Pneumonia, unspecified organism: Secondary | ICD-10-CM

## 2012-03-09 DIAGNOSIS — Z930 Tracheostomy status: Secondary | ICD-10-CM

## 2012-03-09 DIAGNOSIS — D638 Anemia in other chronic diseases classified elsewhere: Secondary | ICD-10-CM

## 2012-03-09 DIAGNOSIS — F411 Generalized anxiety disorder: Secondary | ICD-10-CM

## 2012-03-09 DIAGNOSIS — J9600 Acute respiratory failure, unspecified whether with hypoxia or hypercapnia: Secondary | ICD-10-CM

## 2012-03-09 LAB — LEGIONELLA ANTIGEN, URINE

## 2012-03-09 LAB — BASIC METABOLIC PANEL
BUN: 17 mg/dL (ref 6–23)
Calcium: 8.9 mg/dL (ref 8.4–10.5)
GFR calc Af Amer: 79 mL/min — ABNORMAL LOW (ref >90)
GFR calc non Af Amer: 68 mL/min — ABNORMAL LOW (ref >90)
Glucose, Bld: 92 mg/dL (ref 70–99)
Potassium: 4.3 mEq/L (ref 3.5–5.1)
Sodium: 139 mEq/L (ref 135–145)

## 2012-03-09 LAB — CBC WITH DIFFERENTIAL/PLATELET
Basophils Absolute: 0 10*3/uL (ref 0.0–0.1)
Basophils Relative: 0 % (ref 0–1)
Eosinophils Absolute: 0.4 10*3/uL (ref 0.0–0.7)
Hemoglobin: 10 g/dL — ABNORMAL LOW (ref 12.0–15.0)
MCH: 24.2 pg — ABNORMAL LOW (ref 26.0–34.0)
MCHC: 30.6 g/dL (ref 30.0–36.0)
Monocytes Relative: 6 % (ref 3–12)
Neutro Abs: 8.6 10*3/uL — ABNORMAL HIGH (ref 1.7–7.7)
Neutrophils Relative %: 64 % (ref 43–77)
Platelets: 215 10*3/uL (ref 150–400)
RDW: 15.6 % — ABNORMAL HIGH (ref 11.5–15.5)

## 2012-03-09 LAB — MRSA PCR SCREENING: MRSA by PCR: POSITIVE — AB

## 2012-03-09 LAB — TROPONIN I: Troponin I: <0.3 ng/mL (ref <0.30)

## 2012-03-09 LAB — STREP PNEUMONIAE URINARY ANTIGEN: Strep Pneumo Urinary Antigen: NEGATIVE

## 2012-03-09 MED ORDER — SODIUM CHLORIDE 0.9 % IV SOLN
250.0000 mL | INTRAVENOUS | Status: DC | PRN
  Administered 2012-03-11: 250 mL via INTRAVENOUS

## 2012-03-09 MED ORDER — RISAQUAD PO CAPS
1.0000 | ORAL_CAPSULE | Freq: Every day | ORAL | Status: DC
  Administered 2012-03-09 – 2012-03-14 (×6): 1 via ORAL
  Filled 2012-03-09 (×6): qty 1

## 2012-03-09 MED ORDER — CHLORHEXIDINE GLUCONATE CLOTH 2 % EX PADS
6.0000 | MEDICATED_PAD | Freq: Every day | CUTANEOUS | Status: AC
  Administered 2012-03-09 – 2012-03-13 (×5): 6 via TOPICAL

## 2012-03-09 MED ORDER — PRO-STAT SUGAR FREE PO LIQD
30.0000 mL | Freq: Three times a day (TID) | ORAL | Status: DC
  Administered 2012-03-09 – 2012-03-14 (×15): 30 mL via ORAL
  Filled 2012-03-09 (×19): qty 30

## 2012-03-09 MED ORDER — MUPIROCIN 2 % EX OINT
1.0000 "application " | TOPICAL_OINTMENT | Freq: Two times a day (BID) | CUTANEOUS | Status: AC
  Administered 2012-03-09 – 2012-03-13 (×10): 1 via NASAL
  Filled 2012-03-09: qty 22

## 2012-03-09 MED ORDER — VANCOMYCIN HCL IN DEXTROSE 1-5 GM/200ML-% IV SOLN: 1000.0000 mg | Freq: Once | INTRAVENOUS | Status: DC

## 2012-03-09 MED ORDER — SODIUM CHLORIDE 0.9 % IJ SOLN
10.0000 mL | Freq: Two times a day (BID) | INTRAMUSCULAR | Status: DC
  Administered 2012-03-11: 10 mL

## 2012-03-09 MED ORDER — OXYCODONE HCL 5 MG PO TABS: 15.0000 mg | ORAL_TABLET | Freq: Four times a day (QID) | ORAL | Status: DC | PRN

## 2012-03-09 MED ORDER — BIOTENE DRY MOUTH MT LIQD
15.0000 mL | Freq: Two times a day (BID) | OROMUCOSAL | Status: DC
  Administered 2012-03-09 – 2012-03-14 (×10): 15 mL via OROMUCOSAL

## 2012-03-09 MED ORDER — CEFTAZIDIME 1 G IV SOLR: 1.0000 g | Freq: Three times a day (TID) | INTRAVENOUS | Status: DC

## 2012-03-09 MED ORDER — DEXTROSE 5 % IV SOLN
1.0000 g | Freq: Three times a day (TID) | INTRAVENOUS | Status: DC
  Administered 2012-03-09 – 2012-03-10 (×4): 1 g via INTRAVENOUS
  Filled 2012-03-09 (×6): qty 1

## 2012-03-09 MED ORDER — PIPERACILLIN-TAZOBACTAM 3.375 G IVPB 30 MIN
3.3750 g | Freq: Once | INTRAVENOUS | Status: AC
  Administered 2012-03-09: 3.375 g via INTRAVENOUS
  Filled 2012-03-09: qty 50

## 2012-03-09 MED ORDER — IPRATROPIUM BROMIDE HFA 17 MCG/ACT IN AERS
1.0000 | INHALATION_SPRAY | Freq: Four times a day (QID) | RESPIRATORY_TRACT | Status: DC
  Filled 2012-03-09: qty 12.9

## 2012-03-09 MED ORDER — ENOXAPARIN SODIUM 40 MG/0.4ML ~~LOC~~ SOLN
40.0000 mg | SUBCUTANEOUS | Status: DC
  Administered 2012-03-09 – 2012-03-14 (×6): 40 mg via SUBCUTANEOUS
  Filled 2012-03-09 (×6): qty 0.4

## 2012-03-09 MED ORDER — GUAIFENESIN 100 MG/5ML PO SOLN
15.0000 mL | Freq: Three times a day (TID) | ORAL | Status: DC
  Administered 2012-03-09 – 2012-03-14 (×17): 300 mg via ORAL
  Filled 2012-03-09 (×19): qty 15

## 2012-03-09 MED ORDER — OXYCODONE HCL 5 MG PO TABS
15.0000 mg | ORAL_TABLET | Freq: Four times a day (QID) | ORAL | Status: DC | PRN
  Administered 2012-03-09 – 2012-03-13 (×9): 15 mg via ORAL
  Filled 2012-03-09 (×10): qty 3

## 2012-03-09 MED ORDER — VENLAFAXINE HCL 50 MG PO TABS
100.0000 mg | ORAL_TABLET | Freq: Two times a day (BID) | ORAL | Status: DC
  Administered 2012-03-09 – 2012-03-14 (×11): 100 mg via ORAL
  Filled 2012-03-09 (×14): qty 2

## 2012-03-09 MED ORDER — SODIUM CHLORIDE 0.9 % IJ SOLN
10.0000 mL | INTRAMUSCULAR | Status: DC | PRN
Start: 2012-03-09 — End: 2012-03-09
  Administered 2012-03-09: 20 mL

## 2012-03-09 MED ORDER — CLONIDINE HCL 0.2 MG PO TABS
0.2000 mg | ORAL_TABLET | Freq: Two times a day (BID) | ORAL | Status: DC
  Administered 2012-03-09 – 2012-03-14 (×10): 0.2 mg via ORAL
  Filled 2012-03-09 (×12): qty 1

## 2012-03-09 MED ORDER — ALBUTEROL SULFATE (5 MG/ML) 0.5% IN NEBU
2.5000 mg | INHALATION_SOLUTION | RESPIRATORY_TRACT | Status: DC | PRN
  Administered 2012-03-09 – 2012-03-13 (×4): 2.5 mg via RESPIRATORY_TRACT
  Filled 2012-03-09 (×3): qty 0.5

## 2012-03-09 MED ORDER — SODIUM CHLORIDE 0.9 % IJ SOLN: 3.0000 mL | INTRAMUSCULAR | Status: DC | PRN

## 2012-03-09 MED ORDER — SUCRALFATE 1 G PO TABS
1.0000 g | ORAL_TABLET | Freq: Two times a day (BID) | ORAL | Status: DC
  Administered 2012-03-09 – 2012-03-11 (×4): 1 g via ORAL
  Filled 2012-03-09 (×5): qty 1

## 2012-03-09 MED ORDER — SODIUM CHLORIDE 0.9 % IJ SOLN
3.0000 mL | Freq: Two times a day (BID) | INTRAMUSCULAR | Status: DC
  Administered 2012-03-09 – 2012-03-13 (×5): 3 mL via INTRAVENOUS

## 2012-03-09 MED ORDER — FUROSEMIDE 20 MG PO TABS
20.0000 mg | ORAL_TABLET | Freq: Every day | ORAL | Status: DC
  Administered 2012-03-09 – 2012-03-14 (×6): 20 mg via ORAL
  Filled 2012-03-09 (×6): qty 1

## 2012-03-09 MED ORDER — FLORA-Q PO CAPS: 1.0000 | ORAL_CAPSULE | Freq: Every day | ORAL | Status: DC

## 2012-03-09 MED ORDER — ALBUTEROL SULFATE (5 MG/ML) 0.5% IN NEBU
2.5000 mg | INHALATION_SOLUTION | Freq: Four times a day (QID) | RESPIRATORY_TRACT | Status: DC
  Administered 2012-03-09 – 2012-03-13 (×19): 2.5 mg via RESPIRATORY_TRACT
  Filled 2012-03-09 (×20): qty 0.5

## 2012-03-09 MED ORDER — ASPIRIN 325 MG PO TABS
325.0000 mg | ORAL_TABLET | Freq: Every day | ORAL | Status: DC
  Administered 2012-03-09 – 2012-03-14 (×6): 325 mg via ORAL
  Filled 2012-03-09 (×6): qty 1

## 2012-03-09 MED ORDER — GI COCKTAIL ~~LOC~~
30.0000 mL | Freq: Once | ORAL | Status: AC
  Administered 2012-03-09: 30 mL via ORAL
  Filled 2012-03-09: qty 30

## 2012-03-09 MED ORDER — SODIUM CHLORIDE 0.9 % IJ SOLN
10.0000 mL | INTRAMUSCULAR | Status: DC | PRN
  Administered 2012-03-09 – 2012-03-14 (×6): 10 mL

## 2012-03-09 MED ORDER — TRAZODONE HCL 150 MG PO TABS
150.0000 mg | ORAL_TABLET | Freq: Every day | ORAL | Status: DC
  Administered 2012-03-09 – 2012-03-13 (×5): 150 mg via ORAL
  Filled 2012-03-09 (×6): qty 1

## 2012-03-09 MED ORDER — OLANZAPINE 10 MG PO TABS
10.0000 mg | ORAL_TABLET | Freq: Every day | ORAL | Status: DC
  Administered 2012-03-09 – 2012-03-13 (×5): 10 mg via ORAL
  Filled 2012-03-09 (×6): qty 1

## 2012-03-09 MED ORDER — VENLAFAXINE HCL 50 MG PO TABS: 100.0000 mg | ORAL_TABLET | Freq: Two times a day (BID) | ORAL | Status: DC

## 2012-03-09 MED ORDER — POLYETHYLENE GLYCOL 3350 17 G PO PACK
17.0000 g | PACK | Freq: Two times a day (BID) | ORAL | Status: DC
  Administered 2012-03-09 – 2012-03-14 (×10): 17 g via ORAL
  Filled 2012-03-09 (×11): qty 1

## 2012-03-09 MED ORDER — PANTOPRAZOLE SODIUM 40 MG PO PACK
40.0000 mg | PACK | Freq: Every day | ORAL | Status: DC
  Administered 2012-03-09 – 2012-03-14 (×6): 40 mg via ORAL
  Filled 2012-03-09 (×8): qty 20

## 2012-03-09 MED ORDER — LEVOFLOXACIN IN D5W 750 MG/150ML IV SOLN: 750.0000 mg | Freq: Once | INTRAVENOUS | Status: DC

## 2012-03-09 MED ORDER — PANTOPRAZOLE SODIUM 40 MG IV SOLR
40.0000 mg | Freq: Once | INTRAVENOUS | Status: AC
  Administered 2012-03-09: 40 mg via INTRAVENOUS
  Filled 2012-03-09: qty 40

## 2012-03-09 MED ORDER — IPRATROPIUM BROMIDE 0.02 % IN SOLN
0.5000 mg | Freq: Four times a day (QID) | RESPIRATORY_TRACT | Status: DC
  Administered 2012-03-09 – 2012-03-13 (×19): 0.5 mg via RESPIRATORY_TRACT
  Filled 2012-03-09 (×19): qty 2.5

## 2012-03-09 MED ORDER — TRAMADOL HCL 50 MG PO TABS
50.0000 mg | ORAL_TABLET | Freq: Three times a day (TID) | ORAL | Status: DC
  Administered 2012-03-09 – 2012-03-14 (×15): 50 mg via ORAL
  Filled 2012-03-09 (×16): qty 1

## 2012-03-09 MED ORDER — SODIUM CHLORIDE 0.9 % IV SOLN
3.0000 g | Freq: Once | INTRAVENOUS | Status: AC
  Administered 2012-03-09: 3 g via INTRAVENOUS
  Filled 2012-03-09: qty 3

## 2012-03-09 NOTE — Progress Notes (Signed)
7:53 AM I agree with HPI/GPe and A/P per Dr. Onalee Hua  Chart review   Admission 11/16/11 with hypoxia, HCAP in a setting of chronic resp failure 2/2 to quadraplegia-d/c [facility] on that admission  Admission 10/19/11 for HCAP--thought eventually to be more related to CHF exacerbation  Admission 6.11.13 with SIRS 2/2 to HCAP, ARF-noted neurogenic bladder as well, pressure ulcers  Admission 9/02/20/10 with UTI/SIRS-had Trach change at the time   Admission 06/28/10 for Sepsis 2/2 to Urine origin  Admission 05/26/10 for Pyelonephritis  Admission 01/20/10 with PNA 2/2 to [place]axella  Trach allegedly placed sometime in 3 or 05/2009  HEENT CHEST CARDIAC ABDOMEN NEURO SKIN/MUSCULAR  Patient Active Problem List  Diagnosis  . Healthcare-associated pneumonia  . Acute respiratory failure with hypoxia  . Anemia of chronic disease  . Tracheostomy in place  . Neurogenic bladder with chronic Foley catheter  . Quadriplegia  . Schizophrenia  . Anxiety disorder  . Hypoxemia  . UTI (lower urinary tract infection)  . Pseudomonas infection   1. A on chronic Resp failure-Was hypoxic to the 86% on admission on 5L at 28%-resolviong, 2/2 to #2 2. HCAP vs Aspiration PNA-Probably likely etiology-CXR shows some fluid in lungs, but WBC 13.5- Continue Unasyn, Ceftazidime, Vancomycin and Levqaquin for now-Narrow to Unasyn in am-Get SLP involved in care r/o aspiration  Rpt CBC + Diff am.  Follow Blood cult.  Get CXR 1.28 to compare-Follow gram stain and sputum cult-Strep Pna is neg.  Follow Legionella. 3. Unlikely Cardiac CP-This ic chronic-monitor. 4. ?h/o CHF-EF 7/176/13-EF55-60% poor study.  Will reassess. 5. Neurogenic bladder-Stable.  Needs q2-4 weeks Catheter changes 6. Quadriplegia-PT/OT Consult to help with mobility 7. AOCD-Hb stable 8. Htn-Continue Clonidine 0.2 bid, LAsix 20 po daily 9. ? COPD-continue Albuterol 2.5 q6, Atrovent 0.5 q 6 10. Depression- cont Trazadone 150 qhs, Olnazipine 10 mg  qhs, Venlafaxine 100 bid  Pleas Koch, MD [facility] [phone]

## 2012-03-09 NOTE — Progress Notes (Signed)
Pt c/o c/p at 12pm, pt stated it felt sharp, EKG done showed NSR PRN oxy given, Dr. Mahala Menghini notified, pt c/o c/p again at 3:40pm EKG performed, Dr. Mahala Menghini notified, MD ordered Carafate and Ultram for pain, VSS EKG WNL, will continue to monitor Worthy Flank, RN

## 2012-03-09 NOTE — Progress Notes (Signed)
Received new orders for patient's c/o chest pain. NT attempted to do STAT EKG as ordered.  Patient refusing EKG, MD paged to notify. Will continue to monitor.  Troy Sine

## 2012-03-09 NOTE — Progress Notes (Signed)
CRITICAL VALUE ALERT  Critical value received:+MRSA Date of notification:  03/09/12 Time of notification:  0655 Critical value read back:YES Nurse who received alert:  Tobias Alexander MD notified (1st page):    Time of first page:    MD notified (2nd page):  Time of second page:  Responding MD:    Time MD responded:
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
## 2012-03-09 NOTE — Progress Notes (Signed)
Patient arrived to unit from ed via stretcher. Patient oriented to unit. Patient had c/o chest pain upon arrival.  L.[place] notified of patient's complaint and received instructions to administer po meds via peg until further notice. Will continue to monitor.Troy Sine

## 2012-03-09 NOTE — Progress Notes (Addendum)
Pt complaining of chest pain. Stat EKG being performed. Notifying MD. Tsosie Billing [facility] SRN...   RN caring for pt aware and called MD to obtain new orders. Orders carried out.

## 2012-03-09 NOTE — ED Notes (Signed)
IV team at bedside 

## 2012-03-09 NOTE — Plan of Care (Signed)
Problem: Phase I Progression Outcomes Goal: Dyspnea controlled at rest Outcome: Completed/Met Date Met:  03/09/12 Pt has not had any c/o SOB while at rest, goal met Goal: Confirm chest x-ray completed Outcome: Completed/Met Date Met:  03/09/12 CXR completed Goal: Initial discharge plan identified Outcome: Completed/Met Date Met:  03/09/12 Initial plan of d/c is for pt to return to [facility] Goal: Voiding-avoid urinary catheter unless indicated Outcome: Not Met (add Reason) Pt has chronic foley d/t neurogenic bladder

## 2012-03-09 NOTE — ED Notes (Signed)
Attempted to call report. Floor RN unable to accept report.  
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
## 2012-03-09 NOTE — Progress Notes (Signed)
PATIENT HAVING C/O OF CHEST PAIN/EPIGASTIC PAIN. ATTEMPTED TO GIVE PATIENT SL NTG AS PER PROTOCOL, PATIENT REFUSING NTG STATING SHE WANTS SOMETHING FOR PAIN. MD ON CALL PAGED, WILL CONTINUE TO MONITOR AND AWAIT FURTHER ORDERS.  [name]

## 2012-03-10 LAB — CBC WITH DIFFERENTIAL/PLATELET
Basophils Absolute: 0 10*3/uL (ref 0.0–0.1)
Eosinophils Absolute: 0.2 10*3/uL (ref 0.0–0.7)
Eosinophils Relative: 2 % (ref 0–5)
MCH: 24.3 pg — ABNORMAL LOW (ref 26.0–34.0)
MCV: 80 fL (ref 78.0–100.0)
Platelets: 198 10*3/uL (ref 150–400)
RDW: 15.4 % (ref 11.5–15.5)
WBC: 10.5 10*3/uL (ref 4.0–10.5)

## 2012-03-10 MED ORDER — SODIUM CHLORIDE 0.9 % IV SOLN
3.0000 g | Freq: Four times a day (QID) | INTRAVENOUS | Status: DC
  Administered 2012-03-10 – 2012-03-11 (×6): 3 g via INTRAVENOUS
  Filled 2012-03-10 (×8): qty 3

## 2012-03-10 MED ORDER — ENSURE PUDDING PO PUDG
1.0000 | Freq: Every day | ORAL | Status: DC
  Administered 2012-03-11 – 2012-03-13 (×2): 1 via ORAL

## 2012-03-10 NOTE — Plan of Care (Signed)
Problem: Phase I Progression Outcomes Goal: OOB as tolerated unless otherwise ordered Outcome: Completed/Met Date Met:  03/10/12 Pt oob via hoyer lift to chair, pt tolerating well, goal met Goal: First antibiotic given within 6hrs of admit Outcome: Completed/Met Date Met:  03/10/12 Pt receiving abx, goal met Goal: Voiding-avoid urinary catheter unless indicated Outcome: Not Met (add Reason) Pt has chronic foley
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
## 2012-03-10 NOTE — Progress Notes (Signed)
Utilization Review Completed.   Magdalina Whitehead, RN, BSN Nurse Case Manager  [phone]  
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
## 2012-03-10 NOTE — Progress Notes (Signed)
Agree with note written by Dietetic Intern  Clarene Duke RD, LDN Pager [phone] After Hours pager [phone]
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
## 2012-03-10 NOTE — Progress Notes (Signed)
INITIAL NUTRITION ASSESSMENT  DOCUMENTATION CODES Per approved criteria  -Morbid Obesity   INTERVENTION: 1. Continue 30 ml Prostat TID. Each supplement provides 100 kcal and 15 g of protein.  2. Ensure complete once a day. Each supplement provides 350 kcal and 13 g of protein.  2. Adult multivitamin  3. Recommend 2,000 mg/d of Vitamin C in divided doses for 14 days.  4. Recommend 50 mg elemental zinc/day for 14 days.   NUTRITION DIAGNOSIS: Increased nutrient needs related to wound healing as evidenced by stage 2 pressure ulcer on upper left thigh and deep tissue injury on heel.   Goal: Pt to meet >/= 90% of estimated needs  Monitor:  PO intake, weight, wound care   Reason for Assessment: Low Braden Score (12)  47 y.o. female  Admitting Dx: Acute respiratory failure with hypoxia  ASSESSMENT: Pt admitted from SNF with PNA. Pt 47 yo quadriplegic with PMH of respiratory failure and dysphagia. Pt is trach dependent.  On 03/09/2012, pt seen by SLP and passed swallow eval. Pt advanced to regular diet. 03/10/2012, pt consumed 85% of lunch tray.  No reports of decreased appetite. According to wt history and MST, pt has not lost any weight.   Pt has healing stage 2 pressure ulcer on upper left thigh and deep tissue injury on heel. At SNF, pt received regular oral protein supplement TID and Boost nutrition supplement once a day.   Height:  5\' 2"     Weight: Wt Readings from Last 1 Encounters:  03/10/12 231 lb 9.6 oz (105.053 kg)    Ideal Body Weight: 93.5 - 99 lbs   % Ideal Body Weight: 233 - 247%   Wt Readings from Last 10 Encounters:  03/10/12 231 lb 9.6 oz (105.053 kg)  11/19/11 234 lb 12.6 oz (106.5 kg)  10/25/11 237 lb 7 oz (107.7 kg)  08/28/11 220 lb 7.4 oz (100 kg)  07/25/11 234 lb 9.1 oz (106.4 kg)    Usual Body Weight: ~230 lbs   % Usual Body Weight: 99.5%  BMI:  43.3 Mordbid obesity    Estimated Nutritional Needs: Kcal: 1900 - 2100  Protein: 90 - 120 g daily    Fluid: 1.9 - 2.1 L   Skin: stage 2 pressure ulcer on upper left thigh and deep tissue injury on heel  Diet Order: General  EDUCATION NEEDS: -No education needs identified at this time   Intake/Output Summary (Last 24 hours) at 03/10/12 1444 Last data filed at 03/10/12 1231  Gross per 24 hour  Intake    960 ml  Output   3550 ml  Net  -2590 ml    Last BM: 03/09/2012   Labs:   Lab 03/08/12 2359  NA 139  K 4.3  CL 101  CO2 32  BUN 17  CREATININE 0.97  CALCIUM 8.9  MG --  PHOS --  GLUCOSE 92    CBG (last 3)  No results found for this basename: GLUCAP:3 in the last 72 hours  Scheduled Meds:   . acidophilus  1 capsule Oral Daily  . albuterol  2.5 mg Nebulization Q6H  . ampicillin-sulbactam (UNASYN) IV  3 g Intravenous Q6H  . antiseptic oral rinse  15 mL Mouth Rinse BID  . aspirin  325 mg Oral Daily  . Chlorhexidine Gluconate Cloth  6 each Topical Q0600  . cloNIDine  0.2 mg Oral BID  . enoxaparin (LOVENOX) injection  40 mg Subcutaneous Q24H  . feeding supplement  30 mL Oral TID WC  .  furosemide  20 mg Oral Daily  . guaiFENesin  15 mL Oral Q8H  . ipratropium  0.5 mg Nebulization Q6H  . mupirocin ointment  1 application Nasal BID  . OLANZapine  10 mg Oral QHS  . pantoprazole sodium  40 mg Oral Daily  . polyethylene glycol  17 g Oral BID  . sodium chloride  10-40 mL Intracatheter Q12H  . sodium chloride  3 mL Intravenous Q12H  . sucralfate  1 g Oral BID  . traMADol  50 mg Oral TID  . traZODone  150 mg Oral QHS  . venlafaxine  100 mg Oral BID WC    Continuous Infusions:   Past Medical History  Diagnosis Date  . Respiratory failure   . Dysphagia   . Contracture of hand joint   . Anemia   . Neurogenic bladder   . Anxiety   . Schizophrenia   . Quadriplegia   . Hypertension   . Pneumonia     Past Surgical History  Procedure Date  . Tracheostomy   . Gastrostomy w/ feeding tube    Belenda Cruise  Dietetic Intern Pager: [phone]

## 2012-03-10 NOTE — Progress Notes (Signed)
Pt refused pm trach care

## 2012-03-10 NOTE — Progress Notes (Signed)
PATIENT CONTINUES TO HAVE C/O CHEST PAIN AND SOB.  TRACH CARE AND SUCTIONING COMPLETED, PATIENT'S SPO2 REMAINS 98-100% ON TRACH COLLAR AT 8L 35%.  RESPIRATORY NOTIFIED OF PATIENT'S CONTINUED C/O SOB.  BREATHING TREATMENTS ADMINISTERED.  SCHEDULED TRAMADOL AND TRAZADONE GIVEN TO PATIENT AS ORDERED FOR C/O PAIN-PATIENT STATED SHE HAS HAD NO RELIEF WITH ANY INTERVENTIONS ALTHOUGH SHE APPEARS TO BE IN NO VISIBLE DISTRESS.  MD NOTIFIED OF PATIENT'S COMPLAINTS-WILL CONTINUE TO MONITOR.  Blood pressure 152/86, pulse 88, temperature 99.1 F (37.3 C), temperature source Oral, resp. rate 20, SpO2 100.00%. Troy Sine

## 2012-03-11 ENCOUNTER — Inpatient Hospital Stay (HOSPITAL_COMMUNITY): Payer: Medicare Other

## 2012-03-11 LAB — BASIC METABOLIC PANEL
BUN: 19 mg/dL (ref 6–23)
Calcium: 9.3 mg/dL (ref 8.4–10.5)
GFR calc non Af Amer: >90 mL/min (ref >90)
Glucose, Bld: 111 mg/dL — ABNORMAL HIGH (ref 70–99)

## 2012-03-11 LAB — CBC
HCT: 33.4 % — ABNORMAL LOW (ref 36.0–46.0)
Hemoglobin: 10.2 g/dL — ABNORMAL LOW (ref 12.0–15.0)
MCH: 23.9 pg — ABNORMAL LOW (ref 26.0–34.0)
MCHC: 30.5 g/dL (ref 30.0–36.0)
MCV: 78.4 fL (ref 78.0–100.0)

## 2012-03-11 MED ORDER — GI COCKTAIL ~~LOC~~
30.0000 mL | Freq: Once | ORAL | Status: AC
  Administered 2012-03-11: 30 mL via ORAL
  Filled 2012-03-11: qty 30

## 2012-03-11 MED ORDER — SUCRALFATE 1 G PO TABS
1.0000 g | ORAL_TABLET | Freq: Three times a day (TID) | ORAL | Status: DC
  Administered 2012-03-11 – 2012-03-14 (×10): 1 g via ORAL
  Filled 2012-03-11 (×15): qty 1

## 2012-03-11 MED ORDER — LORAZEPAM 0.5 MG PO TABS
1.0000 mg | ORAL_TABLET | Freq: Once | ORAL | Status: AC
  Administered 2012-03-11: 1 mg via ORAL
  Filled 2012-03-11 (×2): qty 1

## 2012-03-11 MED ORDER — LEVOFLOXACIN IN D5W 750 MG/150ML IV SOLN
750.0000 mg | INTRAVENOUS | Status: AC
  Administered 2012-03-11 – 2012-03-13 (×3): 750 mg via INTRAVENOUS
  Filled 2012-03-11 (×3): qty 150

## 2012-03-11 MED ORDER — CEFEPIME HCL 1 G IJ SOLR
1.0000 g | Freq: Three times a day (TID) | INTRAMUSCULAR | Status: DC
  Administered 2012-03-11 – 2012-03-14 (×8): 1 g via INTRAVENOUS
  Filled 2012-03-11 (×10): qty 1

## 2012-03-11 MED ORDER — VANCOMYCIN HCL IN DEXTROSE 1-5 GM/200ML-% IV SOLN
1000.0000 mg | Freq: Two times a day (BID) | INTRAVENOUS | Status: DC
  Administered 2012-03-12 – 2012-03-13 (×4): 1000 mg via INTRAVENOUS
  Filled 2012-03-11 (×6): qty 200

## 2012-03-11 MED ORDER — VANCOMYCIN HCL 10 G IV SOLR
2000.0000 mg | Freq: Once | INTRAVENOUS | Status: AC
  Administered 2012-03-11: 2000 mg via INTRAVENOUS
  Filled 2012-03-11: qty 2000

## 2012-03-11 NOTE — Progress Notes (Signed)
ANTIBIOTIC CONSULT NOTE - INITIAL  Pharmacy Consult for Vancomycin, abx dosing per renal function Indication: rule out pneumonia  Allergies  Allergen Reactions  . Latex Other (See Comments)    Reaction unknown    Patient Measurements: Weight: 229 lb 11.2 oz (104.191 kg)   Vital Signs: Temp: 98.8 F (37.1 C) (01/28 1330) Temp src: Oral (01/28 1330) BP: 124/93 mmHg (01/28 1330) Pulse Rate: 104  (01/28 1550) Intake/Output from previous day: 01/27 0701 - 01/28 0700 In: 943.2 [P.O.:720; I.V.:23.2; IV Piggyback:200] Out: 3600 [Urine:3600] Intake/Output from this shift: Total I/O In: 663 [P.O.:660; I.V.:3] Out: 1776 [Urine:1775; Stool:1]  Labs:  [facility] 03/11/12 0500 03/10/12 0047 03/08/12 2359  WBC 12.0* 10.5 13.5*  HGB 10.2* 9.6* 10.0*  PLT 208 198 215  LABCREA -- -- --  CREATININE 0.78 -- 0.97   The CrCl is unknown because both a height and weight (above a minimum accepted value) are required for this calculation. No results found for this basename: VANCOTROUGH:2,VANCOPEAK:2,VANCORANDOM:2,GENTTROUGH:2,GENTPEAK:2,GENTRANDOM:2,TOBRATROUGH:2,TOBRAPEAK:2,TOBRARND:2,AMIKACINPEAK:2,AMIKACINTROU:2,AMIKACIN:2, in the last 72 hours   Microbiology: Recent Results (from the past 720 hour(s))  CULTURE, BLOOD (ROUTINE X 2)     Status: Normal (Preliminary result)   Collection Time   03/09/12 12:25 AM      Component Value Range Status Comment   Specimen Description BLOOD RIGHT HAND   Final    Special Requests BOTTLES DRAWN AEROBIC AND ANAEROBIC 10CC EACH   Final    Culture  Setup Time 03/09/2012 15:02   Final    Culture     Final    Value:        BLOOD CULTURE RECEIVED NO GROWTH TO DATE CULTURE WILL BE HELD FOR 5 DAYS BEFORE ISSUING A FINAL NEGATIVE REPORT   Report Status PENDING   Incomplete   CULTURE, BLOOD (ROUTINE X 2)     Status: Normal (Preliminary result)   Collection Time   03/09/12 12:35 AM      Component Value Range Status Comment   Specimen Description BLOOD LEFT HAND    Final    Special Requests BOTTLES DRAWN AEROBIC AND ANAEROBIC 10CC EACH   Final    Culture  Setup Time 03/09/2012 15:02   Final    Culture     Final    Value:        BLOOD CULTURE RECEIVED NO GROWTH TO DATE CULTURE WILL BE HELD FOR 5 DAYS BEFORE ISSUING A FINAL NEGATIVE REPORT   Report Status PENDING   Incomplete   MRSA PCR SCREENING     Status: Abnormal   Collection Time   03/09/12  5:27 AM      Component Value Range Status Comment   MRSA by PCR POSITIVE (*) NEGATIVE Final   CULTURE, RESPIRATORY     Status: Normal (Preliminary result)   Collection Time   03/10/12  2:58 PM      Component Value Range Status Comment   Specimen Description TRACHEAL ASPIRATE   Final    Special Requests NONE   Final    Gram Stain     Final    Value: ABUNDANT WBC PRESENT,BOTH PMN AND MONONUCLEAR     MODERATE SQUAMOUS EPITHELIAL CELLS PRESENT     MODERATE GRAM POSITIVE RODS     FEW GRAM NEGATIVE RODS   Culture PENDING   Incomplete    Report Status PENDING   Incomplete     Medical History: Past Medical History  Diagnosis Date  . Respiratory failure   . Dysphagia   . Contracture of hand  joint   . Anemia   . Neurogenic bladder   . Anxiety   . Schizophrenia   . Quadriplegia   . Hypertension   . Pneumonia     Medications:  Prescriptions prior to admission  Medication Sig Dispense Refill  . aspirin 325 MG tablet Take 325 mg by mouth daily.      . calcium-vitamin D (OSCAL 500/200 D-3) 500-200 MG-UNIT per tablet Take 1 tablet by mouth every morning.      . cefTAZidime (FORTAZ) 1 G SOLR Inject 1 g into the vein every 8 (eight) hours. Until 03/17/12 Pneumonia pyelonephritis      . cloNIDine (CATAPRES) 0.2 MG tablet Take 0.2 mg by mouth 2 (two) times daily.      Marland Kitchen docusate sodium (COLACE) 100 MG capsule Take 100 mg by mouth 2 (two) times daily.      . feeding supplement (PRO-STAT SUGAR FREE 64) LIQD Take 30 mLs by mouth 3 (three) times daily with meals.      . ferrous sulfate 325 (65 FE) MG tablet  Take 325 mg by mouth daily with breakfast.      . Flora-Q (FLORA-Q) CAPS Take 1 capsule by mouth daily.      . fluticasone (FLONASE) 50 MCG/ACT nasal spray Place 1 spray into the nose daily.      . furosemide (LASIX) 20 MG tablet Take 20 mg by mouth daily.      Marland Kitchen guaiFENesin (ROBITUSSIN) 100 MG/5ML SOLN Take 15 mLs by mouth every 8 (eight) hours. scheduled      . Heparin & NaCl Lock Flush (HEPARIN SODIUM FLUSH IV) Inject 3 mLs into the vein 3 (three) times daily. After iv antibiotic infusion      . hydroxypropyl methylcellulose (ISOPTO TEARS) 2.5 % ophthalmic solution Place 1 drop into both eyes every 4 (four) hours as needed. Dry eyes      . ipratropium (ATROVENT HFA) 17 MCG/ACT inhaler Inhale 1 puff into the lungs every 6 (six) hours. Wheezing or shortness of breath      . Melatonin 5 MG CAPS Take 1 capsule by mouth at bedtime.      Marland Kitchen OLANZapine (ZYPREXA) 10 MG tablet Take 10 mg by mouth at bedtime.      Marland Kitchen oxyCODONE (ROXICODONE) 15 MG immediate release tablet Take 15 mg by mouth every 6 (six) hours as needed. A.M. Scheduled dose 0900 May have  Additional dose every 6 hours if needed for pain      . pantoprazole sodium (PROTONIX) 40 mg/20 mL PACK Take 40 mg by mouth daily. Granule pack      . polyethylene glycol (MIRALAX / GLYCOLAX) packet Take 17 g by mouth daily as needed. constipation      . pramipexole (MIRAPEX) 0.125 MG tablet Take 0.125 mg by mouth at bedtime.      . Sodium Chloride Flush (SALINE FLUSH IV) Inject 10 mLs into the vein 3 (three) times daily. Before and after iv antibiotics      . traZODone (DESYREL) 150 MG tablet Take 150 mg by mouth at bedtime.      Marland Kitchen venlafaxine (EFFEXOR) 100 MG tablet Take 100 mg by mouth 2 (two) times daily.       Assessment: Ms. Biehler is known to pharmacy from prior monitoring for [facility] for suspected HCAP vs. Aspiration PNA. Noted patient is afebrile, but WBC increased from 10.5 yesterday to 12 today. Scr remains stable- although with quadriplegia,  this is not really as reliable an assessment of  renal function as UOP, which is good. Noted CXR c/w fluid in lungs, concerning for aspiration per MD assessment. Pt has been admitted > 90- days ago, but is a SNF resident PTA.  1/26 Unasyn x1 1/26 Zosyn x1 1/26 Fortaz >>1/27 1/28: Vanc>> 1/28: Cefepime>> 1/28: Levaquin>>  1/27 Trach asp: ngtd 1/26: Blood: ngtd 1/26: MRSA pcr: positive  Goal of Therapy:  Vancomycin trough level 15-20 mcg/ml  Plan:  - Continue Cefepime and Levaquin at current doses, noted 8-day stop dates in place. - Will give Vancomycin 2 gm IV now, then 1 gm IV q 12h, will plan for 8-day course as ordered per MD. - Will monitor cx/spec/sens, renal fn and clinical status daily. - Will check Css Vanc trough as clinically appropriate given  hzx of quadriplegia.  Thanks, Marijayne Rauth K. Allena Katz, PharmD, BCPS.  Clinical Pharmacist Pager [phone]. 03/11/2012 6:29 PM
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
## 2012-03-12 LAB — CBC WITH DIFFERENTIAL/PLATELET
Basophils Relative: 0 % (ref 0–1)
Eosinophils Absolute: 0.4 10*3/uL (ref 0.0–0.7)
Hemoglobin: 10 g/dL — ABNORMAL LOW (ref 12.0–15.0)
MCH: 24.4 pg — ABNORMAL LOW (ref 26.0–34.0)
MCHC: 30.3 g/dL (ref 30.0–36.0)
Monocytes Relative: 7 % (ref 3–12)
Neutrophils Relative %: 63 % (ref 43–77)

## 2012-03-12 MED ORDER — METOPROLOL TARTRATE 12.5 MG HALF TABLET
12.5000 mg | ORAL_TABLET | Freq: Two times a day (BID) | ORAL | Status: DC
  Filled 2012-03-12 (×2): qty 1

## 2012-03-12 MED ORDER — METOPROLOL TARTRATE 12.5 MG HALF TABLET
12.5000 mg | ORAL_TABLET | Freq: Two times a day (BID) | ORAL | Status: DC
  Administered 2012-03-12 – 2012-03-14 (×5): 12.5 mg via ORAL
  Filled 2012-03-12 (×6): qty 1

## 2012-03-12 MED ORDER — LORAZEPAM 0.5 MG PO TABS
1.0000 mg | ORAL_TABLET | Freq: Once | ORAL | Status: AC
  Administered 2012-03-12: 1 mg via ORAL
  Filled 2012-03-12: qty 2

## 2012-03-12 NOTE — Progress Notes (Signed)
Patient agitated.  MD aware.  Ativan ordered and given x1. RN will continue to monitor. Louretta Parma, RN

## 2012-03-12 NOTE — Progress Notes (Signed)
Patient c/o difficulty breathing.  Suctioning done via trach x 3.  Patient stated relief. Will continue to monitor. Louretta Parma, RN

## 2012-03-12 NOTE — Progress Notes (Signed)
Patient ID: [name]  female  WUJ:[id number]    DOB: 1965-09-26    DOA: 03/08/2012  PCP: Terald Sleeper, MD  Assessment/Plan: Principal Problem:  *Acute respiratory failure with hypoxia Active Problems:  Healthcare-associated pneumonia  Anemia of chronic disease  Tracheostomy in place  Neurogenic bladder with chronic Foley catheter  Quadriplegia  Schizophrenia  1. Acute on chronic Resp failure- secondary to HCAP vs aspiration PNA:  Hypoxic at 86% on admission, has permanent trach,  2. HCAP vs Aspiration PNA - Leukocytosis resolved, cont pulmonary toileting and suctioning  - Neg Blood cult from 1/26, follow sputum culture results  - Cont Vanc, levaquin and cefepime 3. Unlikely Cardiac CP-This is chronic-monitor. No further workup-EKG's neg  4. Likely GERD vs Pleurisy- cont sucralfate, PPI 5. Neurogenic bladder-Stable. Needs q2-4 weeks catheter changes 6. Quadriplegia-PT/OT Consult appreciated-No acute needs 7. AOCD-Hb stable 8. HTN-Continue Clonidine 0.2 bid, Lasix 20 po daily, add metoprolol 9. COPD-continue Albuterol and atrovent, pulmonary toileting and suctioning  10. Depression- cont Trazadone 150 qhs, Olnazipine 10 mg qhs, Venlafaxine 100 bid   DVT Prophylaxis: lovenox  Code Status: FC  Disposition: hopefully by Friday, will need to continue IV antibiotics for additional 2 days     Subjective: Feels cold, otherwise no specific complaints, trach  Objective: Weight change: -5.491 kg (-12 lb 1.7 oz)  Intake/Output Summary (Last 24 hours) at 03/12/12 1113 Last data filed at 03/12/12 1007  Gross per 24 hour  Intake   1340 ml  Output   4126 ml  Net  -2786 ml   Blood pressure 168/93, pulse 88, temperature 98.4 F (36.9 C), temperature source Oral, resp. rate 20, height 5\' 4"  (1.626 m), weight 98.7 kg (217 lb 9.5 oz), SpO2 100.00%.  Physical Exam: General: Alert and awake, oriented x3, trach+ HEENT: anicteric sclera, pupils reactive to light and  accommodation, EOMI CVS: S1-S2 clear, no murmur rubs or gallops Chest: clear to auscultation bilaterally, no wheezing, rales or rhonchi Abdomen: soft nontender, normal bowel sounds, Gtube Extremities: no cyanosis, clubbing or edema noted bilaterally Neuro: paraplegia, contractures  Lab Results: Basic Metabolic Panel:  Lab 03/11/12 9562 03/08/12 2359  NA 137 139  K 4.2 4.3  CL 98 101  CO2 31 32  GLUCOSE 111* 92  BUN 19 17  CREATININE 0.78 0.97  CALCIUM 9.3 8.9  MG -- --  PHOS -- --   CBC:  Lab 03/12/12 0521 03/11/12 0500  WBC 10.2 12.0*  NEUTROABS 6.4 --  HGB 10.0* 10.2*  HCT 33.0* 33.4*  MCV 80.5 78.4  PLT 201 208   Cardiac Enzymes:  Lab 03/10/12 0047 03/09/12 2005 03/09/12 1247  CKTOTAL -- -- --  CKMB -- -- --  CKMBINDEX -- -- --  TROPONINI <0.30 <0.30 <0.30   BNP: No components found with this basename: POCBNP:2 CBG: No results found for this basename: GLUCAP:5 in the last 168 hours   Micro Results: Recent Results (from the past 240 hour(s))  CULTURE, BLOOD (ROUTINE X 2)     Status: Normal (Preliminary result)   Collection Time   03/09/12 12:25 AM      Component Value Range Status Comment   Specimen Description BLOOD RIGHT HAND   Final    Special Requests BOTTLES DRAWN AEROBIC AND ANAEROBIC 10CC EACH   Final    Culture  Setup Time 03/09/2012 15:02   Final    Culture     Final    Value:        BLOOD CULTURE RECEIVED NO GROWTH  TO DATE CULTURE WILL BE HELD FOR 5 DAYS BEFORE ISSUING A FINAL NEGATIVE REPORT   Report Status PENDING   Incomplete   CULTURE, BLOOD (ROUTINE X 2)     Status: Normal (Preliminary result)   Collection Time   03/09/12 12:35 AM      Component Value Range Status Comment   Specimen Description BLOOD LEFT HAND   Final    Special Requests BOTTLES DRAWN AEROBIC AND ANAEROBIC 10CC EACH   Final    Culture  Setup Time 03/09/2012 15:02   Final    Culture     Final    Value:        BLOOD CULTURE RECEIVED NO GROWTH TO DATE CULTURE WILL BE HELD  FOR 5 DAYS BEFORE ISSUING A FINAL NEGATIVE REPORT   Report Status PENDING   Incomplete   MRSA PCR SCREENING     Status: Abnormal   Collection Time   03/09/12  5:27 AM      Component Value Range Status Comment   MRSA by PCR POSITIVE (*) NEGATIVE Final   CULTURE, RESPIRATORY     Status: Normal (Preliminary result)   Collection Time   03/10/12  2:58 PM      Component Value Range Status Comment   Specimen Description TRACHEAL ASPIRATE   Final    Special Requests NONE   Final    Gram Stain     Final    Value: ABUNDANT WBC PRESENT,BOTH PMN AND MONONUCLEAR     MODERATE SQUAMOUS EPITHELIAL CELLS PRESENT     MODERATE GRAM POSITIVE RODS     FEW GRAM NEGATIVE RODS   Culture Culture reincubated for better growth   Final    Report Status PENDING   Incomplete     Studies/Results: Dg Chest Port 1 View  03/11/2012  *RADIOLOGY REPORT*  Clinical Data: Acute respiratory failure.  PORTABLE CHEST - 1 VIEW  Comparison: 03/08/2012 and 12/11/2011  Findings: Tracheostomy tube and Port-A-Cath in place, unchanged.  Heart size and pulmonary vascularity are normal.  Increased haziness at the right lung base could represent an infiltrate or posterior effusion.  Slight linear atelectasis at the right base is unchanged.  Improved atelectasis at the left lung base.  IMPRESSION:  1.  Improved atelectasis at the left base. 2.  Increased haziness at the right base which could represent an area of infiltrate or posterior effusion.   Original Report Authenticated By: Francene Boyers, M.D.    Dg Chest Port 1 View  03/08/2012  *RADIOLOGY REPORT*  Clinical Data: Chest pain and shortness of breath.  The patient states she has aspirated food.  PORTABLE CHEST - 1 VIEW  Comparison: Chest radiograph performed 12/11/2011  Findings: The patient's tracheostomy tube is seen ending 6-7 cm above the carina.  Small bilateral pleural effusions are seen, with associated bibasilar airspace opacities.  Given clinical concern, aspiration cannot be  excluded.  There is persistent right lateral pleural thickening.  Mild underlying vascular congestion is seen.  No pneumothorax is identified.  The cardiomediastinal silhouette is mildly enlarged.  No acute osseous abnormalities are seen.  A left-sided chest port is noted ending about the mid SVC.  IMPRESSION:  1.  Small bilateral pleural effusions, new from the prior study, with associated bibasilar airspace opacification.  Given clinical concern, aspiration cannot be excluded. 2.  Mild underlying vascular congestion and mild cardiomegaly; chronic right lateral pleural thickening noted.   Original Report Authenticated By: Tonia Ghent, M.D.     Medications: Scheduled Meds:   .  acidophilus  1 capsule Oral Daily  . albuterol  2.5 mg Nebulization Q6H  . antiseptic oral rinse  15 mL Mouth Rinse BID  . aspirin  325 mg Oral Daily  . ceFEPime (MAXIPIME) IV  1 g Intravenous Q8H  . Chlorhexidine Gluconate Cloth  6 each Topical Q0600  . cloNIDine  0.2 mg Oral BID  . enoxaparin (LOVENOX) injection  40 mg Subcutaneous Q24H  . feeding supplement  1 Container Oral Daily  . feeding supplement  30 mL Oral TID WC  . furosemide  20 mg Oral Daily  . guaiFENesin  15 mL Oral Q8H  . ipratropium  0.5 mg Nebulization Q6H  . levofloxacin (LEVAQUIN) IV  750 mg Intravenous Q24H  . mupirocin ointment  1 application Nasal BID  . OLANZapine  10 mg Oral QHS  . pantoprazole sodium  40 mg Oral Daily  . polyethylene glycol  17 g Oral BID  . sodium chloride  10-40 mL Intracatheter Q12H  . sodium chloride  3 mL Intravenous Q12H  . sucralfate  1 g Oral TID WC & HS  . traMADol  50 mg Oral TID  . traZODone  150 mg Oral QHS  . vancomycin  1,000 mg Intravenous Q12H  . venlafaxine  100 mg Oral BID WC      LOS: 4 days   RAI,RIPUDEEP M.D. [facility] 03/12/2012, 11:13 AM Pager: [phone]  If 7PM-7AM, please contact night-coverage [email or web address] Password [email or web address]

## 2012-03-12 NOTE — Progress Notes (Signed)
Pt's went up to 141 while being suctioned.  Pt asymptomatic.  BP 135/83, P85.  Dr. Isidoro Donning notified.  Awaiting return call.  Will continue to monitor.   Amanda Pea, Charity fundraiser.

## 2012-03-12 NOTE — Progress Notes (Signed)
Called and verify with Dr. Isidoro Donning that message sent for pt's HR 141 & at this time was being suctioned & pt  Asmptomatic.  No new orders given.  Will continue to monitor.  Amanda Pea, Charity fundraiser.

## 2012-03-13 DIAGNOSIS — N319 Neuromuscular dysfunction of bladder, unspecified: Secondary | ICD-10-CM

## 2012-03-13 DIAGNOSIS — R0902 Hypoxemia: Secondary | ICD-10-CM

## 2012-03-13 DIAGNOSIS — G8250 Quadriplegia, unspecified: Secondary | ICD-10-CM

## 2012-03-13 LAB — CULTURE, RESPIRATORY W GRAM STAIN

## 2012-03-13 MED ORDER — FLEET ENEMA 7-19 GM/118ML RE ENEM
1.0000 | ENEMA | Freq: Once | RECTAL | Status: AC
  Administered 2012-03-13: 1 via RECTAL
  Filled 2012-03-13: qty 1

## 2012-03-13 MED ORDER — IPRATROPIUM BROMIDE 0.02 % IN SOLN
0.5000 mg | Freq: Three times a day (TID) | RESPIRATORY_TRACT | Status: DC
  Administered 2012-03-14 (×2): 0.5 mg via RESPIRATORY_TRACT
  Filled 2012-03-13 (×2): qty 2.5

## 2012-03-13 MED ORDER — ALBUTEROL SULFATE (5 MG/ML) 0.5% IN NEBU
2.5000 mg | INHALATION_SOLUTION | Freq: Three times a day (TID) | RESPIRATORY_TRACT | Status: DC
  Administered 2012-03-14 (×2): 2.5 mg via RESPIRATORY_TRACT
  Filled 2012-03-13 (×2): qty 0.5

## 2012-03-13 MED ORDER — DOCUSATE SODIUM 50 MG/5ML PO LIQD
100.0000 mg | Freq: Two times a day (BID) | ORAL | Status: DC
  Administered 2012-03-13 – 2012-03-14 (×3): 100 mg via ORAL
  Filled 2012-03-13 (×4): qty 10

## 2012-03-13 NOTE — Progress Notes (Signed)
Utilization Review Completed.   Tymara Saur, RN, BSN Nurse Case Manager  [phone]  

## 2012-03-13 NOTE — Progress Notes (Signed)
Patient ID: [name]  female  ZOX:[id number]    DOB: 07/26/1965    DOA: 03/08/2012  PCP: Terald Sleeper, MD  Assessment/Plan:   1. Acute on chronic Resp failure- secondary to HCAP vs aspiration PNA:  Hypoxic at 86% on admission, has permanent trach, IMPROVING 2. HCAP vs Aspiration PNA - Leukocytosis resolved, cont pulmonary toileting and suctioning  - Neg Blood cult from 1/26, follow sputum culture results  - Cont Vanc, levaquin and cefepime today, narrow to levaquin tomorrow 3. Unlikely Cardiac CP-This is chronic-monitor. No further workup-EKG's neg  4. Likely GERD vs Pleurisy- cont sucralfate, PPI 5. Neurogenic bladder-Stable. Needs q2-4 weeks catheter changes 6. Quadriplegia-PT/OT Consult appreciated-No acute needs 7. AOCD-Hb stable 8. HTN-Continue Clonidine 0.2 bid, Lasix 20 po daily, add metoprolol 9. COPD-continue Albuterol and atrovent, pulmonary toileting and suctioning  10. Depression- cont Trazadone 150 qhs, Olnazipine 10 mg qhs, Venlafaxine 100 bid 11. CONSTIPATION: States does not remember, when she had a BM, will place on colace and enema today   DVT Prophylaxis: lovenox  Code Status: FC  Disposition: continue IV antibiotics today, transition to oral tomorrow, DC in AM     Subjective: Feels better overall but constipated otherwise no specific complaints, trach, tolerates regular diet   Objective: Weight change: 1.4 kg (3 lb 1.4 oz)  Intake/Output Summary (Last 24 hours) at 03/13/12 1319 Last data filed at 03/13/12 1316  Gross per 24 hour  Intake 3071.17 ml  Output   3150 ml  Net -78.83 ml   Blood pressure 136/84, pulse 78, temperature 99 F (37.2 C), temperature source Oral, resp. rate 16, height 5\' 4"  (1.626 m), weight 100.1 kg (220 lb 10.9 oz), SpO2 100.00%.  Physical Exam: General: Alert and awake, oriented x3, trach+ HEENT:PERLA, trach CVS: S1-S2 clear, no murmur rubs or gallops Chest: clear to auscultation bilaterally  anteriorly Abdomen: soft nontender, normal bowel sounds, Gtube Extremities: no cyanosis, clubbing or edema noted bilaterally Neuro: paraplegia, contractures  Lab Results: Basic Metabolic Panel:  Lab 03/11/12 8119 03/08/12 2359  NA 137 139  K 4.2 4.3  CL 98 101  CO2 31 32  GLUCOSE 111* 92  BUN 19 17  CREATININE 0.78 0.97  CALCIUM 9.3 8.9  MG -- --  PHOS -- --   CBC:  Lab 03/12/12 0521 03/11/12 0500  WBC 10.2 12.0*  NEUTROABS 6.4 --  HGB 10.0* 10.2*  HCT 33.0* 33.4*  MCV 80.5 78.4  PLT 201 208   Cardiac Enzymes:  Lab 03/10/12 0047 03/09/12 2005 03/09/12 1247  CKTOTAL -- -- --  CKMB -- -- --  CKMBINDEX -- -- --  TROPONINI <0.30 <0.30 <0.30   BNP: No components found with this basename: POCBNP:2 CBG: No results found for this basename: GLUCAP:5 in the last 168 hours   Micro Results: Recent Results (from the past 240 hour(s))  CULTURE, BLOOD (ROUTINE X 2)     Status: Normal (Preliminary result)   Collection Time   03/09/12 12:25 AM      Component Value Range Status Comment   Specimen Description BLOOD RIGHT HAND   Final    Special Requests BOTTLES DRAWN AEROBIC AND ANAEROBIC 10CC EACH   Final    Culture  Setup Time 03/09/2012 15:02   Final    Culture     Final    Value:        BLOOD CULTURE RECEIVED NO GROWTH TO DATE CULTURE WILL BE HELD FOR 5 DAYS BEFORE ISSUING A FINAL NEGATIVE REPORT   Report Status  PENDING   Incomplete   CULTURE, BLOOD (ROUTINE X 2)     Status: Normal (Preliminary result)   Collection Time   03/09/12 12:35 AM      Component Value Range Status Comment   Specimen Description BLOOD LEFT HAND   Final    Special Requests BOTTLES DRAWN AEROBIC AND ANAEROBIC 10CC EACH   Final    Culture  Setup Time 03/09/2012 15:02   Final    Culture     Final    Value:        BLOOD CULTURE RECEIVED NO GROWTH TO DATE CULTURE WILL BE HELD FOR 5 DAYS BEFORE ISSUING A FINAL NEGATIVE REPORT   Report Status PENDING   Incomplete   MRSA PCR SCREENING     Status:  Abnormal   Collection Time   03/09/12  5:27 AM      Component Value Range Status Comment   MRSA by PCR POSITIVE (*) NEGATIVE Final   CULTURE, RESPIRATORY     Status: Normal   Collection Time   03/10/12  2:58 PM      Component Value Range Status Comment   Specimen Description TRACHEAL ASPIRATE   Final    Special Requests NONE   Final    Gram Stain     Final    Value: ABUNDANT WBC PRESENT,BOTH PMN AND MONONUCLEAR     MODERATE SQUAMOUS EPITHELIAL CELLS PRESENT     MODERATE GRAM POSITIVE RODS     FEW GRAM NEGATIVE RODS   Culture Non-Pathogenic Oropharyngeal-type Flora Isolated.   Final    Report Status 03/13/2012 FINAL   Final     Studies/Results: Dg Chest Port 1 View  03/11/2012  *RADIOLOGY REPORT*  Clinical Data: Acute respiratory failure.  PORTABLE CHEST - 1 VIEW  Comparison: 03/08/2012 and 12/11/2011  Findings: Tracheostomy tube and Port-A-Cath in place, unchanged.  Heart size and pulmonary vascularity are normal.  Increased haziness at the right lung base could represent an infiltrate or posterior effusion.  Slight linear atelectasis at the right base is unchanged.  Improved atelectasis at the left lung base.  IMPRESSION:  1.  Improved atelectasis at the left base. 2.  Increased haziness at the right base which could represent an area of infiltrate or posterior effusion.   Original Report Authenticated By: Francene Boyers, M.D.    Dg Chest Port 1 View  03/08/2012  *RADIOLOGY REPORT*  Clinical Data: Chest pain and shortness of breath.  The patient states she has aspirated food.  PORTABLE CHEST - 1 VIEW  Comparison: Chest radiograph performed 12/11/2011  Findings: The patient's tracheostomy tube is seen ending 6-7 cm above the carina.  Small bilateral pleural effusions are seen, with associated bibasilar airspace opacities.  Given clinical concern, aspiration cannot be excluded.  There is persistent right lateral pleural thickening.  Mild underlying vascular congestion is seen.  No pneumothorax  is identified.  The cardiomediastinal silhouette is mildly enlarged.  No acute osseous abnormalities are seen.  A left-sided chest port is noted ending about the mid SVC.  IMPRESSION:  1.  Small bilateral pleural effusions, new from the prior study, with associated bibasilar airspace opacification.  Given clinical concern, aspiration cannot be excluded. 2.  Mild underlying vascular congestion and mild cardiomegaly; chronic right lateral pleural thickening noted.   Original Report Authenticated By: Tonia Ghent, M.D.     Medications: Scheduled Meds:    . acidophilus  1 capsule Oral Daily  . albuterol  2.5 mg Nebulization Q6H  . antiseptic oral  rinse  15 mL Mouth Rinse BID  . aspirin  325 mg Oral Daily  . ceFEPime (MAXIPIME) IV  1 g Intravenous Q8H  . cloNIDine  0.2 mg Oral BID  . enoxaparin (LOVENOX) injection  40 mg Subcutaneous Q24H  . feeding supplement  1 Container Oral Daily  . feeding supplement  30 mL Oral TID WC  . furosemide  20 mg Oral Daily  . guaiFENesin  15 mL Oral Q8H  . ipratropium  0.5 mg Nebulization Q6H  . levofloxacin (LEVAQUIN) IV  750 mg Intravenous Q24H  . metoprolol tartrate  12.5 mg Oral BID  . mupirocin ointment  1 application Nasal BID  . OLANZapine  10 mg Oral QHS  . pantoprazole sodium  40 mg Oral Daily  . polyethylene glycol  17 g Oral BID  . sodium chloride  10-40 mL Intracatheter Q12H  . sodium chloride  3 mL Intravenous Q12H  . sucralfate  1 g Oral TID WC & HS  . traMADol  50 mg Oral TID  . traZODone  150 mg Oral QHS  . vancomycin  1,000 mg Intravenous Q12H  . venlafaxine  100 mg Oral BID WC      LOS: 5 days   Ronan Dion M.D. [facility] 03/13/2012, 1:19 PM Pager: [phone]  If 7PM-7AM, please contact night-coverage [email or web address] Password [email or web address]

## 2012-03-13 NOTE — Progress Notes (Signed)
Met with Dr. Isidoro Donning today. DC anticipated for tomorrow.  Notified patient and her sister Albana Saperstein of same. CSW will monitor.  Lorri Frederick. [place]  [phone]

## 2012-03-13 NOTE — Clinical Social Work Psychosocial (Addendum)
    Clinical Social Work Department BRIEF PSYCHOSOCIAL ASSESSMENT 03/13/2012  Patient:  LAVAUGHN, BISIG     Account Number:  [id number]     Admit date:  03/08/2012  Clinical Social Worker:  Tiburcio Pea  Date/Time:  03/10/2012 01:00 PM  Referred by:  Physician  Date Referred:  03/10/2012 Referred for  SNF Placement   Other Referral:   Interview type:  Patient Other interview type:    PSYCHOSOCIAL DATA Living Status:  FACILITY Admitted from facility:  [facility] Level of care:  Skilled Nursing Facility Primary support name:  Gardiner Rhyme Primary support relationship to patient:  SIBLING Degree of support available:   Strong support    CURRENT CONCERNS Current Concerns  Post-Acute Placement   Other Concerns:   Return to SNF    SOCIAL WORK ASSESSMENT / PLAN 47 year old female- resident of [facility] and [address] Met with patient- she wants to return to facility when stable. Contacted Rhonda- Admissions at [facility]. - bed will be available for patient when medically stable. Fl2 placed on chart for MD's signature.  CSW will monitor for date of stability   Assessment/plan status:  Psychosocial Support/Ongoing Assessment of Needs Other assessment/ plan:   Information/referral to community resources:   Patient denied any questions referral needs at this time.    PATIENT'S/FAMILY'S RESPONSE TO PLAN OF CARE: Patient wants to return to [facility] when medically stable. She wants her sister called when she is able to be d/c'd back to facility.  CSW will monitor and assist with any d/c needs.

## 2012-03-14 DIAGNOSIS — B965 Pseudomonas (aeruginosa) (mallei) (pseudomallei) as the cause of diseases classified elsewhere: Secondary | ICD-10-CM

## 2012-03-14 MED ORDER — IPRATROPIUM BROMIDE 0.02 % IN SOLN: 0.5000 mg | Freq: Three times a day (TID) | RESPIRATORY_TRACT | Status: DC

## 2012-03-14 MED ORDER — SUCRALFATE 1 G PO TABS: 1.0000 g | ORAL_TABLET | Freq: Three times a day (TID) | ORAL | Status: AC

## 2012-03-14 MED ORDER — LEVOFLOXACIN 750 MG PO TABS
750.0000 mg | ORAL_TABLET | Freq: Every day | ORAL | Status: DC
  Filled 2012-03-14: qty 1

## 2012-03-14 MED ORDER — METOPROLOL TARTRATE 12.5 MG HALF TABLET: 12.5000 mg | ORAL_TABLET | Freq: Two times a day (BID) | ORAL | Status: DC

## 2012-03-14 MED ORDER — HEPARIN SOD (PORK) LOCK FLUSH 100 UNIT/ML IV SOLN
500.0000 [IU] | INTRAVENOUS | Status: AC | PRN
  Administered 2012-03-14: 500 [IU]

## 2012-03-14 MED ORDER — OXYCODONE HCL 15 MG PO TABS: 15.0000 mg | ORAL_TABLET | Freq: Four times a day (QID) | ORAL | Status: DC | PRN

## 2012-03-14 MED ORDER — LEVOFLOXACIN 750 MG PO TABS: 750.0000 mg | ORAL_TABLET | Freq: Every day | ORAL | Status: DC

## 2012-03-14 MED ORDER — TRAMADOL HCL 50 MG PO TABS: 50.0000 mg | ORAL_TABLET | Freq: Three times a day (TID) | ORAL | Status: DC

## 2012-03-14 MED ORDER — ALBUTEROL SULFATE (5 MG/ML) 0.5% IN NEBU: 2.5000 mg | INHALATION_SOLUTION | Freq: Three times a day (TID) | RESPIRATORY_TRACT | Status: DC

## 2012-03-14 MED ORDER — IPRATROPIUM BROMIDE HFA 17 MCG/ACT IN AERS: 1.0000 | INHALATION_SPRAY | RESPIRATORY_TRACT | Status: DC | PRN

## 2012-03-14 MED ORDER — DEXTROSE 5 % IV SOLN
1.0000 g | Freq: Two times a day (BID) | INTRAVENOUS | Status: DC
  Filled 2012-03-14: qty 1
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
## 2012-03-14 NOTE — Progress Notes (Signed)
OK per MD for d/c today back to [facility]. Ok per SNF, patient, her sister and pt's nurse- [place].  EMS to transport at 1:00.  No further CSW needs indicated. Patient and sister are very pleased with d/c plan.  Lorri Frederick. [place]  [phone]

## 2012-03-14 NOTE — Progress Notes (Signed)
ANTIBIOTIC CONSULT NOTE - FOLLOW UP  Pharmacy Consult for Vancomycin Indication: pneumonia  Allergies  Allergen Reactions  . Latex Other (See Comments)    Reaction unknown    Patient Measurements: Height: 5\' 4"  (162.6 cm) Weight: 220 lb 10.9 oz (100.1 kg) IBW/kg (Calculated) : 54.7  Adjusted Body Weight:   Vital Signs: Temp: 99 F (37.2 C) (01/31 0630) Temp src: Oral (01/31 0630) BP: 130/75 mmHg (01/31 0630) Pulse Rate: 75  (01/31 0630) Intake/Output from previous day: 01/30 0701 - 01/31 0700 In: 830 [P.O.:360; I.V.:120; IV Piggyback:350] Out: 2875 [Urine:2875] Intake/Output from this shift:    Labs:  [facility] 03/12/12 0521  WBC 10.2  HGB 10.0*  PLT 201  LABCREA --  CREATININE --   Estimated Creatinine Clearance: 100 ml/min (by C-G formula based on Cr of 0.78).  Basename 03/14/12 0626  VANCOTROUGH 42.9*  VANCOPEAK --  Drue Dun --  GENTTROUGH --  GENTPEAK --  GENTRANDOM --  TOBRATROUGH --  Nolen Mu --  TOBRARND --  AMIKACINPEAK --  AMIKACINTROU --  AMIKACIN --     Microbiology: Recent Results (from the past 720 hour(s))  CULTURE, BLOOD (ROUTINE X 2)     Status: Normal (Preliminary result)   Collection Time   03/09/12 12:25 AM      Component Value Range Status Comment   Specimen Description BLOOD RIGHT HAND   Final    Special Requests BOTTLES DRAWN AEROBIC AND ANAEROBIC 10CC EACH   Final    Culture  Setup Time 03/09/2012 15:02   Final    Culture     Final    Value:        BLOOD CULTURE RECEIVED NO GROWTH TO DATE CULTURE WILL BE HELD FOR 5 DAYS BEFORE ISSUING A FINAL NEGATIVE REPORT   Report Status PENDING   Incomplete   CULTURE, BLOOD (ROUTINE X 2)     Status: Normal (Preliminary result)   Collection Time   03/09/12 12:35 AM      Component Value Range Status Comment   Specimen Description BLOOD LEFT HAND   Final    Special Requests BOTTLES DRAWN AEROBIC AND ANAEROBIC 10CC EACH   Final    Culture  Setup Time 03/09/2012 15:02   Final    Culture      Final    Value:        BLOOD CULTURE RECEIVED NO GROWTH TO DATE CULTURE WILL BE HELD FOR 5 DAYS BEFORE ISSUING A FINAL NEGATIVE REPORT   Report Status PENDING   Incomplete   MRSA PCR SCREENING     Status: Abnormal   Collection Time   03/09/12  5:27 AM      Component Value Range Status Comment   MRSA by PCR POSITIVE (*) NEGATIVE Final   CULTURE, RESPIRATORY     Status: Normal   Collection Time   03/10/12  2:58 PM      Component Value Range Status Comment   Specimen Description TRACHEAL ASPIRATE   Final    Special Requests NONE   Final    Gram Stain     Final    Value: ABUNDANT WBC PRESENT,BOTH PMN AND MONONUCLEAR     MODERATE SQUAMOUS EPITHELIAL CELLS PRESENT     MODERATE GRAM POSITIVE RODS     FEW GRAM NEGATIVE RODS   Culture Non-Pathogenic Oropharyngeal-type Flora Isolated.   Final    Report Status 03/13/2012 FINAL   Final     Anti-infectives     Start     Dose/Rate Route  Frequency Ordered Stop   03/11/12 1830   levofloxacin (LEVAQUIN) IVPB 750 mg        750 mg 100 mL/hr over 90 Minutes Intravenous Every 24 hours 03/11/12 1733 03/13/12 1758   03/11/12 1830   vancomycin (VANCOCIN) 2,000 mg in sodium chloride 0.9 % 500 mL IVPB        2,000 mg 250 mL/hr over 120 Minutes Intravenous  Once 03/11/12 1818 03/11/12 2044   03/11/12 1800   ceFEPIme (MAXIPIME) 1 g in dextrose 5 % 50 mL IVPB        1 g 100 mL/hr over 30 Minutes Intravenous 3 times per day 03/11/12 1733 03/19/12 2159   03/11/12 0600   vancomycin (VANCOCIN) IVPB 1000 mg/200 mL premix        1,000 mg 200 mL/hr over 60 Minutes Intravenous Every 12 hours 03/11/12 1818 03/18/12 1759   03/10/12 1030   Ampicillin-Sulbactam (UNASYN) 3 g in sodium chloride 0.9 % 100 mL IVPB  Status:  Discontinued        3 g 100 mL/hr over 60 Minutes Intravenous Every 6 hours 03/10/12 0937 03/11/12 1733   03/09/12 0600   cefTAZidime (FORTAZ) injection 1 g  Status:  Discontinued        1 g Intravenous 3 times per day 03/09/12 0320 03/09/12  0343   03/09/12 0600   cefTAZidime (FORTAZ) 1 g in dextrose 5 % 50 mL IVPB  Status:  Discontinued        1 g 100 mL/hr over 30 Minutes Intravenous 3 times per day 03/09/12 0344 03/10/12 0937   03/09/12 0130   vancomycin (VANCOCIN) IVPB 1000 mg/200 mL premix  Status:  Discontinued        1,000 mg 200 mL/hr over 60 Minutes Intravenous  Once 03/09/12 0121 03/10/12 0937   03/09/12 0130   levofloxacin (LEVAQUIN) IVPB 750 mg  Status:  Discontinued        750 mg 100 mL/hr over 90 Minutes Intravenous  Once 03/09/12 0121 03/10/12 0937   03/09/12 0130  piperacillin-tazobactam (ZOSYN) IVPB 3.375 g       3.375 g 100 mL/hr over 30 Minutes Intravenous  Once 03/09/12 0121 03/09/12 0307   03/09/12 0015   Ampicillin-Sulbactam (UNASYN) 3 g in sodium chloride 0.9 % 100 mL IVPB        3 g 100 mL/hr over 60 Minutes Intravenous  Once 03/09/12 0000 03/09/12 0231          Assessment: Admit Complaint: known PNA, requiring O2; quadriplegic. From SNF with increased O2 requirements. Was supposed to start [place] today at [facility].  Anticoagulation: Lovenox 40 q24h. CBC stable. CrCl 100. Anemic but stable.  Infectious Disease: HCAP: CXR c/w fluid in lungs, concerning for aspiration per MD assessment. Pt has been admitted > 90- days ago, but is a SNF resident PTA. Afebrile. WBc 10.2.  1/28: Vanc>>  1/31 VT= 42.9 (dose not given), HOLD DOSES 1/28: Cefepime>> 1/28: Levaquin>>1/30 1/26 Unasyn x1 1/26 Zosyn x1 1/26 Fortaz >>1/27  1/27 Trach asp: Normal flora 1/26: Blood: ngtd 1/26: MRSA pcr: positive  Cardiovascular: HTN, CHF- 130/75, HR 75 on ASA 325mg , clonidine, furosemide, metoprolol,. Endocrinology: no hx, no insulin. Gluc at goal. Gastrointestinal / Nutrition: protonix, sucralfate, Miralax Neurology: Schizophrenia- Patient is quadraplegic. Gets agitated. Zyprexa, Venlafaxine, Trazadone, scheduled Tramdol. Nephrology: Scr 0.78 with CrCl 100 Pulmonary: COPD - albuterol, atrovent nebs,  guaifenasin Hematology / Oncology: h/h low, at baseline PTA Medication Issues: ferrous sulfate, Best Practices: Lovenox   Goal of  Therapy:  Vancomycin trough level 15-20 mcg/ml  Plan:  Change Cefepime to 1g IV q12h for decreased clearance as evidenced by Vanco levels. Hold Vancomycin and recheck level tomorrow morning. Keep stop date 2/5.  Merilynn Finland, [employer] 03/14/2012,8:19 AM

## 2012-03-14 NOTE — Progress Notes (Signed)
Patient critical vancomycin level of 42.9 this morning. Anastasia Fiedler RN

## 2012-03-15 LAB — CULTURE, BLOOD (ROUTINE X 2): Culture: NO GROWTH

## 2012-05-09 ENCOUNTER — Other Ambulatory Visit: Payer: Self-pay | Admitting: Orthopaedic Surgery

## 2012-05-09 DIAGNOSIS — M79662 Pain in left lower leg: Secondary | ICD-10-CM

## 2012-05-12 ENCOUNTER — Inpatient Hospital Stay: Admission: RE | Admit: 2012-05-12 | Payer: Medicare Other | Source: Ambulatory Visit

## 2012-05-13 ENCOUNTER — Ambulatory Visit
Admission: RE | Admit: 2012-05-13 | Discharge: 2012-05-13 | Disposition: A | Discharge status: Home / Self Care | Payer: Medicare Other | Source: Ambulatory Visit | Attending: Orthopaedic Surgery | Admitting: Orthopaedic Surgery

## 2012-05-13 DIAGNOSIS — M79662 Pain in left lower leg: Secondary | ICD-10-CM

## 2012-05-19 ENCOUNTER — Non-Acute Institutional Stay (SKILLED_NURSING_FACILITY): Payer: Medicare Other | Admitting: Internal Medicine

## 2012-05-19 DIAGNOSIS — J9610 Chronic respiratory failure, unspecified whether with hypoxia or hypercapnia: Secondary | ICD-10-CM

## 2012-05-19 DIAGNOSIS — D509 Iron deficiency anemia, unspecified: Secondary | ICD-10-CM

## 2012-05-19 DIAGNOSIS — I10 Essential (primary) hypertension: Secondary | ICD-10-CM

## 2012-05-19 DIAGNOSIS — E781 Pure hyperglyceridemia: Secondary | ICD-10-CM

## 2012-06-02 NOTE — Progress Notes (Signed)
Patient ID: [name], female   DOB: 1965/05/24, 47 y.o.   MRN: [id number]        PROGRESS NOTE  DATE:  05/19/2012  FACILITY: [facility]   LEVEL OF CARE: SNF  Routine Visit  CHIEF COMPLAINT:  Manage iron deficiency anemia, hypertriglyceridemia and hypertension.   HISTORY OF PRESENT ILLNESS:    REASSESSMENT OF ONGOING PROBLEM(S):  HYPERTRIGLYCERIDEMIA:   In 03/2012:  Triglycerides 258, otherwise fasting lipid panel normal.  No complications reported from the medications presently being used.    ANEMIA:  On 05/16/2012:  Patient's hemoglobin was 10.1, MCV 73.  On 04/28/2012:  Hemoglobin was 10.4.  The patient denies fatigue, melena or hematochezia. No complications from the medications currently being used.   HTN: Pt 's HTN remains stable.  Denies CP, sob, DOE, pedal edema, headaches, dizziness or visual disturbances.  No complications from the medications currently being used.  Last BP :  120/62, 114/62.  PAST MEDICAL HISTORY : Reviewed.  No changes.  CURRENT MEDICATIONS: Reviewed per [facility]  REVIEW OF SYSTEMS:  Difficult to obtain.  Patient is a poor historian today.    PHYSICAL EXAMINATION  VS:  T 98.9      P 70     RR 20     BP  120/62   POX %     WT (Lb) 215  GENERAL: no acute distress, morbidly obese body habitus EYES: conjunctivae normal, sclerae normal, normal eye lids NECK: patient has a trach LYMPHATICS: no LAN in the neck, no supraclavicular LAN RESPIRATORY: breathing is even & unlabored, BS CTAB CARDIAC: RRR, no murmur,no extra heart sounds EDEMA/VARICOSITIES:  +1 bilateral lower extremity edema  ARTERIAL:  pedal pulses nonpalpable  GI: abdomen soft, normal BS, no masses, no tenderness, no hepatomegaly, no splenomegaly PSYCHIATRIC: the patient is alert & oriented to person, affect & behavior appropriate  LABS/RADIOLOGY: In 05/2012:  Hemoglobin 10.1, MCV 73, otherwise CBC normal.    CMP normal.  In 04/2012:  Hemoglobin A1C  5.7.     ASSESSMENT/PLAN:  Hypertriglyceridemia.  Triglycerides are stabilizing.  Continue current medications.    Iron deficiency anemia.  Hemoglobin declined.  We will monitor.    Hypertension.  Well controlled.   Chronic respiratory failure.  Continue trach support.   Quadriplegia.  Continue supportive care.   Schizophrenia.  Risperdal was added.    GERD.  Continue Protonix.   Constipation.  Continue current laxatives.    Chronic pain.  Currently on routine Ultram.    CPT CODE: 40981

## 2012-06-16 ENCOUNTER — Non-Acute Institutional Stay (SKILLED_NURSING_FACILITY): Payer: Medicare Other | Admitting: Internal Medicine

## 2012-06-16 DIAGNOSIS — I10 Essential (primary) hypertension: Secondary | ICD-10-CM

## 2012-06-16 DIAGNOSIS — R0989 Other specified symptoms and signs involving the circulatory and respiratory systems: Secondary | ICD-10-CM

## 2012-06-16 DIAGNOSIS — D509 Iron deficiency anemia, unspecified: Secondary | ICD-10-CM

## 2012-06-16 DIAGNOSIS — E781 Pure hyperglyceridemia: Secondary | ICD-10-CM

## 2012-06-16 DIAGNOSIS — R0600 Dyspnea, unspecified: Secondary | ICD-10-CM

## 2012-06-20 NOTE — Progress Notes (Addendum)
Patient ID: [name], female   DOB: 02/02/1966, 47 y.o.   MRN: [id number]        PROGRESS NOTE  DATE:  06/16/2012  FACILITY: [facility]   LEVEL OF CARE: SNF  Routine Visit  CHIEF COMPLAINT:  Manage dyspnea, iron deficiency anemia, hypertriglyceridemia and hypertension.   HISTORY OF PRESENT ILLNESS:    REASSESSMENT OF ONGOING PROBLEM(S):  HYPERTRIGLYCERIDEMIA:   In 03/2012:  Triglycerides 258, otherwise fasting lipid panel normal.  No complications reported from the medications presently being used.    ANEMIA:  On 05/16/2012:  Patient's hemoglobin was 10.1, MCV 73.  On 04/28/2012:  Hemoglobin was 10.4.  The patient denies fatigue, melena or hematochezia. No complications from the medications currently being used.   HTN: Pt 's HTN remains stable.  Denies CP, sob, DOE, pedal edema, headaches, dizziness or visual disturbances.  No complications from the medications currently being used.  Last BP : 150/80,  120/62, 114/62.  DYSPNEA: New problem. Patient is complaining of shortness of breath today. She denies coughing fever chills or night sweats. She cannot identify precipitating or alleviating factors. There is no temporal relationship.  PAST MEDICAL HISTORY : Reviewed.  No changes.  CURRENT MEDICATIONS: Reviewed per [facility]  REVIEW OF SYSTEMS:  Difficult to obtain.  Patient is a poor historian today.    PHYSICAL EXAMINATION  VS:  T 98.3     P88     RR 18   BP  150/80   POX %95 on trach     WT (Lb) 210  GENERAL: no acute distress, morbidly obese body habitus EYES: conjunctivae normal, sclerae normal, normal eye lids NECK: patient has a trach LYMPHATICS: no LAN in the neck, no supraclavicular LAN RESPIRATORY: breathing is even & unlabored, BS CTAB CARDIAC: RRR, no murmur,no extra heart sounds EDEMA/VARICOSITIES:  +1 bilateral lower extremity edema  ARTERIAL:  pedal pulses nonpalpable  GI: abdomen soft, normal BS, no masses, no tenderness, no hepatomegaly, no  splenomegaly PSYCHIATRIC: the patient is alert & oriented to person, affect & behavior appropriate  LABS/RADIOLOGY: In 05/2012:  Hemoglobin 10.1, MCV 73, otherwise CBC normal.    CMP normal.  In 04/2012:  Hemoglobin A1C  5.7.    ASSESSMENT/PLAN: Dyspnea-new problem. Check chest x-ray.  Hypertriglyceridemia.  Triglycerides are stabilizing.  Continue current medications.    Iron deficiency anemia.  Hemoglobin declined.  We will monitor.    Hypertension.  Last BP elevated. We'll review a log.  Chronic respiratory failure.  Continue trach support.   Quadriplegia.  Continue supportive care.   Schizophrenia.  Stable.  GERD.  Continue Protonix.   Constipation.  Continue current laxatives.    Chronic pain.  Currently on routine Ultram.    CPT CODE: 78295

## 2012-06-21 ENCOUNTER — Non-Acute Institutional Stay (SKILLED_NURSING_FACILITY): Payer: Medicare Other | Admitting: Internal Medicine

## 2012-06-21 DIAGNOSIS — J9610 Chronic respiratory failure, unspecified whether with hypoxia or hypercapnia: Secondary | ICD-10-CM

## 2012-06-21 DIAGNOSIS — J189 Pneumonia, unspecified organism: Secondary | ICD-10-CM
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
## 2012-07-15 ENCOUNTER — Non-Acute Institutional Stay (SKILLED_NURSING_FACILITY): Payer: Medicare Other | Admitting: Internal Medicine

## 2012-07-15 DIAGNOSIS — F29 Unspecified psychosis not due to a substance or known physiological condition: Secondary | ICD-10-CM

## 2012-07-21 ENCOUNTER — Inpatient Hospital Stay (HOSPITAL_COMMUNITY)
Admission: EM | Admit: 2012-07-21 | Discharge: 2012-07-23 | DRG: 193 | Disposition: A | Discharge status: Skilled Nursing Facility | Payer: Medicare Other | Attending: Internal Medicine | Admitting: Internal Medicine

## 2012-07-21 ENCOUNTER — Encounter (HOSPITAL_COMMUNITY): Payer: Self-pay | Admitting: *Deleted

## 2012-07-21 ENCOUNTER — Emergency Department (HOSPITAL_COMMUNITY): Payer: Medicare Other

## 2012-07-21 ENCOUNTER — Non-Acute Institutional Stay (SKILLED_NURSING_FACILITY): Payer: Medicare Other | Admitting: Internal Medicine

## 2012-07-21 DIAGNOSIS — Z930 Tracheostomy status: Secondary | ICD-10-CM

## 2012-07-21 DIAGNOSIS — N319 Neuromuscular dysfunction of bladder, unspecified: Secondary | ICD-10-CM | POA: Diagnosis present

## 2012-07-21 DIAGNOSIS — D638 Anemia in other chronic diseases classified elsewhere: Secondary | ICD-10-CM

## 2012-07-21 DIAGNOSIS — Z87891 Personal history of nicotine dependence: Secondary | ICD-10-CM

## 2012-07-21 DIAGNOSIS — Z79899 Other long term (current) drug therapy: Secondary | ICD-10-CM

## 2012-07-21 DIAGNOSIS — R079 Chest pain, unspecified: Secondary | ICD-10-CM

## 2012-07-21 DIAGNOSIS — M24549 Contracture, unspecified hand: Secondary | ICD-10-CM | POA: Diagnosis present

## 2012-07-21 DIAGNOSIS — I10 Essential (primary) hypertension: Secondary | ICD-10-CM | POA: Diagnosis present

## 2012-07-21 DIAGNOSIS — R0609 Other forms of dyspnea: Secondary | ICD-10-CM

## 2012-07-21 DIAGNOSIS — A498 Other bacterial infections of unspecified site: Secondary | ICD-10-CM

## 2012-07-21 DIAGNOSIS — J189 Pneumonia, unspecified organism: Principal | ICD-10-CM | POA: Diagnosis present

## 2012-07-21 DIAGNOSIS — K5900 Constipation, unspecified: Secondary | ICD-10-CM | POA: Diagnosis present

## 2012-07-21 DIAGNOSIS — J9610 Chronic respiratory failure, unspecified whether with hypoxia or hypercapnia: Secondary | ICD-10-CM | POA: Diagnosis present

## 2012-07-21 DIAGNOSIS — J9601 Acute respiratory failure with hypoxia: Secondary | ICD-10-CM

## 2012-07-21 DIAGNOSIS — Z91040 Latex allergy status: Secondary | ICD-10-CM

## 2012-07-21 DIAGNOSIS — J9600 Acute respiratory failure, unspecified whether with hypoxia or hypercapnia: Secondary | ICD-10-CM

## 2012-07-21 DIAGNOSIS — R0600 Dyspnea, unspecified: Secondary | ICD-10-CM

## 2012-07-21 DIAGNOSIS — G8250 Quadriplegia, unspecified: Secondary | ICD-10-CM | POA: Diagnosis present

## 2012-07-21 DIAGNOSIS — F209 Schizophrenia, unspecified: Secondary | ICD-10-CM | POA: Diagnosis present

## 2012-07-21 DIAGNOSIS — R0902 Hypoxemia: Secondary | ICD-10-CM

## 2012-07-21 DIAGNOSIS — Z931 Gastrostomy status: Secondary | ICD-10-CM

## 2012-07-21 HISTORY — DX: Calculus of kidney: N20.0

## 2012-07-21 HISTORY — DX: Gastro-esophageal reflux disease without esophagitis: K21.9

## 2012-07-21 HISTORY — DX: Major depressive disorder, single episode, unspecified: F32.9

## 2012-07-21 HISTORY — DX: Unspecified osteoarthritis, unspecified site: M19.90

## 2012-07-21 HISTORY — DX: Dependence on supplemental oxygen: Z99.81

## 2012-07-21 HISTORY — DX: Personal history of other medical treatment: Z92.89

## 2012-07-21 HISTORY — DX: Acute embolism and thrombosis of unspecified deep veins of right lower extremity: I82.401

## 2012-07-21 HISTORY — DX: Heart failure, unspecified: I50.9

## 2012-07-21 HISTORY — DX: Depression, unspecified: F32.A

## 2012-07-21 HISTORY — DX: Angina pectoris, unspecified: I20.9

## 2012-07-21 LAB — URINE MICROSCOPIC-ADD ON

## 2012-07-21 LAB — CBC WITH DIFFERENTIAL/PLATELET
Basophils Absolute: 0 10*3/uL (ref 0.0–0.1)
Basophils Relative: 0 % (ref 0–1)
Eosinophils Absolute: 0.3 10*3/uL (ref 0.0–0.7)
HCT: 34.1 % — ABNORMAL LOW (ref 36.0–46.0)
Hemoglobin: 10.5 g/dL — ABNORMAL LOW (ref 12.0–15.0)
Lymphocytes Relative: 24 % (ref 12–46)
MCHC: 30.8 g/dL (ref 30.0–36.0)
Monocytes Relative: 7 % (ref 3–12)
Neutro Abs: 7.6 10*3/uL (ref 1.7–7.7)
Neutrophils Relative %: 66 % (ref 43–77)
WBC: 11.4 10*3/uL — ABNORMAL HIGH (ref 4.0–10.5)

## 2012-07-21 LAB — COMPREHENSIVE METABOLIC PANEL
ALT: 11 U/L (ref 0–35)
AST: 10 U/L (ref 0–37)
Alkaline Phosphatase: 88 U/L (ref 39–117)
CO2: 30 mEq/L (ref 19–32)
Calcium: 9.2 mg/dL (ref 8.4–10.5)
Glucose, Bld: 86 mg/dL (ref 70–99)
Potassium: 4.4 mEq/L (ref 3.5–5.1)
Sodium: 139 mEq/L (ref 135–145)
Total Protein: 7.9 g/dL (ref 6.0–8.3)

## 2012-07-21 LAB — POCT I-STAT TROPONIN I: Troponin i, poc: 0 ng/mL (ref 0.00–0.08)

## 2012-07-21 LAB — URINALYSIS, ROUTINE W REFLEX MICROSCOPIC
Bilirubin Urine: NEGATIVE
Glucose, UA: NEGATIVE mg/dL
Ketones, ur: NEGATIVE mg/dL
Specific Gravity, Urine: 1.009 (ref 1.005–1.030)
pH: 8.5 — ABNORMAL HIGH (ref 5.0–8.0)

## 2012-07-21 MED ORDER — TRAMADOL HCL 50 MG PO TABS
50.0000 mg | ORAL_TABLET | Freq: Three times a day (TID) | ORAL | Status: DC
  Administered 2012-07-22 – 2012-07-23 (×5): 50 mg via ORAL
  Filled 2012-07-21 (×7): qty 1

## 2012-07-21 MED ORDER — ASPIRIN 325 MG PO TABS
325.0000 mg | ORAL_TABLET | Freq: Every day | ORAL | Status: DC
  Administered 2012-07-22 – 2012-07-23 (×2): 325 mg via ORAL
  Filled 2012-07-21 (×2): qty 1

## 2012-07-21 MED ORDER — DEXTROSE 5 % IV SOLN
1.0000 g | Freq: Three times a day (TID) | INTRAVENOUS | Status: DC
  Administered 2012-07-21 – 2012-07-23 (×5): 1 g via INTRAVENOUS
  Filled 2012-07-21 (×8): qty 1

## 2012-07-21 MED ORDER — HYPROMELLOSE (GONIOSCOPIC) 2.5 % OP SOLN: 1.0000 [drp] | Freq: Four times a day (QID) | OPHTHALMIC | Status: DC | PRN

## 2012-07-21 MED ORDER — GUAIFENESIN 100 MG/5ML PO SOLN
15.0000 mL | Freq: Three times a day (TID) | ORAL | Status: DC
  Administered 2012-07-22 – 2012-07-23 (×5): 300 mg via ORAL
  Filled 2012-07-21 (×8): qty 15

## 2012-07-21 MED ORDER — CLONIDINE HCL 0.2 MG PO TABS
0.2000 mg | ORAL_TABLET | Freq: Two times a day (BID) | ORAL | Status: DC
  Administered 2012-07-22 – 2012-07-23 (×3): 0.2 mg via ORAL
  Filled 2012-07-21 (×5): qty 1

## 2012-07-21 MED ORDER — SODIUM CHLORIDE 0.9 % IV SOLN: INTRAVENOUS | Status: AC

## 2012-07-21 MED ORDER — SODIUM CHLORIDE 0.9 % IJ SOLN
10.0000 mL | INTRAMUSCULAR | Status: DC | PRN
  Administered 2012-07-22 – 2012-07-23 (×3): 10 mL

## 2012-07-21 MED ORDER — ALBUTEROL SULFATE (5 MG/ML) 0.5% IN NEBU: 2.5000 mg | INHALATION_SOLUTION | RESPIRATORY_TRACT | Status: DC | PRN

## 2012-07-21 MED ORDER — PIPERACILLIN-TAZOBACTAM 3.375 G IVPB
3.3750 g | Freq: Once | INTRAVENOUS | Status: AC
  Administered 2012-07-21: 3.375 g via INTRAVENOUS
  Filled 2012-07-21: qty 50

## 2012-07-21 MED ORDER — FLORA-Q PO CAPS
1.0000 | ORAL_CAPSULE | Freq: Every day | ORAL | Status: DC
  Administered 2012-07-22 – 2012-07-23 (×2): 1 via ORAL
  Filled 2012-07-21 (×2): qty 1

## 2012-07-21 MED ORDER — METOPROLOL TARTRATE 12.5 MG HALF TABLET
12.5000 mg | ORAL_TABLET | Freq: Two times a day (BID) | ORAL | Status: DC
Start: 2012-07-21 — End: 2012-07-23
  Administered 2012-07-22 – 2012-07-23 (×3): 12.5 mg via ORAL
  Filled 2012-07-21 (×5): qty 1

## 2012-07-21 MED ORDER — SODIUM CHLORIDE 0.9 % IV SOLN
INTRAVENOUS | Status: DC
  Administered 2012-07-21: 500 mL via INTRAVENOUS

## 2012-07-21 MED ORDER — RISPERIDONE 1 MG PO TBDP
1.0000 mg | ORAL_TABLET | Freq: Two times a day (BID) | ORAL | Status: DC
  Administered 2012-07-22 – 2012-07-23 (×4): 1 mg via ORAL
  Filled 2012-07-21 (×5): qty 1

## 2012-07-21 MED ORDER — IPRATROPIUM BROMIDE 0.02 % IN SOLN
500.0000 ug | Freq: Four times a day (QID) | RESPIRATORY_TRACT | Status: DC
  Administered 2012-07-21 – 2012-07-23 (×6): 500 ug via RESPIRATORY_TRACT
  Filled 2012-07-21 (×6): qty 2.5

## 2012-07-21 MED ORDER — ENOXAPARIN SODIUM 30 MG/0.3ML ~~LOC~~ SOLN
30.0000 mg | Freq: Every day | SUBCUTANEOUS | Status: DC
  Administered 2012-07-22 (×2): 30 mg via SUBCUTANEOUS
  Filled 2012-07-21 (×3): qty 0.3

## 2012-07-21 MED ORDER — VENLAFAXINE HCL 50 MG PO TABS
100.0000 mg | ORAL_TABLET | Freq: Two times a day (BID) | ORAL | Status: DC
  Administered 2012-07-22 – 2012-07-23 (×4): 100 mg via ORAL
  Filled 2012-07-21 (×5): qty 2

## 2012-07-21 MED ORDER — TRAZODONE HCL 50 MG PO TABS
75.0000 mg | ORAL_TABLET | Freq: Every evening | ORAL | Status: DC | PRN
  Administered 2012-07-22: 75 mg via ORAL
  Filled 2012-07-21 (×2): qty 1

## 2012-07-21 MED ORDER — OXYCODONE HCL 5 MG PO TABS
15.0000 mg | ORAL_TABLET | ORAL | Status: DC | PRN
  Administered 2012-07-22 – 2012-07-23 (×5): 15 mg via ORAL
  Filled 2012-07-21 (×7): qty 3

## 2012-07-21 MED ORDER — DOCUSATE SODIUM 100 MG PO CAPS
100.0000 mg | ORAL_CAPSULE | Freq: Two times a day (BID) | ORAL | Status: DC
  Administered 2012-07-22 – 2012-07-23 (×4): 100 mg via ORAL
  Filled 2012-07-21 (×3): qty 1

## 2012-07-21 MED ORDER — LEVOFLOXACIN IN D5W 750 MG/150ML IV SOLN
750.0000 mg | INTRAVENOUS | Status: DC
  Administered 2012-07-22 (×2): 750 mg via INTRAVENOUS
  Filled 2012-07-21 (×3): qty 150

## 2012-07-21 MED ORDER — HYDRALAZINE HCL 20 MG/ML IJ SOLN: 10.0000 mg | Freq: Four times a day (QID) | INTRAMUSCULAR | Status: DC | PRN

## 2012-07-21 MED ORDER — ALBUTEROL SULFATE (5 MG/ML) 0.5% IN NEBU
2.5000 mg | INHALATION_SOLUTION | Freq: Four times a day (QID) | RESPIRATORY_TRACT | Status: DC
  Administered 2012-07-21 – 2012-07-23 (×6): 2.5 mg via RESPIRATORY_TRACT
  Filled 2012-07-21 (×6): qty 0.5

## 2012-07-21 MED ORDER — OLANZAPINE 10 MG PO TABS
10.0000 mg | ORAL_TABLET | Freq: Every day | ORAL | Status: DC
  Administered 2012-07-22 – 2012-07-23 (×2): 10 mg via ORAL
  Filled 2012-07-21 (×2): qty 1

## 2012-07-21 MED ORDER — MELATONIN 10 MG PO CAPS: 10.0000 mg | ORAL_CAPSULE | Freq: Every day | ORAL | Status: DC

## 2012-07-21 MED ORDER — ONDANSETRON HCL 4 MG/2ML IJ SOLN: 4.0000 mg | Freq: Four times a day (QID) | INTRAMUSCULAR | Status: DC | PRN

## 2012-07-21 MED ORDER — VANCOMYCIN HCL IN DEXTROSE 1-5 GM/200ML-% IV SOLN: 1000.0000 mg | INTRAVENOUS | Status: DC

## 2012-07-21 MED ORDER — MORPHINE SULFATE 4 MG/ML IJ SOLN
4.0000 mg | Freq: Once | INTRAMUSCULAR | Status: AC
  Administered 2012-07-21: 4 mg via INTRAMUSCULAR
  Filled 2012-07-21: qty 1

## 2012-07-21 MED ORDER — POLYETHYLENE GLYCOL 3350 17 G PO PACK
17.0000 g | PACK | Freq: Every day | ORAL | Status: DC
  Administered 2012-07-22 – 2012-07-23 (×2): 17 g via ORAL
  Filled 2012-07-21 (×2): qty 1

## 2012-07-21 MED ORDER — VANCOMYCIN HCL IN DEXTROSE 1-5 GM/200ML-% IV SOLN
1000.0000 mg | Freq: Once | INTRAVENOUS | Status: AC
  Administered 2012-07-21: 1000 mg via INTRAVENOUS
  Filled 2012-07-21: qty 200

## 2012-07-21 MED ORDER — VANCOMYCIN HCL IN DEXTROSE 1-5 GM/200ML-% IV SOLN
1000.0000 mg | Freq: Two times a day (BID) | INTRAVENOUS | Status: DC
  Administered 2012-07-22 (×2): 1000 mg via INTRAVENOUS
  Filled 2012-07-21 (×4): qty 200

## 2012-07-21 MED ORDER — ACETAMINOPHEN 325 MG PO TABS: 650.0000 mg | ORAL_TABLET | Freq: Four times a day (QID) | ORAL | Status: DC | PRN

## 2012-07-21 MED ORDER — BISACODYL 10 MG RE SUPP: 10.0000 mg | Freq: Every day | RECTAL | Status: DC | PRN

## 2012-07-21 MED ORDER — OLANZAPINE 10 MG PO TABS: 10.0000 mg | ORAL_TABLET | Freq: Two times a day (BID) | ORAL | Status: DC

## 2012-07-21 MED ORDER — SUCRALFATE 1 G PO TABS
1.0000 g | ORAL_TABLET | Freq: Three times a day (TID) | ORAL | Status: DC
  Administered 2012-07-22 – 2012-07-23 (×7): 1 g via ORAL
  Filled 2012-07-21 (×11): qty 1

## 2012-07-21 MED ORDER — OLANZAPINE 10 MG PO TABS
20.0000 mg | ORAL_TABLET | Freq: Every day | ORAL | Status: DC
  Administered 2012-07-22 (×2): 20 mg via ORAL
  Filled 2012-07-21 (×3): qty 2

## 2012-07-21 MED ORDER — PANTOPRAZOLE SODIUM 40 MG PO PACK
40.0000 mg | PACK | Freq: Every day | ORAL | Status: DC
  Administered 2012-07-22: 40 mg via ORAL
  Filled 2012-07-21 (×2): qty 20

## 2012-07-21 MED ORDER — PRAMIPEXOLE DIHYDROCHLORIDE 0.125 MG PO TABS
0.1250 mg | ORAL_TABLET | Freq: Every day | ORAL | Status: DC
  Administered 2012-07-22 (×2): 0.125 mg via ORAL
  Filled 2012-07-21 (×3): qty 1

## 2012-07-21 NOTE — ED Notes (Signed)
Lactic acid results shown to Dr. Allen 

## 2012-07-21 NOTE — ED Notes (Signed)
IV team at bedside 

## 2012-07-21 NOTE — Progress Notes (Signed)
Called to bedside by RN for patient desat. Patient sats 72% with increased respirations. Patient was suctioned yielding large amounts of thick white secretions. After suctioning, HR was decreasing and sats increased to 100%.

## 2012-07-21 NOTE — Progress Notes (Signed)
Unit CM UR Completed by [facility] ED CM  W. Zaakirah Kistner RN  

## 2012-07-21 NOTE — ED Notes (Signed)
Attempted report 

## 2012-07-21 NOTE — Consult Note (Signed)
ANTIBIOTIC CONSULT NOTE - INITIAL  Pharmacy Consult for Vancomycin, Cefepime, Levaquin Indication: pneumonia  Allergies  Allergen Reactions  . Latex Other (See Comments)    Reaction unknown    Patient Measurements: Height: 5' 4.17" (163 cm) Weight: 220 lb 10.9 oz (100.1 kg) IBW/kg (Calculated) : 55.1  Vital Signs: Temp: 98.9 F (37.2 C) (06/09 1424) Temp src: Oral (06/09 1424) BP: 163/98 mmHg (06/09 1630) Pulse Rate: 101 (06/09 1702) Intake/Output from previous day:   Intake/Output from this shift: Total I/O In: 240 [P.O.:240] Out: -   Labs:  Recent Labs  07/21/12 1529  WBC 11.4*  HGB 10.5*  PLT 268  CREATININE 0.96   Estimated Creatinine Clearance: 83.6 ml/min (by C-G formula based on Cr of 0.96).  Microbiology: No results found for this or any previous visit (from the past 720 hour(s)).  Medical History: Past Medical History  Diagnosis Date  . Respiratory failure   . Dysphagia   . Contracture of hand joint   . Anemia   . Neurogenic bladder   . Anxiety   . Schizophrenia   . Quadriplegia   . Hypertension   . Pneumonia    Assessment: 46yo quadriplegic female w/ chronic respiratory failure s/p trach/peg presents to the ED from a nursing home with SOB. She will begin empiric antibiotics for HCAP. Renal function is stable. She received 1g of vancomycin in the ED at 1721 and 3.375g of Zosyn at 1528.  Goal of Therapy:  Vancomycin trough level 15-20 mcg/ml Appropriate cefepime, levaquin dosing  Plan:  1) Vancomycin 1g IV x 1 now (to complete 2g loading dose) then 1g IV q12 2) Cefepime 1g IV q8 3) Levaquin 750mg  IV q24 4) Follow renal function, cultures, trough at steady state  Fredrik Rigger 07/21/2012,6:31 PM

## 2012-07-21 NOTE — ED Notes (Signed)
Admitting MD at bedside.

## 2012-07-21 NOTE — ED Notes (Signed)
Report called  

## 2012-07-21 NOTE — H&P (Signed)
[facility] History and Physical  Jaylena Holloway ZOX:[id number] DOB: 11-26-1965 DOA: 07/21/2012  Referring physician:EDP PCP: Terald Sleeper, MD  Specialists: Neurology Dr.Haworth  Chief Complaint: SoB  HPI: [name] is a 47 y.o. female with quadriplegia, chronic respiratory failure status post tracheostomy, PEG tube with resident of [facility] facility was sent to the emergency room for evaluation for shortness of breath. Patient reports increased shortness of breath for 2-3 days, nonproductive cough, feeling hot and cold, chills. She denies increased secretions from the trach. Upon evaluation the emergency room, she was noted to have a right lower lobe infiltrate. She also has a chronic indwelling Foley catheter for neurogenic bladder, denies any urinary symptoms however does not have much sensations regardless.   Review of Systems:  The patient denies anorexia, fever, weight loss,, vision loss, decreased hearing, hoarseness, chest pain, syncope, dyspnea on exertion, peripheral edema, balance deficits, hemoptysis, abdominal pain, melena, hematochezia, severe indigestion/heartburn, hematuria, incontinence, genital sores, muscle weakness, suspicious skin lesions, transient blindness, difficulty walking, depression, unusual weight change, abnormal bleeding, enlarged lymph nodes, angioedema, and breast masses.    Past Medical History  Diagnosis Date  . Respiratory failure   . Dysphagia   . Contracture of hand joint   . Anemia   . Neurogenic bladder   . Anxiety   . Schizophrenia   . Quadriplegia   . Hypertension   . Pneumonia    Past Surgical History  Procedure Laterality Date  . Tracheostomy    . Gastrostomy w/ feeding tube     Social History:  reports that she quit smoking about 5 years ago. Her smoking use included Cigarettes. She smoked 2.00 packs per day. She has never used smokeless tobacco. She reports that she does not drink alcohol or use  illicit drugs. Lives at SNF  Allergies  Allergen Reactions  . Latex Other (See Comments)    Reaction unknown    family history : Per parents deceased, cause unclear    Prior to Admission medications   Medication Sig Start Date End Date Taking? Authorizing Provider  albuterol (PROVENTIL) (2.5 MG/3ML) 0.083% nebulizer solution Take 2.5 mg by nebulization every 6 (six) hours.   Yes Historical Provider, MD  aspirin 325 MG tablet Take 325 mg by mouth daily.   Yes Historical Provider, MD  bisacodyl (DULCOLAX) 10 MG suppository Place 10 mg rectally every other day as needed for constipation.   Yes Historical Provider, MD  calcium-vitamin D (OSCAL 500/200 D-3) 500-200 MG-UNIT per tablet Take 1 tablet by mouth every morning.   Yes Historical Provider, MD  cloNIDine (CATAPRES) 0.2 MG tablet Take 0.2 mg by mouth 2 (two) times daily.   Yes Historical Provider, MD  docusate sodium (COLACE) 100 MG capsule Take 100 mg by mouth 2 (two) times daily.   Yes Historical Provider, MD  ferrous sulfate 325 (65 FE) MG tablet Take 325 mg by mouth daily with breakfast.   Yes Historical Provider, MD  Flora-Q (FLORA-Q) CAPS Take 1 capsule by mouth daily.   Yes Historical Provider, MD  fluticasone (FLONASE) 50 MCG/ACT nasal spray Place 1 spray into the nose daily.   Yes Historical Provider, MD  furosemide (LASIX) 20 MG tablet Take 20 mg by mouth daily.   Yes Historical Provider, MD  guaiFENesin (ROBITUSSIN) 100 MG/5ML SOLN Take 15 mLs by mouth every 8 (eight) hours. scheduled   Yes Historical Provider, MD  hydroxypropyl methylcellulose (ISOPTO TEARS) 2.5 % ophthalmic solution Place 1 drop into both eyes 4 (four)  times daily as needed (for dry eyes). Dry eyes   Yes Historical Provider, MD  ipratropium (ATROVENT HFA) 17 MCG/ACT inhaler Inhale 1 puff into the lungs every 6 (six) hours as needed for wheezing (and shortness of breath).   Yes Historical Provider, MD  ipratropium (ATROVENT) 0.02 % nebulizer solution Take  500 mcg by nebulization every 6 (six) hours.   Yes Historical Provider, MD  Melatonin 10 MG CAPS Take 10 mg by mouth at bedtime.   Yes Historical Provider, MD  metoprolol tartrate (LOPRESSOR) 25 MG tablet Take 12.5 mg by mouth 2 (two) times daily.   Yes Historical Provider, MD  OLANZapine (ZYPREXA) 10 MG tablet Take 10-20 mg by mouth 2 (two) times daily. Take 10 mg in the morning and 20 mg at bedtime   Yes Historical Provider, MD  oxyCODONE (ROXICODONE) 15 MG immediate release tablet Take 15 mg by mouth See admin instructions. Take 1 tablet daily after breakfast and Take 1 tablet every 4 hours as needed for pain   Yes Historical Provider, MD  pantoprazole sodium (PROTONIX) 40 mg/20 mL PACK Take 40 mg by mouth daily.   Yes Historical Provider, MD  polyethylene glycol (MIRALAX / GLYCOLAX) packet Take 17 g by mouth daily. constipation   Yes Historical Provider, MD  pramipexole (MIRAPEX) 0.125 MG tablet Take 0.125 mg by mouth at bedtime.   Yes Historical Provider, MD  risperiDONE (RISPERDAL M-TABS) 1 MG disintegrating tablet Take 1 mg by mouth 2 (two) times daily.   Yes Historical Provider, MD  sucralfate (CARAFATE) 1 G tablet Take 1 tablet (1 g total) by mouth 4 (four) times daily -  with meals and at bedtime. 03/14/12  Yes [name], MD  traMADol (ULTRAM) 50 MG tablet Take 1 tablet (50 mg total) by mouth 3 (three) times daily. 03/14/12  Yes [name], MD  traZODone (DESYREL) 150 MG tablet Take 150 mg by mouth at bedtime.   Yes Historical Provider, MD  traZODone (DESYREL) 150 MG tablet Take 75 mg by mouth at bedtime as needed (if waking in middle of the night).   Yes Historical Provider, MD  venlafaxine (EFFEXOR) 100 MG tablet Take 100 mg by mouth 2 (two) times daily.   Yes Historical Provider, MD   Physical Exam: Filed Vitals:   07/21/12 1433 07/21/12 1530 07/21/12 1630 07/21/12 1702  BP: 170/100 176/105 163/98   Pulse:  96 106 101  Temp:      TempSrc:      Resp:  13 15 18   SpO2:  99%  100% 100%     General: AAOx3, no distress  HEENT: trach site clean, obese neck, unable to appreciate JVD  Cardiovascular: S1S2/RRR  Respiratory:diminished breath sounds  Abdomen: soft, obese, NT, BS present, PEG in situ LUQ, baclofen pump  Skin: no rashes or skin breakdown  Musculoskeletal: trace edema c/c  Psychiatric: appropriate mood and affect  Neurologic: quadriplegic  Labs on Admission:  Basic Metabolic Panel:  Recent Labs Lab 07/21/12 1529  NA 139  K 4.4  CL 99  CO2 30  GLUCOSE 86  BUN 23  CREATININE 0.96  CALCIUM 9.2   Liver Function Tests:  Recent Labs Lab 07/21/12 1529  AST 10  ALT 11  ALKPHOS 88  BILITOT 0.2*  PROT 7.9  ALBUMIN 3.4*   No results found for this basename: LIPASE, AMYLASE,  in the last 168 hours No results found for this basename: AMMONIA,  in the last 168 hours CBC:  Recent Labs  Lab 07/21/12 1529  WBC 11.4*  NEUTROABS 7.6  HGB 10.5*  HCT 34.1*  MCV 74.9*  PLT 268   Cardiac Enzymes: No results found for this basename: CKTOTAL, CKMB, CKMBINDEX, TROPONINI,  in the last 168 hours  BNP (last 3 results)  Recent Labs  10/25/11 0500 11/20/11 0442  PROBNP 108.3 429.3*   CBG: No results found for this basename: GLUCAP,  in the last 168 hours  Radiological Exams on Admission: Dg Chest Portable 1 View  07/21/2012   *RADIOLOGY REPORT*  Clinical Data: Shortness of breath  PORTABLE CHEST - 1 VIEW  Comparison: 03/11/2012  Findings: Tracheostomy catheter and left-sided port are again identified.  There is some mild deformity of the port catheter at its vein entry site likely related to mild pinch off syndrome.  The cardiac shadow is stable.  Increased density is noted in the right base consistent with an early infiltrate.  IMPRESSION: Early right basilar infiltrate.  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   Original Report Authenticated By: Alcide Clever, M.D.    EKG: Independently reviewed.    Assessment/Plan Active Problems:   Healthcare-associated pneumonia   Tracheostomy in place   Neurogenic bladder with chronic Foley catheter   Quadriplegia   1. Health care associated pneumonia: - Blood cultures x2 drawn the emergency room -Broad-spectrum antibiotics using IV vancomycin cefepime and Levaquin -Check urine Legionella/pneumococcal antigen, sputum culture -RT to induce sputum -Pulmonary toilet using albuterol Atrovent nebs and chest physiotherapy -De-escalate antibiotics in 48 hours depending on cultures -Suction PRN -Swallow evaluation  2. Quadriplegia with contractures on baclofen pump -Followed by Dr. Windle Guard at [facility] - This is refilled every 3 months and due to be refilled on Thursday per patient report, and I have asked pharmacy to request this, if we are unable to obtain this she will need to be transferred to [facility] or alternate hospital.  3. chronic constipation -Continue laxatives and stool softeners  4. abnormal UA: Colonization versus UTI -Change Foley catheter - Antibiotics as noted above  - check urine culture  5. Schizophrenia - Continue home dose antipsychotics  6. DVT prophylaxis with Lovenox  Code Status: FULL Family Communication: none at bedside Disposition Plan: Inpatient  Time spent:  [facility] Pager [phone]  If 7PM-7AM, please contact night-coverage [email or web address] Password [facility] 07/21/2012, 5:39 PM

## 2012-07-21 NOTE — ED Provider Notes (Signed)
History     CSN: [id number]  Arrival date & time 07/21/12  1123   First MD Initiated Contact with Patient 07/21/12 1125      Chief Complaint  Patient presents with  . Shortness of Breath    (Consider location/radiation/quality/duration/timing/severity/associated sxs/prior treatment) Patient is a 47 y.o. female presenting with shortness of breath. The history is provided by the patient.  Shortness of Breath  patient here with complaint of shortness of breath x2 days with some cough has been nonproductive. Denies any chest pain or abdominal pain. Notes subjective fever. Denies orthopnea. She does have a history of a tracheostomy denies any increased secretions. Was seen by the nursing home physician today and sent her for further evaluation. No treatment used prior to arrival and symptoms have been gradually worse  Past Medical History  Diagnosis Date  . Respiratory failure   . Dysphagia   . Contracture of hand joint   . Anemia   . Neurogenic bladder   . Anxiety   . Schizophrenia   . Quadriplegia   . Hypertension   . Pneumonia     Past Surgical History  Procedure Laterality Date  . Tracheostomy    . Gastrostomy w/ feeding tube      No family history on file.  History  Substance Use Topics  . Smoking status: Former Smoker -- 2.00 packs/day    Types: Cigarettes    Quit date: 11/17/2006  . Smokeless tobacco: Never Used  . Alcohol Use: No    OB History   Grav Para Term Preterm Abortions TAB SAB Ect Mult Living                  Review of Systems  Respiratory: Positive for shortness of breath.   All other systems reviewed and are negative.    Allergies  Latex  Home Medications   Current Outpatient Rx  Name  Route  Sig  Dispense  Refill  . albuterol (PROVENTIL) (5 MG/ML) 0.5% nebulizer solution   Nebulization   Take 0.5 mLs (2.5 mg total) by nebulization 3 (three) times daily.   20 mL      . aspirin 325 MG tablet   Oral   Take 325 mg by mouth  daily.         . calcium-vitamin D (OSCAL 500/200 D-3) 500-200 MG-UNIT per tablet   Oral   Take 1 tablet by mouth every morning.         . cloNIDine (CATAPRES) 0.2 MG tablet   Oral   Take 0.2 mg by mouth 2 (two) times daily.         Marland Kitchen docusate sodium (COLACE) 100 MG capsule   Oral   Take 100 mg by mouth 2 (two) times daily.         . feeding supplement (PRO-STAT SUGAR FREE 64) LIQD   Oral   Take 30 mLs by mouth 3 (three) times daily with meals.         . ferrous sulfate 325 (65 FE) MG tablet   Oral   Take 325 mg by mouth daily with breakfast.         . Flora-Q (FLORA-Q) CAPS   Oral   Take 1 capsule by mouth daily.         . fluticasone (FLONASE) 50 MCG/ACT nasal spray   Nasal   Place 1 spray into the nose daily.         . furosemide (LASIX) 20 MG tablet  Oral   Take 20 mg by mouth daily.         Marland Kitchen guaiFENesin (ROBITUSSIN) 100 MG/5ML SOLN   Oral   Take 15 mLs by mouth every 8 (eight) hours. scheduled         . Heparin & NaCl Lock Flush (HEPARIN SODIUM FLUSH IV)   Intravenous   Inject 3 mLs into the vein 3 (three) times daily. After iv antibiotic infusion         . hydroxypropyl methylcellulose (ISOPTO TEARS) 2.5 % ophthalmic solution   Both Eyes   Place 1 drop into both eyes every 4 (four) hours as needed. Dry eyes         . ipratropium (ATROVENT HFA) 17 MCG/ACT inhaler   Inhalation   Inhale 1 puff into the lungs every 4 (four) hours as needed for wheezing. Wheezing or shortness of breath   1 Inhaler      . ipratropium (ATROVENT) 0.02 % nebulizer solution   Nebulization   Take 2.5 mLs (0.5 mg total) by nebulization 3 (three) times daily.   75 mL      . levofloxacin (LEVAQUIN) 750 MG tablet   Oral   Take 1 tablet (750 mg total) by mouth daily. Stop date 03/19/12         . Melatonin 5 MG CAPS   Oral   Take 1 capsule by mouth at bedtime.         . metoprolol tartrate (LOPRESSOR) 12.5 mg TABS   Oral   Take 0.5 tablets (12.5 mg  total) by mouth 2 (two) times daily.         Marland Kitchen OLANZapine (ZYPREXA) 10 MG tablet   Oral   Take 10 mg by mouth at bedtime.         Marland Kitchen oxyCODONE (ROXICODONE) 15 MG immediate release tablet   Oral   Take 1 tablet (15 mg total) by mouth every 6 (six) hours as needed for pain. A.M. Scheduled dose 0900 May have  Additional dose every 6 hours if needed for pain   10 tablet   0   . pantoprazole sodium (PROTONIX) 40 mg/20 mL PACK   Oral   Take 40 mg by mouth daily. Granule pack         . polyethylene glycol (MIRALAX / GLYCOLAX) packet   Oral   Take 17 g by mouth daily as needed. constipation         . pramipexole (MIRAPEX) 0.125 MG tablet   Oral   Take 0.125 mg by mouth at bedtime.         . Sodium Chloride Flush (SALINE FLUSH IV)   Intravenous   Inject 10 mLs into the vein 3 (three) times daily. Before and after iv antibiotics         . sucralfate (CARAFATE) 1 G tablet   Oral   Take 1 tablet (1 g total) by mouth 4 (four) times daily -  with meals and at bedtime.         . traMADol (ULTRAM) 50 MG tablet   Oral   Take 1 tablet (50 mg total) by mouth 3 (three) times daily.   30 tablet      . traZODone (DESYREL) 150 MG tablet   Oral   Take 150 mg by mouth at bedtime.         Marland Kitchen venlafaxine (EFFEXOR) 100 MG tablet   Oral   Take 100 mg by mouth 2 (two) times daily.  BP 179/90  Pulse 82  Resp 18  SpO2 98%  Physical Exam  Nursing note and vitals reviewed. Constitutional: She appears well-developed and well-nourished.  Non-toxic appearance. No distress.  HENT:  Head: Normocephalic and atraumatic.  Eyes: Conjunctivae, EOM and lids are normal. Pupils are equal, round, and reactive to light.  Neck: Normal range of motion. Neck supple. No tracheal deviation present. No mass present.  Cardiovascular: Normal rate, regular rhythm and normal heart sounds.  Exam reveals no gallop.   No murmur heard. Pulmonary/Chest: Effort normal. No stridor. No  respiratory distress. She has decreased breath sounds. She has no wheezes. She has no rhonchi. She has no rales.  Abdominal: Soft. Normal appearance and bowel sounds are normal. She exhibits no distension. There is no tenderness. There is no rebound and no CVA tenderness.  Musculoskeletal: Normal range of motion. She exhibits no edema and no tenderness.  Neurological: She is alert. No cranial nerve deficit. GCS eye subscore is 4. GCS verbal subscore is 5. GCS motor subscore is 6.  Baseline quadraplegia unchanged  Skin: Skin is warm and dry. No abrasion and no rash noted.  Psychiatric: Her affect is blunt. Her speech is delayed. She is slowed.    ED Course  Procedures (including critical care time)  Labs Reviewed  CULTURE, BLOOD (ROUTINE X 2)  CULTURE, BLOOD (ROUTINE X 2)  CBC WITH DIFFERENTIAL  COMPREHENSIVE METABOLIC PANEL  URINALYSIS, ROUTINE W REFLEX MICROSCOPIC   No results found.   No diagnosis found.    MDM   Date: 07/21/2012  Rate: 61  Rhythm: normal sinus rhythm  QRS Axis: normal  Intervals: normal  ST/T Wave abnormalities: normal  Conduction Disutrbances:nonspecific intraventricular conduction delay  Narrative Interpretation:   Old EKG Reviewed: unchanged  Pt with early pna, will start abx and likely admit          Toy Baker, MD 07/21/12 1324

## 2012-07-21 NOTE — Progress Notes (Signed)
PT does not wish to use vest therapy at this time. Pt has indicated that she has used before and it is uncomfortable.

## 2012-07-21 NOTE — ED Notes (Signed)
From [facility] - pt c/o SOB onset this morning. Pt appears to be in no distress, POX 99% 5L trach collar.

## 2012-07-21 NOTE — ED Notes (Signed)
Awaiting IV team to access portacath

## 2012-07-22 DIAGNOSIS — D638 Anemia in other chronic diseases classified elsewhere: Secondary | ICD-10-CM

## 2012-07-22 DIAGNOSIS — Z930 Tracheostomy status: Secondary | ICD-10-CM

## 2012-07-22 LAB — COMPREHENSIVE METABOLIC PANEL
ALT: 10 U/L (ref 0–35)
AST: 8 U/L (ref 0–37)
Albumin: 2.9 g/dL — ABNORMAL LOW (ref 3.5–5.2)
CO2: 28 mEq/L (ref 19–32)
Chloride: 98 mEq/L (ref 96–112)
GFR calc non Af Amer: 72 mL/min — ABNORMAL LOW (ref >90)
Sodium: 136 mEq/L (ref 135–145)
Total Bilirubin: 0.2 mg/dL — ABNORMAL LOW (ref 0.3–1.2)

## 2012-07-22 LAB — CBC
Platelets: 239 10*3/uL (ref 150–400)
RBC: 4.27 MIL/uL (ref 3.87–5.11)
RDW: 14.2 % (ref 11.5–15.5)
WBC: 10.5 10*3/uL (ref 4.0–10.5)

## 2012-07-22 LAB — LEGIONELLA ANTIGEN, URINE: Legionella Antigen, Urine: NEGATIVE

## 2012-07-22 LAB — STREP PNEUMONIAE URINARY ANTIGEN: Strep Pneumo Urinary Antigen: NEGATIVE

## 2012-07-22 MED ORDER — WHITE PETROLATUM GEL
Status: AC
  Administered 2012-07-22: 10:00:00
  Filled 2012-07-22: qty 5

## 2012-07-22 NOTE — Progress Notes (Signed)
Pt states that she wants the doctors to know that she has worms in her colon.  She c/o some cramping and discomfort. Small smeared stool noted tonight on admission to unit and more recently. No abnormalities noted

## 2012-07-22 NOTE — Evaluation (Signed)
Clinical/Bedside Swallow Evaluation Patient Details  Name: Naila Elizondo MRN: [id number] Date of Birth: July 29, 1965  Today's Date: 07/22/2012 Time: 0900-0934 SLP Time Calculation (min): 34 min  Past Medical History:  Past Medical History  Diagnosis Date  . Respiratory failure   . Dysphagia   . Contracture of hand joint   . Anemia   . Neurogenic bladder   . Anxiety   . Schizophrenia   . Quadriplegia   . Hypertension   . CHF (congestive heart failure)   . Anginal pain   . Right leg DVT 1980's  . Pneumonia     "today; I get it alot" (07/21/2012)  . History of blood transfusion     "I've had a few" (07/21/2012)  . GERD (gastroesophageal reflux disease)   . Arthritis     "in my hands" (07/21/2012)  . Depression   . Kidney stones     "about 4; 2 different times" (6/92014)  . On home oxygen therapy     "38% q hs" (07/21/2012)   Past Surgical History:  Past Surgical History  Procedure Laterality Date  . Tracheostomy  2011  . Gastrostomy w/ feeding tube    . Tubal ligation  1996  . Cystoscopy/retrograde/ureteroscopy/stone extraction with basket    . Lithotripsy    . Peg placement     HPI:  Loribeth Katich is a 47 y.o. female with quadriplegia, chronic respiratory failure status post tracheostomy, PEG tube with resident of [facility] skilled [facility] facility was sent to the emergency room for evaluation for shortness of breath. Upon evaluation the emergency room, she was noted to have a right lower lobe infiltrate.  Review of medical records from SNF in hard chart patient on regular consistency and thin liquids prior to hospitalization.    Assessment / Plan / Recommendation Clinical Impression  PMSV in place prior to evaluation.  Oropharyngeal swallow functional for regular conistency and thin liquids with no observed s/s of aspiration noted with PO trials.  Recommend to advance to regular consistency and continue thin liquids wtih aspiration precautions.  ST to follow briefly in  acute care setting for diet tolerance due to results of current CXR.      Aspiration Risk  Mild    Diet Recommendation Regular;Thin liquid   Medication Administration: Crushed with puree Supervision: Staff feed patient;Full supervision/cueing for compensatory strategies Compensations: Slow rate;Small sips/bites Postural Changes and/or Swallow Maneuvers: Seated upright 90 degrees;Upright 30-60 min after meal    Other  Recommendations Oral Care Recommendations: Oral care QID   Follow Up Recommendations  Skilled Nursing facility    Frequency and Duration min 1 x/week  1 week       SLP Swallow Goals Patient will consume recommended diet without observed clinical signs of aspiration with: Total assistance   Swallow Study Prior Functional Status  Resident of SNF on regular consistency and thin liquids     General Date of Onset: 07/21/12 HPI: Elyshia Kumagai is a 47 y.o. female with quadriplegia, chronic respiratory failure status post tracheostomy, PEG tube with resident of [facility] skilled [facility] facility was sent to the emergency room for evaluation for shortness of breath. Type of Study: Bedside swallow evaluation Previous Swallow Assessment: Fees 12/19/11 regular/thin  BSE 10/25/11 regular/thin Diet Prior to this Study: Dysphagia 2 (chopped);Thin liquids Temperature Spikes Noted: No Respiratory Status: Trach History of Recent Intubation: No Behavior/Cognition: Alert;Cooperative;Pleasant mood Oral Cavity - Dentition: Dentures, top (missing dentition on bottom row) Self-Feeding Abilities: Total assist Patient Positioning: Upright in bed  Baseline Vocal Quality: Clear Volitional Cough: Strong Volitional Swallow: Able to elicit    Oral/Motor/Sensory Function Overall Oral Motor/Sensory Function: Appears within functional limits for tasks assessed   Ice Chips Ice chips: Not tested   Thin Liquid Thin Liquid: Within functional limits Presentation: Cup;Straw    Nectar Thick  Nectar Thick Liquid: Not tested   Honey Thick Honey Thick Liquid: Not tested   Puree Puree: Within functional limits Presentation: Spoon   Solid   GO    Solid: Within functional limits      Moreen Fowler MS, CCC-SLP [phone] [facility] 07/22/2012,9:42 AM

## 2012-07-22 NOTE — Progress Notes (Shared)
Patient ID: [name], female   DOB: Jun 20, 1965, 47 y.o.   MRN: [id number]           PROGRESS NOTE  DATE:  07/15/2012    FACILITY: [facility]   LEVEL OF CARE:   SNF   Acute Visit   CHIEF COMPLAINT:  Follow up vivid delusions and tactile hallucinations.    HISTORY OF PRESENT ILLNESS:  [name] continues to have a very difficult time with fixed delusional thought about her body being infested with worms, although I did give initial credence to some disturbing sensation she might be having in a patient who is quadriparetic.  Her description of her symptoms has included ideas that the worms know we are coming and that is how we can never see them.  She thinks they are in her arms, ears, and have a mind of their own.  She has been treated with increasing doses of antipsychotics with really marginal effect and I believe she is on Zyprexa and Risperdal now.    It has been pointed out by the consultant pharmacist that several of her medications can induce hallucinations including Mirapex for restless legs.  In looking at the list, she is on a lot of psychoactive medications including SSRIs, Ultram, melatonin, and the Mirapex.  She has not had any issues with regards to vital sign instability.  However, I would wonder how serotonin syndrome, for instance, might present in somebody who is quadriparetic.  However, she does not look anything like the description of this.  Nevertheless, I think that several of her psychiatric medications should be put on hold and I am going to go ahead and hold the Mirapex, melatonin, Ultram, and trazodone.      ASSESSMENT/PLAN:  Vivid delusions and hallucinations.  I am going to put several of her medications on hold for a trial to see if we can make this lady any better.  She always tracks me down in the facility in order to try and get me to prescribe antiparasitic medications for her.  I am really very reluctant to do this.  I have even considered a placebo to  try and help her with the idea that I am doing something.  However, this does not seem to be something we can do in long-term care these days.    CPT CODE: 91478

## 2012-07-22 NOTE — Progress Notes (Addendum)
[facility] PROGRESS NOTE  [name] YNW:[id number] DOB: 03/04/1965 DOA: 07/21/2012 PCP: Terald Sleeper, MD Brief Narrative [name] is a 47 y.o. female with quadriplegia, chronic respiratory failure status post tracheostomy, PEG tube with resident of [facility] skilled nursing facility was sent to the emergency room 6/9 for evaluation for shortness of breath.  Patient reported increased shortness of breath for 2-3 days, nonproductive cough, feeling hot and cold, chills. Upon evaluation in ER, noted to have HCAP vs aspiration pneumonia, started on broad spectrum abx. See note about baclofen pump below  Assessment/Plan: 1. Health care associated pneumonia:  -FU  Blood cultures x2 drawn 6/9 -Broad-spectrum antibiotics using IV vancomycin cefepime and Levaquin, day 2 -Urine Legionella pending, pneumococcal antigen pending -FU sputum culture ; gram stain polymicrobial -Pulmonary toilet using albuterol Atrovent nebs and chest physiotherapy  -De-escalate antibiotics tomorrow 48 hours depending on cultures  -Suction PRN  -wean O2 -Swallow evaluation completed, regular diet/thin liquids -I called Dr.Bates office([facility]) about trach inner tube change, was able to d/w his partner who recommended to have resp clean this and FU with Dr.Bates after discharge.  2. 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 Windle Guard at [facility]  - This is refilled every 3 months and due to be refilled on 6/13, I called Dr.Haworths office and dicussed with him, he was able to give me the information on concentration, rate on infusion etc, pharmacy has this available but no physician here who can refill this. This is usually done by Dr.Haworth at [facility], I discussed with Dr.Hayworth again and our goal is to stabilize and discharge by Thursday if possible to [facility] so she go there Friday to get this refilled if notb stable for DC then Transport by  Ambulance to his office on Friday if still inpatient.   3. chronic constipation  -Continue laxatives and stool softeners   4. abnormal UA: Colonization versus UTI  - Foley catheter changed 6/1 at [facility] - Antibiotics as noted above  - FU urine culture   5. Schizophrenia  - Continue home dose antipsychotics   6. DVT prophylaxis with Lovenox   Code Status: FULL  Family Communication: none at bedside  Disposition Plan: SNF when stable    Consultants:  D/W Dr.Haworth, [facility] by Telephone Tele: [phone]  Antibiotics:  Vanc 6/9  Cefepime 6/9  Levaquin 6/9  HPI/Subjective: Feels ok, apparently supposed to have trach tube changed at [facility] office this week  Objective: Filed Vitals:   07/22/12 0211 07/22/12 0410 07/22/12 0528 07/22/12 0757  BP: 140/88  106/62   Pulse: 119 112 91   Temp:   98.6 F (37 C)   TempSrc:   Oral   Resp:   18   Height:      Weight:      SpO2: 96% 98% 99% 99%    Intake/Output Summary (Last 24 hours) at 07/22/12 1120 Last data filed at 07/22/12 0900  Gross per 24 hour  Intake 970.67 ml  Output   3200 ml  Net -2229.33 ml   Filed Weights   07/21/12 1702  Weight: 100.1 kg (220 lb 10.9 oz)    Exam: General: AAOx3, no distress  HEENT: trach site clean, obese neck, unable to appreciate JVD  Cardiovascular: S1S2/RRR  Respiratory:diminished breath sounds Abdomen: soft, obese, NT, BS present, PEG in situ LUQ, baclofen pump  Skin: no rashes or skin breakdown  Musculoskeletal: trace edema c/c  Psychiatric: appropriate mood and affect  Neurologic: quadriplegic  Data Reviewed: Basic Metabolic Panel:  Recent Labs Lab 07/21/12 1529 07/22/12 0430  NA 139 136  K 4.4 4.1  CL 99 98  CO2 30 28  GLUCOSE 86 146*  BUN 23 21  CREATININE 0.96 0.93  CALCIUM 9.2 8.8   Liver Function Tests:  Recent Labs Lab 07/21/12 1529 07/22/12 0430  AST 10 8  ALT 11 10  ALKPHOS 88 77  BILITOT 0.2* 0.2*  PROT 7.9  7.2  ALBUMIN 3.4* 2.9*   No results found for this basename: LIPASE, AMYLASE,  in the last 168 hours No results found for this basename: AMMONIA,  in the last 168 hours CBC:  Recent Labs Lab 07/21/12 1529 07/22/12 0430  WBC 11.4* 10.5  NEUTROABS 7.6  --   HGB 10.5* 9.6*  HCT 34.1* 32.2*  MCV 74.9* 75.4*  PLT 268 239   Cardiac Enzymes: No results found for this basename: CKTOTAL, CKMB, CKMBINDEX, TROPONINI,  in the last 168 hours BNP (last 3 results)  Recent Labs  10/25/11 0500 11/20/11 0442  PROBNP 108.3 429.3*   CBG: No results found for this basename: GLUCAP,  in the last 168 hours  Recent Results (from the past 240 hour(s))  CULTURE, BLOOD (ROUTINE X 2)     Status: None   Collection Time    07/21/12  1:24 PM      Result Value Range Status   Specimen Description BLOOD HAND RIGHT   Final   Special Requests BOTTLES DRAWN AEROBIC ONLY 1.5ML   Final   Culture  Setup Time 07/21/2012 16:38   Final   Culture     Final   Value:        BLOOD CULTURE RECEIVED NO GROWTH TO DATE CULTURE WILL BE HELD FOR 5 DAYS BEFORE ISSUING A FINAL NEGATIVE REPORT   Report Status PENDING   Incomplete  CULTURE, RESPIRATORY (NON-EXPECTORATED)     Status: None   Collection Time    07/21/12 10:10 PM      Result Value Range Status   Specimen Description TRACHEAL ASPIRATE   Final   Special Requests NONE   Final   Gram Stain     Final   Value: FEW WBC PRESENT, PREDOMINANTLY PMN     RARE SQUAMOUS EPITHELIAL CELLS PRESENT     RARE GRAM POSITIVE COCCI     IN PAIRS RARE GRAM POSITIVE RODS   Culture PENDING   Incomplete   Report Status PENDING   Incomplete     Studies: Dg Chest Portable 1 View  07/21/2012   *RADIOLOGY REPORT*  Clinical Data: Shortness of breath  PORTABLE CHEST - 1 VIEW  Comparison: 03/11/2012  Findings: Tracheostomy catheter and left-sided port are again identified.  There is some mild deformity of the port catheter at its vein entry site likely related to mild pinch off  syndrome.  The cardiac shadow is stable.  Increased density is noted in the right base consistent with an early infiltrate.  IMPRESSION: Early right basilar infiltrate.  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   Original Report Authenticated By: Alcide Clever, M.D.    Scheduled Meds: . sodium chloride   Intravenous STAT  . albuterol  2.5 mg Nebulization QID  . aspirin  325 mg Oral Daily  . ceFEPime (MAXIPIME) IV  1 g Intravenous Q8H  . cloNIDine  0.2 mg Oral BID  . docusate sodium  100 mg Oral BID  . enoxaparin (LOVENOX) injection  30 mg Subcutaneous  QHS  . Flora-Q  1 capsule Oral Daily  . guaiFENesin  15 mL Oral Q8H  . ipratropium  500 mcg Nebulization QID  . levofloxacin (LEVAQUIN) IV  750 mg Intravenous Q24H  . metoprolol tartrate  12.5 mg Oral BID  . OLANZapine  10 mg Oral Daily  . OLANZapine  20 mg Oral QHS  . pantoprazole sodium  40 mg Oral Q1200  . polyethylene glycol  17 g Oral Daily  . pramipexole  0.125 mg Oral QHS  . risperiDONE  1 mg Oral BID  . sucralfate  1 g Oral TID WC & HS  . traMADol  50 mg Oral TID  . vancomycin  1,000 mg Intravenous Q12H  . venlafaxine  100 mg Oral BID   Continuous Infusions: . sodium chloride 500 mL (07/21/12 1528)    Active Problems:   Healthcare-associated pneumonia   Tracheostomy in place   Neurogenic bladder with chronic Foley catheter   Quadriplegia    Time spent: , to call and discuss with NEurology, pharmacy, ENT    [facility]  [facility] Pager [phone]. If 7PM-7AM, please contact night-coverage at [email or web address], password [facility] 07/22/2012, 11:20 AM  LOS: 1 day

## 2012-07-22 NOTE — Clinical Social Work Psychosocial (Signed)
     Clinical Social Work Department BRIEF PSYCHOSOCIAL ASSESSMENT 07/22/2012  Patient:  THEODORA, LALANNE     Account Number:  [id number]     Admit date:  07/21/2012  Clinical Social Worker:  Hulan Fray  Date/Time:  07/22/2012 10:37 AM  Referred by:  RN  Date Referred:  07/21/2012 Referred for  SNF Placement   Other Referral:   Interview type:  Patient Other interview type:    PSYCHOSOCIAL DATA Living Status:  FACILITY Admitted from facility:  [facility] Level of care:  Skilled Nursing Facility Primary support name:  Gardiner Rhyme Primary support relationship to patient:  SIBLING Degree of support available:   supportive    CURRENT CONCERNS Current Concerns  Post-Acute Placement   Other Concerns:    SOCIAL WORK ASSESSMENT / PLAN Clinical Social Worker received referral for patient being admitted from facility. CSW introduced self and explained reason for visit. Patient confirmed that she is a long term resident of [facility] and plans to return back at discharge.    CSW will complete FL2 for MD's signature and will continue to follow to facilitate discharge back to facility.   Assessment/plan status:  Psychosocial Support/Ongoing Assessment of Needs Other assessment/ plan:   Information/referral to community resources:   Patient is from SNF  [facility]    PATIENTS/FAMILYS RESPONSE TO PLAN OF CARE: Patient reported that she plans to return back to [facility] when medically stable. Patient was appreciative of CSW's visit and assistance.

## 2012-07-23 DIAGNOSIS — B965 Pseudomonas (aeruginosa) (mallei) (pseudomallei) as the cause of diseases classified elsewhere: Secondary | ICD-10-CM

## 2012-07-23 DIAGNOSIS — R0902 Hypoxemia: Secondary | ICD-10-CM

## 2012-07-23 LAB — BASIC METABOLIC PANEL
BUN: 20 mg/dL (ref 6–23)
Calcium: 8.6 mg/dL (ref 8.4–10.5)
Creatinine, Ser: 1.14 mg/dL — ABNORMAL HIGH (ref 0.50–1.10)
GFR calc non Af Amer: 56 mL/min — ABNORMAL LOW (ref >90)
Glucose, Bld: 113 mg/dL — ABNORMAL HIGH (ref 70–99)
Sodium: 138 mEq/L (ref 135–145)

## 2012-07-23 LAB — CBC
HCT: 30.3 % — ABNORMAL LOW (ref 36.0–46.0)
Hemoglobin: 8.9 g/dL — ABNORMAL LOW (ref 12.0–15.0)
MCH: 22.6 pg — ABNORMAL LOW (ref 26.0–34.0)
MCHC: 29.4 g/dL — ABNORMAL LOW (ref 30.0–36.0)
MCV: 76.9 fL — ABNORMAL LOW (ref 78.0–100.0)

## 2012-07-23 LAB — URINE CULTURE: Colony Count: >=100000

## 2012-07-23 MED ORDER — LEVOFLOXACIN 750 MG PO TABS
750.0000 mg | ORAL_TABLET | ORAL | Status: DC
  Administered 2012-07-23: 750 mg via ORAL
  Filled 2012-07-23 (×3): qty 1

## 2012-07-23 MED ORDER — VANCOMYCIN HCL IN DEXTROSE 750-5 MG/150ML-% IV SOLN
750.0000 mg | Freq: Two times a day (BID) | INTRAVENOUS | Status: DC
  Filled 2012-07-23: qty 150

## 2012-07-23 MED ORDER — HEPARIN SOD (PORK) LOCK FLUSH 100 UNIT/ML IV SOLN
500.0000 [IU] | INTRAVENOUS | Status: AC | PRN
  Administered 2012-07-23: 500 [IU]

## 2012-07-23 MED ORDER — LEVOFLOXACIN 750 MG PO TABS: 750.0000 mg | ORAL_TABLET | ORAL | Status: AC

## 2012-07-23 NOTE — Clinical Social Work Note (Signed)
Clinical Social Worker facilitated discharge by contacted the facility regarding discharge today. Patient will be transported via ambulance. CSW will complete discharge packet and place with shadow chart. CSW will sign off, as social work intervention is no longer needed.   Rozetta Nunnery MSW, [employer] [phone]

## 2012-07-23 NOTE — Progress Notes (Addendum)
Speech Language Pathology Dysphagia Treatment Patient Details Name: [name] MRN: [id number] DOB: 1965/05/06 Today's Date: 07/23/2012 Time: 2536-6440 SLP Time Calculation (min): 9 min  Assessment / Plan / Recommendation Clinical Impression  Pt seen for skilled SLP to assess tolerance of po diet, regular/thin.  Pt intake documented as 75% and pt denies any signs of dysphagia or aspiration.  Pt has undergone a FEES in the past 2013 with indications for regular diet.  Observed pt to consume tea via straw with timely swalow and clear voice.  Pt declined to consume any further po, stating she was not hungry and she has not slept in 3 days.    Pt's quadriparesis, positioning difficulties  and reliance on others for feeding increase her aspiration risk- pt does not appear to have dysphagia.   SLP educated pt to aspiration precautions to mitigate risk.    SLP to sign off as all education is completed with this pt.  SLP posted signs re: asp precautions and PMSV use.  Please reorder if indicated.      Diet Recommendation  Continue with Current Diet: Regular;Thin liquid    SLP Plan All goals met   Pertinent Vitals/Pain Afebrile, nonproductive cough - pt being suctioned PRN   Swallowing Goals  SLP Swallowing Goals Swallow Study Goal #1 - Progress: Met  General Temperature Spikes Noted: No Respiratory Status: Trach Behavior/Cognition: Alert;Cooperative Oral Cavity - Dentition: Dentures, top Patient Positioning: Upright in bed  Oral Cavity - Oral Hygiene   oral cavity clear, educated pt to importance of oral care  Dysphagia Treatment Treatment focused on: Skilled observation of diet tolerance Treatment Methods/Modalities: Skilled observation Patient observed directly with PO's: Yes Type of PO's observed: Thin liquids Feeding: Total assist Liquids provided via: Straw   GO     Sarah Bail Muir, MS [facility] SLP [phone]

## 2012-07-23 NOTE — Discharge Summary (Signed)
Physician Discharge Summary  [name] ZOX:[id number] DOB: 12/09/1965 DOA: 07/21/2012  PCP: Terald Sleeper, MD  Admit date: 07/21/2012 Discharge date: 07/23/2012  Time spent: 35 minutes  Recommendations for Outpatient Follow-up:  1) Continue with Current Diet: Regular;Thin liquid  2) Follow up with Dr. Jenne Pane [facility]) for trach inner tube change 3) Follow up with Dr. Windle Guard at [facility] for baclofen refill, to be refilled on 07/25/12   Discharge Diagnoses:  Active Problems:   Healthcare-associated pneumonia   Tracheostomy in place   Neurogenic bladder with chronic Foley catheter   Quadriplegia   Discharge Condition: Improved  Diet recommendation: Regular, thin liquid  Filed Weights   07/21/12 1702  Weight: 100.1 kg (220 lb 10.9 oz)    History of present illness:  [name] is a 47 y.o. female with quadriplegia, chronic respiratory failure status post tracheostomy, PEG tube with resident of [facility] skilled nursing facility was sent to the emergency room for evaluation for shortness of breath.  Patient reports increased shortness of breath for 2-3 days, nonproductive cough, feeling hot and cold, chills.  She denies increased secretions from the trach.  Upon evaluation the emergency room, she was noted to have a right lower lobe infiltrate.  She also has a chronic indwelling Foley catheter for neurogenic bladder, denies any urinary symptoms however does not have much sensations regardless.   Hospital Course:  The pt was started on empiric broad spectrum antibiotics. The patient remained afebrile with no significant leukocytosis. through the remainder of the hospital course. Of note, a UA was initially strongly suggestive of a florid UTI. A urine culture demonstrated polymicrobial organisms, which raises the question of a contaminant. The Foley cath was changed out during this course.   SLP was consulted with diet recs as noted  above.  Procedures:    Consultations:    Discharge Exam: Filed Vitals:   07/23/12 0010 07/23/12 0421 07/23/12 0545 07/23/12 1053  BP:   100/74   Pulse:  72 72   Temp:   98.9 F (37.2 C)   TempSrc:   Oral   Resp:  18 18   Height:      Weight:      SpO2: 100% 100% 100% 100%    General: Awake, in nad Cardiovascular: regular, s1, s2 Respiratory: normal resp effort, no wheezing  Discharge Instructions     Medication List    TAKE these medications       albuterol (2.5 MG/3ML) 0.083% nebulizer solution  Commonly known as:  PROVENTIL  Take 2.5 mg by nebulization every 6 (six) hours.     aspirin 325 MG tablet  Take 325 mg by mouth daily.     bisacodyl 10 MG suppository  Commonly known as:  DULCOLAX  Place 10 mg rectally every other day as needed for constipation.     cloNIDine 0.2 MG tablet  Commonly known as:  CATAPRES  Take 0.2 mg by mouth 2 (two) times daily.     docusate sodium 100 MG capsule  Commonly known as:  COLACE  Take 100 mg by mouth 2 (two) times daily.     ferrous sulfate 325 (65 FE) MG tablet  Take 325 mg by mouth daily with breakfast.     Flora-Q Caps  Take 1 capsule by mouth daily.     fluticasone 50 MCG/ACT nasal spray  Commonly known as:  FLONASE  Place 1 spray into the nose daily.     furosemide 20 MG tablet  Commonly known  as:  LASIX  Take 20 mg by mouth daily.     guaiFENesin 100 MG/5ML Soln  Commonly known as:  ROBITUSSIN  Take 15 mLs by mouth every 8 (eight) hours. scheduled     hydroxypropyl methylcellulose 2.5 % ophthalmic solution  Commonly known as:  ISOPTO TEARS  Place 1 drop into both eyes 4 (four) times daily as needed (for dry eyes). Dry eyes     ipratropium 0.02 % nebulizer solution  Commonly known as:  ATROVENT  Take 500 mcg by nebulization every 6 (six) hours.     ipratropium 17 MCG/ACT inhaler  Commonly known as:  ATROVENT HFA  Inhale 1 puff into the lungs every 6 (six) hours as needed for wheezing (and  shortness of breath).     levofloxacin 750 MG tablet  Commonly known as:  LEVAQUIN  Take 1 tablet (750 mg total) by mouth daily.     Melatonin 10 MG Caps  Take 10 mg by mouth at bedtime.     metoprolol tartrate 25 MG tablet  Commonly known as:  LOPRESSOR  Take 12.5 mg by mouth 2 (two) times daily.     OLANZapine 10 MG tablet  Commonly known as:  ZYPREXA  Take 10-20 mg by mouth 2 (two) times daily. Take 10 mg in the morning and 20 mg at bedtime     OSCAL 500/200 D-3 500-200 MG-UNIT per tablet  Generic drug:  calcium-vitamin D  Take 1 tablet by mouth every morning.     oxyCODONE 15 MG immediate release tablet  Commonly known as:  ROXICODONE  Take 15 mg by mouth See admin instructions. Take 1 tablet daily after breakfast and Take 1 tablet every 4 hours as needed for pain     polyethylene glycol packet  Commonly known as:  MIRALAX / GLYCOLAX  Take 17 g by mouth daily. constipation     pramipexole 0.125 MG tablet  Commonly known as:  MIRAPEX  Take 0.125 mg by mouth at bedtime.     PROTONIX 40 mg/20 mL Pack  Generic drug:  pantoprazole sodium  Take 40 mg by mouth daily.     risperiDONE 1 MG disintegrating tablet  Commonly known as:  RISPERDAL M-TABS  Take 1 mg by mouth 2 (two) times daily.     sucralfate 1 G tablet  Commonly known as:  CARAFATE  Take 1 tablet (1 g total) by mouth 4 (four) times daily -  with meals and at bedtime.     traMADol 50 MG tablet  Commonly known as:  ULTRAM  Take 1 tablet (50 mg total) by mouth 3 (three) times daily.     traZODone 150 MG tablet  Commonly known as:  DESYREL  Take 150 mg by mouth at bedtime.     traZODone 150 MG tablet  Commonly known as:  DESYREL  Take 75 mg by mouth at bedtime as needed (if waking in middle of the night).     venlafaxine 100 MG tablet  Commonly known as:  EFFEXOR  Take 100 mg by mouth 2 (two) times daily.       Allergies  Allergen Reactions  . Latex Other (See Comments)    Reaction unknown       The results of significant diagnostics from this hospitalization (including imaging, microbiology, ancillary and laboratory) are listed below for reference.    Significant Diagnostic Studies: Dg Chest Portable 1 View  07/21/2012   *RADIOLOGY REPORT*  Clinical Data: Shortness of breath  PORTABLE CHEST -  1 VIEW  Comparison: 03/11/2012  Findings: Tracheostomy catheter and left-sided port are again identified.  There is some mild deformity of the port catheter at its vein entry site likely related to mild pinch off syndrome.  The cardiac shadow is stable.  Increased density is noted in the right base consistent with an early infiltrate.  IMPRESSION: Early right basilar infiltrate.  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   Original Report Authenticated By: Alcide Clever, M.D.    Microbiology: Recent Results (from the past 240 hour(s))  CULTURE, BLOOD (ROUTINE X 2)     Status: None   Collection Time    07/21/12  1:24 PM      Result Value Range Status   Specimen Description BLOOD HAND RIGHT   Final   Special Requests BOTTLES DRAWN AEROBIC ONLY 1.5ML   Final   Culture  Setup Time 07/21/2012 16:38   Final   Culture     Final   Value:        BLOOD CULTURE RECEIVED NO GROWTH TO DATE CULTURE WILL BE HELD FOR 5 DAYS BEFORE ISSUING A FINAL NEGATIVE REPORT   Report Status PENDING   Incomplete  URINE CULTURE     Status: None   Collection Time    07/21/12  1:39 PM      Result Value Range Status   Specimen Description URINE, CATHETERIZED   Final   Special Requests NONE   Final   Culture  Setup Time 07/21/2012 14:28   Final   Colony Count >=100,000 COLONIES/ML   Final   Culture     Final   Value: Multiple bacterial morphotypes present, none predominant. Suggest appropriate recollection if clinically indicated.   Report Status 07/23/2012 FINAL   Final  CULTURE, BLOOD (ROUTINE X 2)     Status: None   Collection Time    07/21/12  3:10 PM      Result Value Range  Status   Specimen Description BLOOD   Final   Special Requests     Final   Value: BOTTLES DRAWN AEROBIC AND ANAEROBIC 10CC FROM LEFT CHEST PORT   Culture  Setup Time 07/22/2012 03:48   Final   Culture     Final   Value:        BLOOD CULTURE RECEIVED NO GROWTH TO DATE CULTURE WILL BE HELD FOR 5 DAYS BEFORE ISSUING A FINAL NEGATIVE REPORT   Report Status PENDING   Incomplete  CULTURE, RESPIRATORY (NON-EXPECTORATED)     Status: None   Collection Time    07/21/12 10:10 PM      Result Value Range Status   Specimen Description TRACHEAL ASPIRATE   Final   Special Requests NONE   Final   Gram Stain     Final   Value: FEW WBC PRESENT, PREDOMINANTLY PMN     RARE SQUAMOUS EPITHELIAL CELLS PRESENT     RARE GRAM POSITIVE COCCI     IN PAIRS RARE GRAM POSITIVE RODS   Culture Non-Pathogenic Oropharyngeal-type Flora Isolated.   Final   Report Status PENDING   Incomplete     Labs: Basic Metabolic Panel:  Recent Labs Lab 07/21/12 1529 07/22/12 0430 07/23/12 0535  NA 139 136 138  K 4.4 4.1 4.8  CL 99 98 101  CO2 30 28 29   GLUCOSE 86 146* 113*  BUN 23 21 20   CREATININE 0.96 0.93 1.14*  CALCIUM 9.2 8.8 8.6   Liver Function Tests:  Recent Labs  Lab 07/21/12 1529 07/22/12 0430  AST 10 8  ALT 11 10  ALKPHOS 88 77  BILITOT 0.2* 0.2*  PROT 7.9 7.2  ALBUMIN 3.4* 2.9*   No results found for this basename: LIPASE, AMYLASE,  in the last 168 hours No results found for this basename: AMMONIA,  in the last 168 hours CBC:  Recent Labs Lab 07/21/12 1529 07/22/12 0430 07/23/12 0535  WBC 11.4* 10.5 9.7  NEUTROABS 7.6  --   --   HGB 10.5* 9.6* 8.9*  HCT 34.1* 32.2* 30.3*  MCV 74.9* 75.4* 76.9*  PLT 268 239 196   Cardiac Enzymes: No results found for this basename: CKTOTAL, CKMB, CKMBINDEX, TROPONINI,  in the last 168 hours BNP: BNP (last 3 results)  Recent Labs  10/25/11 0500 11/20/11 0442  PROBNP 108.3 429.3*   CBG: No results found for this basename: GLUCAP,  in the last  168 hours     Signed:  Kaymen Adrian K  [facility] 07/23/2012, 12:29 PM

## 2012-07-23 NOTE — Progress Notes (Addendum)
ANTIBIOTIC CONSULT NOTE - FOLLOW UP  Pharmacy Consult for vancomycin, cefepime, levofloxacin Indication: pneumonia  Allergies  Allergen Reactions  . Latex Other (See Comments)    Reaction unknown    Patient Measurements: Height: 5' 4.17" (163 cm) Weight: 220 lb 10.9 oz (100.1 kg) IBW/kg (Calculated) : 55.1   Vital Signs: Temp: 98.9 F (37.2 C) (06/11 0545) Temp src: Oral (06/11 0545) BP: 100/74 mmHg (06/11 0545) Pulse Rate: 72 (06/11 0545) Intake/Output from previous day: 06/10 0701 - 06/11 0700 In: 810 [P.O.:600; I.V.:160; IV Piggyback:50] Out: 5350 [Urine:5350] Intake/Output from this shift:    Labs:  Recent Labs  07/21/12 1529 07/22/12 0430 07/23/12 0535  WBC 11.4* 10.5 9.7  HGB 10.5* 9.6* 8.9*  PLT 268 239 196  CREATININE 0.96 0.93 1.14*   Estimated Creatinine Clearance: 70.4 ml/min (by C-G formula based on Cr of 1.14).  Recent Labs  07/23/12 0535  VANCOTROUGH 26.3*     Microbiology: Recent Results (from the past 720 hour(s))  CULTURE, BLOOD (ROUTINE X 2)     Status: None   Collection Time    07/21/12  1:24 PM      Result Value Range Status   Specimen Description BLOOD HAND RIGHT   Final   Special Requests BOTTLES DRAWN AEROBIC ONLY 1.5ML   Final   Culture  Setup Time 07/21/2012 16:38   Final   Culture     Final   Value:        BLOOD CULTURE RECEIVED NO GROWTH TO DATE CULTURE WILL BE HELD FOR 5 DAYS BEFORE ISSUING A FINAL NEGATIVE REPORT   Report Status PENDING   Incomplete  URINE CULTURE     Status: None   Collection Time    07/21/12  1:39 PM      Result Value Range Status   Specimen Description URINE, CATHETERIZED   Final   Special Requests NONE   Final   Culture  Setup Time 07/21/2012 14:28   Final   Colony Count >=100,000 COLONIES/ML   Final   Culture     Final   Value: Multiple bacterial morphotypes present, none predominant. Suggest appropriate recollection if clinically indicated.   Report Status 07/23/2012 FINAL   Final  CULTURE,  RESPIRATORY (NON-EXPECTORATED)     Status: None   Collection Time    07/21/12 10:10 PM      Result Value Range Status   Specimen Description TRACHEAL ASPIRATE   Final   Special Requests NONE   Final   Gram Stain     Final   Value: FEW WBC PRESENT, PREDOMINANTLY PMN     RARE SQUAMOUS EPITHELIAL CELLS PRESENT     RARE GRAM POSITIVE COCCI     IN PAIRS RARE GRAM POSITIVE RODS   Culture Non-Pathogenic Oropharyngeal-type Flora Isolated.   Final   Report Status PENDING   Incomplete    Anti-infectives   Start     Dose/Rate Route Frequency Ordered Stop   07/22/12 0500  vancomycin (VANCOCIN) IVPB 1000 mg/200 mL premix     1,000 mg 200 mL/hr over 60 Minutes Intravenous Every 12 hours 07/21/12 1838     07/21/12 2200  ceFEPIme (MAXIPIME) 1 g in dextrose 5 % 50 mL IVPB     1 g 100 mL/hr over 30 Minutes Intravenous 3 times per day 07/21/12 1838     07/21/12 1900  levofloxacin (LEVAQUIN) IVPB 750 mg     750 mg 100 mL/hr over 90 Minutes Intravenous Every 24 hours 07/21/12 1838  07/21/12 1845  vancomycin (VANCOCIN) IVPB 1000 mg/200 mL premix  Status:  Discontinued     1,000 mg 200 mL/hr over 60 Minutes Intravenous NOW 07/21/12 1838 07/22/12 0855   07/21/12 1330  piperacillin-tazobactam (ZOSYN) IVPB 3.375 g     3.375 g 12.5 mL/hr over 240 Minutes Intravenous  Once 07/21/12 1325 07/21/12 1653   07/21/12 1330  vancomycin (VANCOCIN) IVPB 1000 mg/200 mL premix     1,000 mg 200 mL/hr over 60 Minutes Intravenous  Once 07/21/12 1325 07/21/12 1821      Assessment: 47 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  Now on antibiotics for empiric HCAP.    Vancomycin trough slightly greater than goal at 26.3 on vancomycin 1g IV q12h.  Note, last vancomycin dose administered at 1752 6/10 pm.    SCr noted to have increased slightly -- 0.96>>1.14 this am, resulting in CrCl~70 ml/min (though likely does not accurately reflect clearance in this  quadrapletic patient).  UOP 2.2 ml/kg/hr overnight (no lasix noted).  WBC dec 11.4>>9.7.  Goal of Therapy:  Vancomycin trough level 15-20 mcg/ml  Plan:  - Decrease vancomycin to 750 mg IV q12h -- start next dose at 1200 (anticipated trough ~19) - Check trough at new Css, prior to 4th dose - Continue cefepime 1g IV q8h - Continue levofloxacin 750 mg IV q24h - Follow up SCr, UOP, cultures, clinical course and adjust as clinically indicated - Also noted [facility] MD contact with Dr Windle Guard and plans for hopeful d/c back to [facility] Thursday with plans to return to his office for baclofen pump exchange Friday otherwise planned tx in ambulance to Dr Karle Plumber office for pump exchange.  Please let pharmacy know if we can be of assistance.  Thanks,  Marchelle Folks. Illene Bolus, PharmD, BCPS Clinical Pharmacist Pager: [phone] Pharmacy: [phone] 07/23/2012 8:21 AM

## 2012-07-24 ENCOUNTER — Other Ambulatory Visit: Payer: Self-pay | Admitting: *Deleted

## 2012-07-24 LAB — CULTURE, RESPIRATORY W GRAM STAIN

## 2012-07-24 MED ORDER — TEMAZEPAM 15 MG PO CAPS: ORAL_CAPSULE | ORAL | Status: DC

## 2012-07-27 LAB — CULTURE, BLOOD (ROUTINE X 2): Culture: NO GROWTH

## 2012-07-28 ENCOUNTER — Non-Acute Institutional Stay (SKILLED_NURSING_FACILITY): Payer: Medicare Other | Admitting: Internal Medicine

## 2012-07-28 DIAGNOSIS — D509 Iron deficiency anemia, unspecified: Secondary | ICD-10-CM

## 2012-07-28 DIAGNOSIS — J189 Pneumonia, unspecified organism: Secondary | ICD-10-CM

## 2012-07-28 DIAGNOSIS — F209 Schizophrenia, unspecified: Secondary | ICD-10-CM

## 2012-07-28 DIAGNOSIS — I10 Essential (primary) hypertension: Secondary | ICD-10-CM

## 2012-07-28 LAB — CULTURE, BLOOD (ROUTINE X 2)

## 2012-07-29 ENCOUNTER — Other Ambulatory Visit: Payer: Self-pay | Admitting: *Deleted

## 2012-07-29 MED ORDER — OXYCODONE HCL 15 MG PO TABS: ORAL_TABLET | ORAL | Status: DC

## 2012-08-06 ENCOUNTER — Non-Acute Institutional Stay (SKILLED_NURSING_FACILITY): Payer: Medicare Other | Admitting: Internal Medicine

## 2012-08-06 DIAGNOSIS — N179 Acute kidney failure, unspecified: Secondary | ICD-10-CM

## 2012-08-06 DIAGNOSIS — D509 Iron deficiency anemia, unspecified: Secondary | ICD-10-CM

## 2012-08-07 NOTE — Progress Notes (Signed)
Patient ID: [name], female   DOB: 1965-08-15, 47 y.o.   MRN: [id number]        PROGRESS NOTE  DATE: 07/21/2012  FACILITY:  [facility]  LEVEL OF CARE: SNF (31)  Acute Visit  CHIEF COMPLAINT:  Manage chest pain and shortness of breath.    HISTORY OF PRESENT ILLNESS: I was requested by the staff to assess the patient regarding above problem(s):  Staff report that patient is complaining of mid sternal chest pain which is nonradiating.  Patient does admit to the chest pain, which is constant.  It started this morning.  She does admit to increased shortness of breath but denies palpitations, nausea or vomiting.  She denies heartburn, as well.  She does have diaphoresis.  Patient cannot identify precipitating or alleviating factors.    PAST MEDICAL HISTORY : Reviewed.  No changes.  CURRENT MEDICATIONS: Reviewed per [facility]  REVIEW OF SYSTEMS:  Overall, difficult to obtain.  Patient is a poor historian.    PHYSICAL EXAMINATION  VS:  T 98       P 80      RR 18      BP 160/80     POX %       WT (Lb)  GENERAL: no acute distress, morbidly obese body habitus EYES: conjunctivae normal, sclerae normal, normal eye lids NECK: patient has a trach LYMPHATICS: no LAN in the neck, no supraclavicular LAN RESPIRATORY: decreased breath sounds bilaterally CARDIAC: RRR, no murmur,no extra heart sounds EDEMA/VARICOSITIES:  +2 bilateral lower extremity edema  ARTERIAL:  pedal pulses nonpalpable   GI: abdomen soft, normal BS, no masses, no tenderness, no hepatomegaly, no splenomegaly PSYCHIATRIC: the patient is alert & oriented to person, affect & behavior appropriate NEUROLOGICAL:  Patient does not cooperate well to perform a full neuro exam.  She is alert, oriented, and responsive to some questions.   MUSCULOSKELETAL:  Patient, again, is not fully cooperative.  She is quadriplegic, as well.   SKIN: warm & dry, no suspicious lesions or rashes, no excessive dryness    ASSESSMENT/PLAN:  Mid sternal chest pain and shortness of breath.  New onset.  Significant problem.  I am concerned of a cardiac etiology.  Therefore, we will send her to the emergency room.  She will need a CBC with diff, CMP, EKG, chest x-ray at initial evaluation.  Other concerns are PE.    CPT CODE: 40981

## 2012-08-18 ENCOUNTER — Non-Acute Institutional Stay (SKILLED_NURSING_FACILITY): Payer: Medicare Other | Admitting: Internal Medicine

## 2012-08-18 DIAGNOSIS — N179 Acute kidney failure, unspecified: Secondary | ICD-10-CM

## 2012-08-19 DIAGNOSIS — D509 Iron deficiency anemia, unspecified: Secondary | ICD-10-CM | POA: Insufficient documentation

## 2012-08-19 DIAGNOSIS — I10 Essential (primary) hypertension: Secondary | ICD-10-CM | POA: Insufficient documentation

## 2012-08-19 NOTE — Progress Notes (Signed)
Patient ID: [name], female   DOB: [birth year], 47 y.o.   MRN: [id number]        HISTORY & PHYSICAL  DATE: 07/28/2012   FACILITY: [facility]  LEVEL OF CARE: SNF (31)  ALLERGIES:  Allergies  Allergen Reactions  . Latex Other (See Comments)    Reaction unknown    CHIEF COMPLAINT:  Manage hypertension, pneumonia, and  schizophrenia.   HISTORY OF PRESENT ILLNESS:  The patient is a 47 year-old, African-American female who was hospitalized secondary to shortness of breath, and then after hospitalization admitted to this facility for long-term care management.  She has the following problems:    PNEUMONIA: The pneumonia remains stable.  The patient denies ongoing chest pain, cough, shortness of breath, fever, chills or night sweats. No complications reported from the current antibiotic being used.   HTN: Pt 's HTN remains stable.  Denies CP, sob, DOE, pedal edema, headaches, dizziness or visual disturbances.  No complications from the medications currently being used.  Last BP :  130/80.  SCHIZOPHRENIA: The patient is complaining of worms all over her body and they are eating her.  No complications reported from the medications currently being used.   PAST MEDICAL HISTORY :  Past Medical History  Diagnosis Date  . Respiratory failure   . Dysphagia   . Contracture of hand joint   . Anemia   . Neurogenic bladder   . Anxiety   . Schizophrenia   . Quadriplegia   . Hypertension   . CHF (congestive heart failure)   . Anginal pain   . Right leg DVT 1980's  . Pneumonia     "today; I get it alot" (07/21/2012)  . History of blood transfusion     "I've had a few" (07/21/2012)  . GERD (gastroesophageal reflux disease)   . Arthritis     "in my hands" (07/21/2012)  . Depression   . Kidney stones     "about 4; 2 different times" (6/92014)  . On home oxygen therapy     "38% q hs" (07/21/2012)    PAST SURGICAL HISTORY: Past Surgical History  Procedure Laterality Date   . Tracheostomy  2011  . Gastrostomy w/ feeding tube    . Tubal ligation  1996  . Cystoscopy/retrograde/ureteroscopy/stone extraction with basket    . Lithotripsy    . Peg placement      SOCIAL HISTORY:  reports that she quit smoking about 5 years ago. Her smoking use included Cigarettes. She has a 8 pack-year smoking history. She has never used smokeless tobacco. She reports that she does not drink alcohol or use illicit drugs.  FAMILY HISTORY: None  CURRENT MEDICATIONS: Reviewed per [facility]  REVIEW OF SYSTEMS:  See HPI otherwise 14 point ROS is negative.  PHYSICAL EXAMINATION  VS:  T 98.3       P 90      RR 20      BP 130/80      POX 97% on trach        WT (Lb) 227  GENERAL: no acute distress, morbidly obese body habitus SKIN: warm & dry, no suspicious lesions or rashes, no excessive dryness EYES: conjunctivae normal, sclerae normal, normal eye lids MOUTH/THROAT: lips without lesions,no lesions in the mouth,tongue is without lesions,uvula elevates in midline NECK: has a trach LYMPHATICS: no LAN in the neck, no supraclavicular LAN RESPIRATORY: breathing is even & unlabored, BS CTAB CARDIAC: RRR, no murmur,no extra heart sounds EDEMA/VARICOSITIES:  +  2 bilateral lower extremity edema  ARTERIAL:  pedal pulses +1 GI:  ABDOMEN: abdomen soft, normal BS, no masses, no tenderness  LIVER/SPLEEN: no hepatomegaly, no splenomegaly MUSCULOSKELETAL: unable to assess, patient is quadriplegic PSYCHIATRIC: the patient is alert & oriented to person, affect & behavior appropriate  LABS/RADIOLOGY: Chest x-ray showed early right basilar infiltrate.   Blood culture x2 showed no growth.    Respiratory culture showed oropharyngeal flora.    Glucose 113, creatinine 1.14, otherwise BMP normal.    Albumin 2.9, otherwise liver profile normal.    Hemoglobin 8.9, MCV 76.9, otherwise CBC normal.    ASSESSMENT/PLAN:  Pneumonia.  Continue Levaquin as prescribed.    Hypertension.  Well controlled.     Schizophrenia.  Patient is having psychotic symptoms.  We will request Psych service to adjust medications.  She is already on multiple antipsychotics at higher doses.    Iron deficiency anemia.  Continue iron.  Reassess hemoglobin level.    Chronic respiratory failure.  Continue tracheostomy.   Quadriplegia.  Continue supportive care.   Constipation.  Continue current laxatives.    Check CBC and BMP.   I have reviewed patient's medical records received at admission/from hospitalization.  CPT CODE: 16109

## 2012-09-03 ENCOUNTER — Non-Acute Institutional Stay (SKILLED_NURSING_FACILITY): Payer: Medicare Other | Admitting: Internal Medicine

## 2012-09-03 DIAGNOSIS — E781 Pure hyperglyceridemia: Secondary | ICD-10-CM | POA: Insufficient documentation

## 2012-09-03 DIAGNOSIS — J9610 Chronic respiratory failure, unspecified whether with hypoxia or hypercapnia: Secondary | ICD-10-CM | POA: Insufficient documentation

## 2012-09-03 DIAGNOSIS — D509 Iron deficiency anemia, unspecified: Secondary | ICD-10-CM

## 2012-09-03 DIAGNOSIS — I10 Essential (primary) hypertension: Secondary | ICD-10-CM

## 2012-09-03 NOTE — Progress Notes (Signed)
Patient ID: [name], female   DOB: 08/19/1965, 47 y.o.   MRN: [id number]        PROGRESS NOTE  DATE:  09/03/2012  FACILITY: [facility]   LEVEL OF CARE: SNF  Routine Visit  CHIEF COMPLAINT:  Manage iron deficiency anemia, hypertriglyceridemia and hypertension.   HISTORY OF PRESENT ILLNESS:    REASSESSMENT OF ONGOING PROBLEM(S):  HYPERTRIGLYCERIDEMIA:   In 03/2012:  Triglycerides 258, otherwise fasting lipid panel normal.  No complications reported from the medications presently being used.    ANEMIA:  On 05/16/2012:  Patient's hemoglobin was 10.1, MCV 73.  On 04/28/2012:  Hemoglobin was 10.4, and 6/14 hemoglobin 9.3.  The patient denies fatigue, melena or hematochezia. No complications from the medications currently being used.   HTN: Pt 's HTN remains stable.  Denies CP, sob, DOE, headaches, dizziness or visual disturbances.  No complications from the medications currently being used.  Last BP : 150/80,  120/62, 114/62, 140/80.  PAST MEDICAL HISTORY : Reviewed.  No changes.  CURRENT MEDICATIONS: Reviewed per [facility]  REVIEW OF SYSTEMS:  Difficult to obtain.  Patient is a poor historian today.    PHYSICAL EXAMINATION  VS:  T 98.8     P84     RR 18   BP  140/80   POX %95 on trach     WT (Lb) 231  GENERAL: no acute distress, morbidly obese body habitus EYES: conjunctivae normal, sclerae normal, normal eye lids NECK: patient has a trach LYMPHATICS: no LAN in the neck, no supraclavicular LAN RESPIRATORY: breathing is even & unlabored, BS CTAB CARDIAC: RRR, no murmur,no extra heart sounds EDEMA/VARICOSITIES:  +2 bilateral lower extremity edema  ARTERIAL:  pedal pulses nonpalpable  GI: abdomen soft, normal BS, no masses, no tenderness, no hepatomegaly, no splenomegaly PSYCHIATRIC: the patient is alert & oriented to person, affect & behavior appropriate  LABS/RADIOLOGY: 6/14 BUN 30, creatinine 1.25, hemoglobin 9.3, MCV 75 otherwise CBC normal, hemoglobin A1c 6 In 05/2012:   Hemoglobin 10.1, MCV 73, otherwise CBC normal.    CMP normal.  In 04/2012:  Hemoglobin A1C  5.7.    ASSESSMENT/PLAN:   Hypertriglyceridemia.  Triglycerides are stabilizing.  Continue current medications.    Iron deficiency anemia.  Hemoglobin declined.  We will monitor.    Hypertension.  Last BP borderline. Will monitor.  Chronic respiratory failure.  Continue trach support.   Quadriplegia.  Continue supportive care.   Schizophrenia.  Stable. Geodon added.  GERD.  Continue Protonix.   Constipation.  Continue current laxatives.    Chronic pain.  Currently on routine Ultram.    CPT CODE: 40981

## 2012-09-08 NOTE — Progress Notes (Signed)
Patient ID: [name], female   DOB: 06-25-1965, 47 y.o.   MRN: [id number]        PROGRESS NOTE  DATE: 08/06/2012  FACILITY:  [facility]  LEVEL OF CARE: SNF (31)  Acute Visit  CHIEF COMPLAINT:  Manage iron deficiency anemia and acute renal failure.    HISTORY OF PRESENT ILLNESS: I was requested by the staff to assess the patient regarding above problem(s):  ANEMIA: The anemia is unstable. The patient denies fatigue, melena or hematochezia. No complications from the medications currently being used.  On 08/04/2012:  Hemoglobin 9.3, MCV 75.  In 06/2012:   Hemoglobin 9.7, MCV 74.  The anemia is secondary to iron deficiency.    ACUTE RENAL FAILURE:  On 08/04/2012:  BUN 32, creatinine 1.18.  In 06/2012:  BUN 21, creatinine 0.89.  Patient is on Lasix.  Staff do not report increasing confusion or increasing lower extremity swelling.    PAST MEDICAL HISTORY : Reviewed.  No changes.  CURRENT MEDICATIONS: Reviewed per [facility]  REVIEW OF SYSTEMS:  Difficult to obtain.  Patient is a poor historian.    PHYSICAL EXAMINATION  GENERAL: no acute distress, morbidly obese body habitus EYES: conjunctivae normal, sclerae normal, normal eye lids NECK: patient has a trach LYMPHATICS: no LAN in the neck, no supraclavicular LAN RESPIRATORY: breathing is even & unlabored, BS CTAB CARDIAC: RRR, no murmur,no extra heart sounds EDEMA/VARICOSITIES:  +2 bilateral lower extremity pitting edema  ARTERIAL:  pedal pulses nonpalpable   GI: abdomen soft, normal BS, no masses, no tenderness, no hepatomegaly, no splenomegaly PSYCHIATRIC: the patient is alert & oriented to person, affect & behavior appropriate  ASSESSMENT/PLAN:  Acute renal failure.  New onset.  Significant problem.  Given her edema, I am hesitant to discontinue Lasix.  We will reassess renal functions on 08/08/2012.    Iron deficiency anemia.  Unstable problem.  Hemoglobin declined.  Continue iron.  We will monitor.   CPT  CODE: 11914

## 2012-09-09 ENCOUNTER — Encounter (HOSPITAL_COMMUNITY): Payer: Self-pay

## 2012-09-09 ENCOUNTER — Emergency Department (HOSPITAL_COMMUNITY)
Admission: EM | Admit: 2012-09-09 | Discharge: 2012-09-09 | Disposition: A | Discharge status: Home / Self Care | Payer: Medicare Other | Attending: Emergency Medicine | Admitting: Emergency Medicine

## 2012-09-09 ENCOUNTER — Emergency Department (HOSPITAL_COMMUNITY): Payer: Medicare Other

## 2012-09-09 DIAGNOSIS — I509 Heart failure, unspecified: Secondary | ICD-10-CM | POA: Insufficient documentation

## 2012-09-09 DIAGNOSIS — R0602 Shortness of breath: Secondary | ICD-10-CM | POA: Insufficient documentation

## 2012-09-09 DIAGNOSIS — R112 Nausea with vomiting, unspecified: Secondary | ICD-10-CM | POA: Insufficient documentation

## 2012-09-09 DIAGNOSIS — I10 Essential (primary) hypertension: Secondary | ICD-10-CM | POA: Insufficient documentation

## 2012-09-09 DIAGNOSIS — Z7982 Long term (current) use of aspirin: Secondary | ICD-10-CM | POA: Insufficient documentation

## 2012-09-09 DIAGNOSIS — Z87442 Personal history of urinary calculi: Secondary | ICD-10-CM | POA: Insufficient documentation

## 2012-09-09 DIAGNOSIS — M129 Arthropathy, unspecified: Secondary | ICD-10-CM | POA: Insufficient documentation

## 2012-09-09 DIAGNOSIS — Z87891 Personal history of nicotine dependence: Secondary | ICD-10-CM | POA: Insufficient documentation

## 2012-09-09 DIAGNOSIS — Z8739 Personal history of other diseases of the musculoskeletal system and connective tissue: Secondary | ICD-10-CM | POA: Insufficient documentation

## 2012-09-09 DIAGNOSIS — R05 Cough: Secondary | ICD-10-CM

## 2012-09-09 DIAGNOSIS — Z8669 Personal history of other diseases of the nervous system and sense organs: Secondary | ICD-10-CM | POA: Insufficient documentation

## 2012-09-09 DIAGNOSIS — K219 Gastro-esophageal reflux disease without esophagitis: Secondary | ICD-10-CM | POA: Insufficient documentation

## 2012-09-09 DIAGNOSIS — R300 Dysuria: Secondary | ICD-10-CM | POA: Insufficient documentation

## 2012-09-09 DIAGNOSIS — R0789 Other chest pain: Secondary | ICD-10-CM | POA: Insufficient documentation

## 2012-09-09 DIAGNOSIS — Z9889 Other specified postprocedural states: Secondary | ICD-10-CM | POA: Insufficient documentation

## 2012-09-09 DIAGNOSIS — Z8701 Personal history of pneumonia (recurrent): Secondary | ICD-10-CM | POA: Insufficient documentation

## 2012-09-09 DIAGNOSIS — R509 Fever, unspecified: Secondary | ICD-10-CM | POA: Insufficient documentation

## 2012-09-09 DIAGNOSIS — F3289 Other specified depressive episodes: Secondary | ICD-10-CM | POA: Insufficient documentation

## 2012-09-09 DIAGNOSIS — F329 Major depressive disorder, single episode, unspecified: Secondary | ICD-10-CM | POA: Insufficient documentation

## 2012-09-09 DIAGNOSIS — F411 Generalized anxiety disorder: Secondary | ICD-10-CM | POA: Insufficient documentation

## 2012-09-09 DIAGNOSIS — IMO0002 Reserved for concepts with insufficient information to code with codable children: Secondary | ICD-10-CM | POA: Insufficient documentation

## 2012-09-09 DIAGNOSIS — Z8709 Personal history of other diseases of the respiratory system: Secondary | ICD-10-CM | POA: Insufficient documentation

## 2012-09-09 DIAGNOSIS — Z87448 Personal history of other diseases of urinary system: Secondary | ICD-10-CM | POA: Insufficient documentation

## 2012-09-09 DIAGNOSIS — D649 Anemia, unspecified: Secondary | ICD-10-CM | POA: Insufficient documentation

## 2012-09-09 DIAGNOSIS — Z79899 Other long term (current) drug therapy: Secondary | ICD-10-CM | POA: Insufficient documentation

## 2012-09-09 DIAGNOSIS — Z931 Gastrostomy status: Secondary | ICD-10-CM | POA: Insufficient documentation

## 2012-09-09 DIAGNOSIS — Z8679 Personal history of other diseases of the circulatory system: Secondary | ICD-10-CM | POA: Insufficient documentation

## 2012-09-09 DIAGNOSIS — F209 Schizophrenia, unspecified: Secondary | ICD-10-CM | POA: Insufficient documentation

## 2012-09-09 DIAGNOSIS — Z930 Tracheostomy status: Secondary | ICD-10-CM | POA: Insufficient documentation

## 2012-09-09 DIAGNOSIS — Z91040 Latex allergy status: Secondary | ICD-10-CM | POA: Insufficient documentation

## 2012-09-09 DIAGNOSIS — R059 Cough, unspecified: Secondary | ICD-10-CM

## 2012-09-09 DIAGNOSIS — N390 Urinary tract infection, site not specified: Secondary | ICD-10-CM

## 2012-09-09 LAB — URINALYSIS, ROUTINE W REFLEX MICROSCOPIC
Bilirubin Urine: NEGATIVE
Nitrite: POSITIVE — AB
Specific Gravity, Urine: 1.011 (ref 1.005–1.030)
pH: 7 (ref 5.0–8.0)

## 2012-09-09 LAB — BASIC METABOLIC PANEL
BUN: 18 mg/dL (ref 6–23)
Chloride: 97 mEq/L (ref 96–112)
GFR calc Af Amer: 78 mL/min — ABNORMAL LOW (ref >90)
Potassium: 4 mEq/L (ref 3.5–5.1)

## 2012-09-09 LAB — HEPATIC FUNCTION PANEL
ALT: 12 U/L (ref 0–35)
AST: 9 U/L (ref 0–37)
Albumin: 3.2 g/dL — ABNORMAL LOW (ref 3.5–5.2)
Alkaline Phosphatase: 72 U/L (ref 39–117)
Total Protein: 7.1 g/dL (ref 6.0–8.3)

## 2012-09-09 LAB — CBC
HCT: 33.1 % — ABNORMAL LOW (ref 36.0–46.0)
Hemoglobin: 10.2 g/dL — ABNORMAL LOW (ref 12.0–15.0)
RBC: 4.45 MIL/uL (ref 3.87–5.11)
RDW: 13.1 % (ref 11.5–15.5)
WBC: 8.1 10*3/uL (ref 4.0–10.5)

## 2012-09-09 LAB — DIFFERENTIAL
Lymphocytes Relative: 39 % (ref 12–46)
Lymphs Abs: 3.2 10*3/uL (ref 0.7–4.0)
Neutro Abs: 4 10*3/uL (ref 1.7–7.7)
Neutrophils Relative %: 49 % (ref 43–77)

## 2012-09-09 LAB — URINE MICROSCOPIC-ADD ON

## 2012-09-09 MED ORDER — ONDANSETRON HCL 4 MG/2ML IJ SOLN
4.0000 mg | Freq: Once | INTRAMUSCULAR | Status: AC
  Administered 2012-09-09: 4 mg via INTRAVENOUS
  Filled 2012-09-09: qty 2

## 2012-09-09 MED ORDER — HEPARIN SOD (PORK) LOCK FLUSH 100 UNIT/ML IV SOLN
INTRAVENOUS | Status: AC
  Administered 2012-09-09: 100 [IU]
  Filled 2012-09-09: qty 5

## 2012-09-09 MED ORDER — LEVOFLOXACIN IN D5W 750 MG/150ML IV SOLN
750.0000 mg | Freq: Once | INTRAVENOUS | Status: AC
  Administered 2012-09-09: 750 mg via INTRAVENOUS
  Filled 2012-09-09: qty 150

## 2012-09-09 MED ORDER — LEVOFLOXACIN 750 MG PO TABS: 750.0000 mg | ORAL_TABLET | Freq: Every day | ORAL | Status: DC

## 2012-09-09 MED ORDER — MORPHINE SULFATE 4 MG/ML IJ SOLN
4.0000 mg | Freq: Once | INTRAMUSCULAR | Status: AC
Start: 2012-09-09 — End: 2012-09-09
  Administered 2012-09-09: 4 mg via INTRAVENOUS
  Filled 2012-09-09: qty 1

## 2012-09-09 MED ORDER — MORPHINE SULFATE 4 MG/ML IJ SOLN
4.0000 mg | Freq: Once | INTRAMUSCULAR | Status: AC
  Administered 2012-09-09: 4 mg via INTRAVENOUS
  Filled 2012-09-09: qty 1

## 2012-09-09 NOTE — ED Provider Notes (Signed)
Medical screening examination/treatment/procedure(s) were conducted as a shared visit with non-physician practitioner(s) and myself.  I personally evaluated the patient during the encounter  Quadriplegic with contractures of the upper extremities. Indwelling Foley with grossly cloudy urine draining. Housestaff change finally been obtained a fresh specimen for urinalysis. Patient states her symptoms are consistent with prior UTIs. She is also having chest pain which she states is similar to previous pneumonia.  Hanley Seamen, MD 09/09/12 [phone]

## 2012-09-09 NOTE — ED Notes (Signed)
Ptar called 

## 2012-09-09 NOTE — ED Notes (Signed)
Pt from [facility]. Pt having lower ab pain with burning during urination. Pt also reports chest pain. Hx of PNA. Mid grade fever.

## 2012-09-09 NOTE — ED Notes (Signed)
Report to [employer] at [facility]. Pt awaiting transport

## 2012-09-09 NOTE — ED Notes (Signed)
ptar is at bedside to transport pt

## 2012-09-09 NOTE — ED Provider Notes (Signed)
CSN: [id number]     Arrival date & time 09/09/12  9528 History     First MD Initiated Contact with Patient 09/09/12 [phone]     Chief Complaint  Patient presents with  . Abdominal Pain   (Consider location/radiation/quality/duration/timing/severity/associated sxs/prior Treatment) HPI Comments: Patient reports sharp chest and abdominal pain, not localized to one area, subjective fever, chills, SOB, cough productive of yellow sputum, nausea, burning with urination (pt has indwelling foley, but states it doesn't usually burn).  Denies change in bowel habits.  Pt is eating and drinking well.    Patient is a 47 y.o. female presenting with abdominal pain. The history is provided by the patient.  Abdominal Pain Associated symptoms include abdominal pain, chest pain, chills, coughing, a fever, nausea and vomiting.    Past Medical History  Diagnosis Date  . Respiratory failure   . Dysphagia   . Contracture of hand joint   . Anemia   . Neurogenic bladder   . Anxiety   . Schizophrenia   . Quadriplegia   . Hypertension   . CHF (congestive heart failure)   . Anginal pain   . Right leg DVT 1980's  . Pneumonia     "today; I get it alot" (07/21/2012)  . History of blood transfusion     "I've had a few" (07/21/2012)  . GERD (gastroesophageal reflux disease)   . Arthritis     "in my hands" (07/21/2012)  . Depression   . Kidney stones     "about 4; 2 different times" (6/92014)  . On home oxygen therapy     "38% q hs" (07/21/2012)   Past Surgical History  Procedure Laterality Date  . Tracheostomy  2011  . Gastrostomy w/ feeding tube    . Tubal ligation  1996  . Cystoscopy/retrograde/ureteroscopy/stone extraction with basket    . Lithotripsy    . Peg placement     History reviewed. No pertinent family history. History  Substance Use Topics  . Smoking status: Former Smoker -- 2.00 packs/day for 4 years    Types: Cigarettes    Quit date: 11/17/2006  . Smokeless tobacco: Never Used  .  Alcohol Use: No   OB History   Grav Para Term Preterm Abortions TAB SAB Ect Mult Living                 Review of Systems  Constitutional: Positive for fever and chills.  Respiratory: Positive for cough and shortness of breath.   Cardiovascular: Positive for chest pain.  Gastrointestinal: Positive for nausea, vomiting and abdominal pain. Negative for diarrhea and constipation.  Genitourinary: Positive for dysuria.    Allergies  Latex  Home Medications   Current Outpatient Rx  Name  Route  Sig  Dispense  Refill  . acetaminophen (TYLENOL) 500 MG tablet   Oral   Take 1,000 mg by mouth 3 (three) times daily.         Marland Kitchen albuterol (PROVENTIL) (2.5 MG/3ML) 0.083% nebulizer solution   Nebulization   Take 2.5 mg by nebulization every 6 (six) hours.         Marland Kitchen aspirin 325 MG tablet   Oral   Take 325 mg by mouth daily.         . bisacodyl (DULCOLAX) 10 MG suppository   Rectal   Place 10 mg rectally every other day as needed for constipation.         . calcium-vitamin D (OSCAL 500/200 D-3) 500-200 MG-UNIT per tablet  Oral   Take 1 tablet by mouth every morning.         . cloNIDine (CATAPRES) 0.2 MG tablet   Oral   Take 0.2 mg by mouth 2 (two) times daily.         Marland Kitchen docusate sodium (COLACE) 100 MG capsule   Oral   Take 100 mg by mouth 2 (two) times daily.         . ferrous sulfate 325 (65 FE) MG tablet   Oral   Take 325 mg by mouth daily with breakfast.         . fluticasone (FLONASE) 50 MCG/ACT nasal spray   Nasal   Place 1 spray into the nose daily.         . furosemide (LASIX) 20 MG tablet   Oral   Take 20 mg by mouth daily.         Marland Kitchen guaiFENesin (ROBITUSSIN) 100 MG/5ML SOLN   Oral   Take 15 mLs by mouth every 8 (eight) hours. scheduled         . hydroxypropyl methylcellulose (ISOPTO TEARS) 2.5 % ophthalmic solution   Both Eyes   Place 1 drop into both eyes 4 (four) times daily as needed (for dry eyes). Dry eyes         . ipratropium  (ATROVENT HFA) 17 MCG/ACT inhaler   Inhalation   Inhale 1 puff into the lungs every 6 (six) hours as needed for wheezing (and shortness of breath).         Marland Kitchen ipratropium (ATROVENT) 0.02 % nebulizer solution   Nebulization   Take 500 mcg by nebulization every 6 (six) hours.         . metoprolol tartrate (LOPRESSOR) 25 MG tablet   Oral   Take 12.5 mg by mouth 2 (two) times daily.         Marland Kitchen oxyCODONE (ROXICODONE) 15 MG immediate release tablet      Take one tablet by mouth daily after breakfast; Take one tablet by mouth every 4 hours as needed for pain   210 tablet   0   . pantoprazole sodium (PROTONIX) 40 mg/20 mL PACK   Oral   Take 40 mg by mouth daily.         . polyethylene glycol (MIRALAX / GLYCOLAX) packet   Oral   Take 17 g by mouth daily. constipation         . risperiDONE (RISPERDAL M-TABS) 1 MG disintegrating tablet   Oral   Take 1 mg by mouth 2 (two) times daily.         . sucralfate (CARAFATE) 1 G tablet   Oral   Take 1 tablet (1 g total) by mouth 4 (four) times daily -  with meals and at bedtime.         . temazepam (RESTORIL) 15 MG capsule   Oral   Take 15 mg by mouth at bedtime as needed for sleep.         Marland Kitchen venlafaxine (EFFEXOR) 100 MG tablet   Oral   Take 100 mg by mouth 2 (two) times daily.         . ziprasidone (GEODON) 80 MG capsule   Oral   Take 80 mg by mouth 2 (two) times daily with a meal.          BP 139/84  Pulse 98  Temp(Src) 98.5 F (36.9 C) (Oral)  Resp 20  SpO2 95% Physical Exam  Nursing note and  vitals reviewed. Constitutional: She appears well-developed and well-nourished. No distress.  HENT:  Head: Normocephalic and atraumatic.  Neck: Neck supple.  Tracheostomy tube in place  Cardiovascular: Normal rate and regular rhythm.   Pulmonary/Chest: Effort normal and breath sounds normal. No respiratory distress. She has no wheezes. She has no rales.  Abdominal: Soft. She exhibits no distension. There is no  tenderness. There is no rebound and no guarding.  g-tube in place.  Foley catheter in place  Musculoskeletal: She exhibits no edema and no tenderness.  Neurological: She is alert.  Skin: She is not diaphoretic.    ED Course   Procedures (including critical care time)  Labs Reviewed  CBC - Abnormal; Notable for the following:    Hemoglobin 10.2 (*)    HCT 33.1 (*)    MCV 74.4 (*)    MCH 22.9 (*)    All other components within normal limits  BASIC METABOLIC PANEL - Abnormal; Notable for the following:    Glucose, Bld 109 (*)    GFR calc non Af Amer 68 (*)    GFR calc Af Amer 78 (*)    All other components within normal limits  URINALYSIS, ROUTINE W REFLEX MICROSCOPIC - Abnormal; Notable for the following:    APPearance CLOUDY (*)    Hgb urine dipstick LARGE (*)    Protein, ur 30 (*)    Nitrite POSITIVE (*)    Leukocytes, UA LARGE (*)    All other components within normal limits  URINE MICROSCOPIC-ADD ON - Abnormal; Notable for the following:    Squamous Epithelial / LPF FEW (*)    Bacteria, UA MANY (*)    All other components within normal limits  HEPATIC FUNCTION PANEL - Abnormal; Notable for the following:    Albumin 3.2 (*)    Total Bilirubin 0.1 (*)    All other components within normal limits  URINE CULTURE  DIFFERENTIAL  LIPASE, BLOOD  CBC WITH DIFFERENTIAL   Dg Chest 1 View  09/09/2012   *RADIOLOGY REPORT*  Clinical Data: Midsternal chest pain.  Quadriplegia and CHF.  CHEST - 1 VIEW  Comparison: 07/21/2012.  Findings: Left subclavian portacatheter.  There is deflection where entering the thoracic inlet, without pinch off which was seen previously.  Tracheostomy in similar position.  Cardiomegaly.  Mediastinal contours distorted by rightward rotation.  Streaky bibasilar opacities, similar to prior studies.  No edema or pneumothorax. Pleural thickening at the apices.  IMPRESSION:  1.  Chronic lower lung opacities which is likely scarring or persistent/recurrent  atelectasis. Cannot exclude superimposed infection radiographically.  2.  Cardiomegaly without edema.   Original Report Authenticated By: Clarise Cruz Vitals:   09/09/12 0737  BP: 140/88  Pulse: 97  Temp:   Resp: 21      1. UTI (lower urinary tract infection)   2. Cough     MDM  Afebrile nontoxic pt with hx quadriplegia who lives in a nursing home p/w chest discomfort and productive cough also burning with urination (per patient, though with indwelling foley) found to have UTI.  Chest xray cannot rule out pneumonia, but no definite pneumonia.  Pt is not coughing on exam and lungs are CTAB, no tachypnea or increased work of breathing.  However, given xray, Discussed with Dr Read Drivers who recommends Levaquin to cover both.  Labs otherwise unremarkable and vitals signs stable.  Pt stable for discharge back to facility.   I doubt any other EMC precluding discharge at this time including,  but not necessarily limited to the following: pyelonephritis, ACS, PE  Trixie Dredge, PA-C 09/09/12 1259

## 2012-09-10 LAB — URINE CULTURE

## 2012-09-10 NOTE — Progress Notes (Signed)
Patient ID: [name], female   DOB: 08-24-1965, 47 y.o.   MRN: [id number]        PROGRESS NOTE  DATE: 08/18/2012  FACILITY:  [facility]  LEVEL OF CARE: SNF (31)  Acute Visit  CHIEF COMPLAINT:  Manage acute renal failure.    HISTORY OF PRESENT ILLNESS: I was requested by the staff to assess the patient regarding above problem(s):  On 08/08/2012:  BUN 30, creatinine 1.25.  On 08/04/2012:  BUN 32, creatinine 1.18.  Patient is currently on Lasix.  She has  chronic lower extremity swelling.  She does not report increasing lower extremity swelling or confusion.    PAST MEDICAL HISTORY : Reviewed.  No changes.  CURRENT MEDICATIONS: Reviewed per [facility]  REVIEW OF SYSTEMS:  GENERAL: no change in appetite, no fatigue, no weight changes, no fever, chills or weakness RESPIRATORY: no cough, SOB, DOE,, wheezing, hemoptysis CARDIAC: no chest pain or palpitations; chronic lower extremity swelling  GI: no abdominal pain, diarrhea, constipation, heart burn, nausea or vomiting  PHYSICAL EXAMINATION  GENERAL: no acute distress, morbidly obese body habitus NECK: patient has a trach RESPIRATORY: breathing is even & unlabored, BS CTAB CARDIAC: RRR, no murmur,no extra heart sounds EDEMA/VARICOSITIES:  +2 bilateral lower extremity edema  ARTERIAL:  pedal pulses nonpalpable   GI: abdomen soft, normal BS, no masses, no tenderness, no hepatomegaly, no splenomegaly PSYCHIATRIC: the patient is alert & oriented to person, affect & behavior appropriate  LABS/RADIOLOGY: 06/2012:  BUN 21, creatinine 0.89.    ASSESSMENT/PLAN:  Acute renal failure.  Worsening problem.  I am hesitant to discontinue Lasix due to her edema.  Therefore, we will reassess renal functions.    CPT CODE: 40981

## 2012-09-11 DIAGNOSIS — N179 Acute kidney failure, unspecified: Secondary | ICD-10-CM | POA: Insufficient documentation

## 2012-09-24 ENCOUNTER — Other Ambulatory Visit: Payer: Self-pay | Admitting: Geriatric Medicine

## 2012-09-24 MED ORDER — TEMAZEPAM 15 MG PO CAPS: 15.0000 mg | ORAL_CAPSULE | Freq: Every evening | ORAL | Status: DC | PRN

## 2012-10-04 ENCOUNTER — Emergency Department (HOSPITAL_COMMUNITY)
Admission: EM | Admit: 2012-10-04 | Discharge: 2012-10-04 | Disposition: A | Discharge status: Skilled Nursing Facility | Payer: Medicare Other | Attending: Emergency Medicine | Admitting: Emergency Medicine

## 2012-10-04 ENCOUNTER — Emergency Department (HOSPITAL_COMMUNITY): Payer: Medicare Other

## 2012-10-04 ENCOUNTER — Encounter (HOSPITAL_COMMUNITY): Payer: Self-pay | Admitting: Emergency Medicine

## 2012-10-04 DIAGNOSIS — Z87442 Personal history of urinary calculi: Secondary | ICD-10-CM | POA: Insufficient documentation

## 2012-10-04 DIAGNOSIS — Z79899 Other long term (current) drug therapy: Secondary | ICD-10-CM | POA: Insufficient documentation

## 2012-10-04 DIAGNOSIS — IMO0002 Reserved for concepts with insufficient information to code with codable children: Secondary | ICD-10-CM | POA: Insufficient documentation

## 2012-10-04 DIAGNOSIS — I10 Essential (primary) hypertension: Secondary | ICD-10-CM | POA: Insufficient documentation

## 2012-10-04 DIAGNOSIS — D649 Anemia, unspecified: Secondary | ICD-10-CM | POA: Insufficient documentation

## 2012-10-04 DIAGNOSIS — I509 Heart failure, unspecified: Secondary | ICD-10-CM | POA: Insufficient documentation

## 2012-10-04 DIAGNOSIS — Z7982 Long term (current) use of aspirin: Secondary | ICD-10-CM | POA: Insufficient documentation

## 2012-10-04 DIAGNOSIS — Z8679 Personal history of other diseases of the circulatory system: Secondary | ICD-10-CM | POA: Insufficient documentation

## 2012-10-04 DIAGNOSIS — Z87891 Personal history of nicotine dependence: Secondary | ICD-10-CM | POA: Insufficient documentation

## 2012-10-04 DIAGNOSIS — Z91040 Latex allergy status: Secondary | ICD-10-CM | POA: Insufficient documentation

## 2012-10-04 DIAGNOSIS — Y846 Urinary catheterization as the cause of abnormal reaction of the patient, or of later complication, without mention of misadventure at the time of the procedure: Secondary | ICD-10-CM | POA: Insufficient documentation

## 2012-10-04 DIAGNOSIS — F3289 Other specified depressive episodes: Secondary | ICD-10-CM | POA: Insufficient documentation

## 2012-10-04 DIAGNOSIS — Z86718 Personal history of other venous thrombosis and embolism: Secondary | ICD-10-CM | POA: Insufficient documentation

## 2012-10-04 DIAGNOSIS — Z8701 Personal history of pneumonia (recurrent): Secondary | ICD-10-CM | POA: Insufficient documentation

## 2012-10-04 DIAGNOSIS — F209 Schizophrenia, unspecified: Secondary | ICD-10-CM | POA: Insufficient documentation

## 2012-10-04 DIAGNOSIS — N390 Urinary tract infection, site not specified: Secondary | ICD-10-CM

## 2012-10-04 DIAGNOSIS — K219 Gastro-esophageal reflux disease without esophagitis: Secondary | ICD-10-CM | POA: Insufficient documentation

## 2012-10-04 DIAGNOSIS — M19049 Primary osteoarthritis, unspecified hand: Secondary | ICD-10-CM | POA: Insufficient documentation

## 2012-10-04 DIAGNOSIS — S3730XA Unspecified injury of urethra, initial encounter: Secondary | ICD-10-CM | POA: Insufficient documentation

## 2012-10-04 DIAGNOSIS — S3720XA Unspecified injury of bladder, initial encounter: Secondary | ICD-10-CM | POA: Insufficient documentation

## 2012-10-04 DIAGNOSIS — Z8709 Personal history of other diseases of the respiratory system: Secondary | ICD-10-CM | POA: Insufficient documentation

## 2012-10-04 DIAGNOSIS — T8389XA Other specified complication of genitourinary prosthetic devices, implants and grafts, initial encounter: Secondary | ICD-10-CM | POA: Insufficient documentation

## 2012-10-04 DIAGNOSIS — Z8669 Personal history of other diseases of the nervous system and sense organs: Secondary | ICD-10-CM | POA: Insufficient documentation

## 2012-10-04 DIAGNOSIS — F329 Major depressive disorder, single episode, unspecified: Secondary | ICD-10-CM | POA: Insufficient documentation

## 2012-10-04 LAB — URINALYSIS, ROUTINE W REFLEX MICROSCOPIC
Bilirubin Urine: NEGATIVE
Glucose, UA: NEGATIVE mg/dL
Specific Gravity, Urine: 1.014 (ref 1.005–1.030)
Urobilinogen, UA: 0.2 mg/dL (ref 0.0–1.0)

## 2012-10-04 LAB — CBC WITH DIFFERENTIAL/PLATELET
Basophils Relative: 0 % (ref 0–1)
Eosinophils Absolute: 0.5 10*3/uL (ref 0.0–0.7)
Eosinophils Relative: 5 % (ref 0–5)
HCT: 34.8 % — ABNORMAL LOW (ref 36.0–46.0)
Hemoglobin: 10.8 g/dL — ABNORMAL LOW (ref 12.0–15.0)
Lymphocytes Relative: 29 % (ref 12–46)
MCH: 23 pg — ABNORMAL LOW (ref 26.0–34.0)
MCHC: 31 g/dL (ref 30.0–36.0)
Monocytes Absolute: 0.8 10*3/uL (ref 0.1–1.0)
Neutro Abs: 5.4 10*3/uL (ref 1.7–7.7)

## 2012-10-04 LAB — URINE MICROSCOPIC-ADD ON

## 2012-10-04 MED ORDER — LIDOCAINE HCL (PF) 1 % IJ SOLN
INTRAMUSCULAR | Status: AC
  Administered 2012-10-04: 2 mL
  Filled 2012-10-04: qty 5

## 2012-10-04 MED ORDER — OXYCODONE-ACETAMINOPHEN 5-325 MG PO TABS
1.0000 | ORAL_TABLET | Freq: Once | ORAL | Status: AC
  Administered 2012-10-04: 1 via ORAL
  Filled 2012-10-04: qty 1

## 2012-10-04 MED ORDER — CIPROFLOXACIN HCL 500 MG PO TABS: 500.0000 mg | ORAL_TABLET | Freq: Two times a day (BID) | ORAL | Status: DC

## 2012-10-04 MED ORDER — CEFTRIAXONE SODIUM 1 G IJ SOLR
1.0000 g | Freq: Once | INTRAMUSCULAR | Status: AC
  Administered 2012-10-04: 1 g via INTRAMUSCULAR
  Filled 2012-10-04: qty 10

## 2012-10-04 NOTE — ED Provider Notes (Signed)
CSN: [id number]     Arrival date & time 10/04/12  0009 History     First MD Initiated Contact with Patient 10/04/12 0010     Chief Complaint  Patient presents with  . Vaginal Bleeding   (Consider location/radiation/quality/duration/timing/severity/associated sxs/prior Treatment) Patient is a 47 y.o. female presenting with vaginal bleeding. The history is provided by the EMS personnel. The history is limited by the condition of the patient.  Vaginal Bleeding Quality: clots in the perineum following follow insertion at her urologist office. Severity:  Mild Onset quality:  Gradual Timing:  Constant Progression:  Unchanged Chronicity:  New Menstrual history:  Postmenopausal Context: not after intercourse   Relieved by:  Nothing Worsened by:  Nothing tried Ineffective treatments:  None tried Associated symptoms: no abdominal pain and no fever   Risk factors: no bleeding disorder     Past Medical History  Diagnosis Date  . Respiratory failure   . Dysphagia   . Contracture of hand joint   . Anemia   . Neurogenic bladder   . Anxiety   . Schizophrenia   . Quadriplegia   . Hypertension   . CHF (congestive heart failure)   . Anginal pain   . Right leg DVT 1980's  . Pneumonia     "today; I get it alot" (07/21/2012)  . History of blood transfusion     "I've had a few" (07/21/2012)  . GERD (gastroesophageal reflux disease)   . Arthritis     "in my hands" (07/21/2012)  . Depression   . Kidney stones     "about 4; 2 different times" (6/92014)  . On home oxygen therapy     "38% q hs" (07/21/2012)   Past Surgical History  Procedure Laterality Date  . Tracheostomy  2011  . Gastrostomy w/ feeding tube    . Tubal ligation  1996  . Cystoscopy/retrograde/ureteroscopy/stone extraction with basket    . Lithotripsy    . Peg placement     No family history on file. History  Substance Use Topics  . Smoking status: Former Smoker -- 2.00 packs/day for 4 years    Types: Cigarettes   Quit date: 11/17/2006  . Smokeless tobacco: Never Used  . Alcohol Use: No   OB History   Grav Para Term Preterm Abortions TAB SAB Ect Mult Living                 Review of Systems  Constitutional: Negative for fever.  Gastrointestinal: Negative for abdominal pain.  Genitourinary: Positive for vaginal bleeding.  All other systems reviewed and are negative.    Allergies  Latex  Home Medications   Current Outpatient Rx  Name  Route  Sig  Dispense  Refill  . acetaminophen (TYLENOL) 500 MG tablet   Oral   Take 1,000 mg by mouth 3 (three) times daily.         Marland Kitchen albuterol (PROVENTIL) (2.5 MG/3ML) 0.083% nebulizer solution   Nebulization   Take 2.5 mg by nebulization every 6 (six) hours.         Marland Kitchen aspirin 325 MG tablet   Oral   Take 325 mg by mouth daily.         . bisacodyl (DULCOLAX) 10 MG suppository   Rectal   Place 10 mg rectally every other day as needed for constipation.         . calcium-vitamin D (OSCAL 500/200 D-3) 500-200 MG-UNIT per tablet   Oral   Take 1 tablet by mouth  every morning.         . cloNIDine (CATAPRES) 0.2 MG tablet   Oral   Take 0.2 mg by mouth 2 (two) times daily.         Marland Kitchen docusate sodium (COLACE) 100 MG capsule   Oral   Take 100 mg by mouth 2 (two) times daily.         . ferrous sulfate 325 (65 FE) MG tablet   Oral   Take 325 mg by mouth daily with breakfast.         . fluticasone (FLONASE) 50 MCG/ACT nasal spray   Nasal   Place 1 spray into the nose daily.         . furosemide (LASIX) 20 MG tablet   Oral   Take 20 mg by mouth daily.         Marland Kitchen guaiFENesin (ROBITUSSIN) 100 MG/5ML SOLN   Oral   Take 15 mLs by mouth every 8 (eight) hours. scheduled         . hydroxypropyl methylcellulose (ISOPTO TEARS) 2.5 % ophthalmic solution   Both Eyes   Place 1 drop into both eyes 4 (four) times daily as needed (for dry eyes). Dry eyes         . ipratropium (ATROVENT HFA) 17 MCG/ACT inhaler   Inhalation   Inhale  1 puff into the lungs every 6 (six) hours as needed for wheezing (and shortness of breath).         Marland Kitchen ipratropium (ATROVENT) 0.02 % nebulizer solution   Nebulization   Take 500 mcg by nebulization every 6 (six) hours.         . metoprolol tartrate (LOPRESSOR) 25 MG tablet   Oral   Take 12.5 mg by mouth 2 (two) times daily.         Marland Kitchen oxyCODONE (ROXICODONE) 15 MG immediate release tablet   Oral   Take 15 mg by mouth daily after breakfast.         . oxyCODONE (ROXICODONE) 15 MG immediate release tablet   Oral   Take 15 mg by mouth every 4 (four) hours as needed for pain.         . pantoprazole sodium (PROTONIX) 40 mg/20 mL PACK   Oral   Take 40 mg by mouth daily.         . polyethylene glycol (MIRALAX / GLYCOLAX) packet   Oral   Take 17 g by mouth daily. constipation         . risperiDONE (RISPERDAL M-TABS) 1 MG disintegrating tablet   Oral   Take 1 mg by mouth 2 (two) times daily.         . sucralfate (CARAFATE) 1 G tablet   Oral   Take 1 tablet (1 g total) by mouth 4 (four) times daily -  with meals and at bedtime.         . temazepam (RESTORIL) 15 MG capsule   Oral   Take 1 capsule (15 mg total) by mouth at bedtime as needed for sleep.   30 capsule   5   . venlafaxine (EFFEXOR) 100 MG tablet   Oral   Take 100 mg by mouth 2 (two) times daily.         . ziprasidone (GEODON) 80 MG capsule   Oral   Take 80 mg by mouth 2 (two) times daily with a meal.         . ciprofloxacin (CIPRO) 500 MG tablet  Oral   Take 1 tablet (500 mg total) by mouth 2 (two) times daily. One po bid x 7 days   14 tablet   0    BP 165/98  Pulse 100  Temp(Src) 97.9 F (36.6 C) (Oral)  Resp 20  SpO2 100% Physical Exam  Constitutional: She appears well-developed and well-nourished. No distress.  HENT:  Head: Normocephalic and atraumatic.  Mouth/Throat: Oropharynx is clear and moist. No oropharyngeal exudate.  Eyes: Conjunctivae are normal. Pupils are equal, round,  and reactive to light.  Neck: Normal range of motion. Neck supple.  Cardiovascular: Normal rate, regular rhythm and intact distal pulses.   Pulmonary/Chest: Effort normal and breath sounds normal. She has no wheezes. She has no rales.  Abdominal: Soft. Bowel sounds are normal. There is no tenderness. There is no rebound and no guarding.  Genitourinary:  Clots at the urethra.  Removed no blood in the vaginal introitus.  No fistula chaperones present  Musculoskeletal: She exhibits no edema.  Neurological: She is alert.  Skin: Skin is warm and dry. She is not diaphoretic.  Psychiatric: She has a normal mood and affect.    ED Course   Procedures (including critical care time)  Labs Reviewed  CBC WITH DIFFERENTIAL - Abnormal; Notable for the following:    Hemoglobin 10.8 (*)    HCT 34.8 (*)    MCV 74.0 (*)    MCH 23.0 (*)    All other components within normal limits  URINALYSIS, ROUTINE W REFLEX MICROSCOPIC - Abnormal; Notable for the following:    APPearance TURBID (*)    Hgb urine dipstick LARGE (*)    Protein, ur 100 (*)    Nitrite POSITIVE (*)    Leukocytes, UA LARGE (*)    All other components within normal limits  URINE MICROSCOPIC-ADD ON - Abnormal; Notable for the following:    Squamous Epithelial / LPF FEW (*)    Bacteria, UA MANY (*)    All other components within normal limits   US Transvaginal Non-ob  10/04/2012   *RADIOLOGY REPORT*  Clinical Data: Vaginal bleeding today.  Patient is quadriplegic. Foley catheter is in place.  TRANSABDOMINAL AND TRANSVAGINAL ULTRASOUND OF PELVIS Technique:  Both transabdominal and transvaginal ultrasound examinations of the pelvis were performed. Transabdominal technique was performed for global imaging of the pelvis including uterus, ovaries, adnexal regions, and pelvic cul-de-sac.  It was necessary to proceed with endovaginal exam following the transabdominal exam to visualize the endometrium and ovaries.  Comparison:  CT abdomen and  pelvis 10/03/2012  Findings:  Uterus: The uterus is only visualized transabdominally. Transvaginal images are limited due to Foley catheter in the patient's positioning.  The uterus appears anteverted and measures 6.9 x 3.1 x 4.3 cm.  No definite myometrial mass lesions are demonstrated.  Endometrium: Limited visualization of the endometrium.  Endometrial stripe thickness is normal at 5.3 mm.  Right ovary:  Right ovary is not visualized.  Left ovary: Left ovary is not visualized.  Other findings: No free fluid  IMPRESSION: Uterus and endometrium appear grossly normal on transabdominal imaging.  Uterus is not visualized transvaginally.  The ovaries are not visualized.   Original Report Authenticated By: Burman Nieves, M.D.   US Pelvis Complete  10/04/2012   *RADIOLOGY REPORT*  Clinical Data: Vaginal bleeding today.  Patient is quadriplegic. Foley catheter is in place.  TRANSABDOMINAL AND TRANSVAGINAL ULTRASOUND OF PELVIS Technique:  Both transabdominal and transvaginal ultrasound examinations of the pelvis were performed. Transabdominal technique was performed for  global imaging of the pelvis including uterus, ovaries, adnexal regions, and pelvic cul-de-sac.  It was necessary to proceed with endovaginal exam following the transabdominal exam to visualize the endometrium and ovaries.  Comparison:  CT abdomen and pelvis 10/03/2012  Findings:  Uterus: The uterus is only visualized transabdominally. Transvaginal images are limited due to Foley catheter in the patient's positioning.  The uterus appears anteverted and measures 6.9 x 3.1 x 4.3 cm.  No definite myometrial mass lesions are demonstrated.  Endometrium: Limited visualization of the endometrium.  Endometrial stripe thickness is normal at 5.3 mm.  Right ovary:  Right ovary is not visualized.  Left ovary: Left ovary is not visualized.  Other findings: No free fluid  IMPRESSION: Uterus and endometrium appear grossly normal on transabdominal imaging.  Uterus is  not visualized transvaginally.  The ovaries are not visualized.   Original Report Authenticated By: Burman Nieves, M.D.   1. Urethral trauma, initial encounter   2. UTI (lower urinary tract infection)     MDM  Bleeding is clearly coming from the foley catheter there is no blood in the vaginal introitus.  This was most certainly caused by traumatic foley insertion.  Follow up with urology on Monday.  Will treat for UTI  Seraphim Trow K Raha Tennison-Rasch, MD 10/04/12 [phone]

## 2012-10-04 NOTE — ED Notes (Signed)
PTAR here to Escort Pt. Back to facility. Updated them on Pt.'s plan of care.

## 2012-10-04 NOTE — ED Notes (Signed)
Blood obtained from L chest port-a-cath.

## 2012-10-04 NOTE — ED Notes (Signed)
[facility] called to [facility] skilled Facility due to Pt. Having episode of vaginal bleeding that started around 1630 yesterday.

## 2012-10-05 LAB — URINE CULTURE

## 2012-10-09 ENCOUNTER — Other Ambulatory Visit: Payer: Self-pay | Admitting: *Deleted

## 2012-10-09 MED ORDER — OXYCODONE HCL 15 MG PO TABS: ORAL_TABLET | ORAL | Status: DC

## 2012-11-06 ENCOUNTER — Non-Acute Institutional Stay (SKILLED_NURSING_FACILITY): Payer: Medicare Other | Admitting: Internal Medicine

## 2012-11-06 DIAGNOSIS — I10 Essential (primary) hypertension: Secondary | ICD-10-CM

## 2012-11-06 DIAGNOSIS — E781 Pure hyperglyceridemia: Secondary | ICD-10-CM

## 2012-11-06 DIAGNOSIS — D509 Iron deficiency anemia, unspecified: Secondary | ICD-10-CM

## 2012-11-06 DIAGNOSIS — J9610 Chronic respiratory failure, unspecified whether with hypoxia or hypercapnia: Secondary | ICD-10-CM

## 2012-11-07 NOTE — Progress Notes (Signed)
Patient ID: [name], female   DOB: April 22, 1965, 47 y.o.   MRN: [id number]        PROGRESS NOTE  DATE:  11/06/2012  FACILITY: [facility]   LEVEL OF CARE: SNF  Routine Visit  CHIEF COMPLAINT:  Manage iron deficiency anemia, hypertriglyceridemia and hypertension.   HISTORY OF PRESENT ILLNESS:    REASSESSMENT OF ONGOING PROBLEM(S):  HYPERTRIGLYCERIDEMIA:   In 03/2012:  Triglycerides 258, otherwise fasting lipid panel normal. In 7-14 triglycerides 270, HDL 30, total cholesterol 562, LDL 79.  No complications reported from the medications presently being used.    ANEMIA:  On 05/16/2012:  Patient's hemoglobin was 10.1, MCV 73.  On 04/28/2012:  Hemoglobin was 10.4, and 6/14 hemoglobin 9.3.  The patient denies fatigue, melena or hematochezia. No complications from the medications currently being used.   HTN: Pt 's HTN remains stable.  Denies CP, sob, DOE, headaches, dizziness or visual disturbances.  No complications from the medications currently being used.  Last BP : 150/80,  120/62, 114/62, 140/80, 158/90.  PAST MEDICAL HISTORY : Reviewed.  No changes.  CURRENT MEDICATIONS: Reviewed per [facility]  REVIEW OF SYSTEMS:  Difficult to obtain.  Patient is a poor historian today.    PHYSICAL EXAMINATION  VS:  T 98.4     P94     RR 20   BP  158/90   POX %     WT (Lb) 222  GENERAL: no acute distress, morbidly obese body habitus EYES: conjunctivae normal, sclerae normal, normal eye lids NECK: patient has a trach LYMPHATICS: no LAN in the neck, no supraclavicular LAN RESPIRATORY: breathing is even & unlabored, BS CTAB CARDIAC: RRR, no murmur,no extra heart sounds EDEMA/VARICOSITIES:  +2 bilateral lower extremity edema  ARTERIAL:  pedal pulses nonpalpable  GI: abdomen soft, normal BS, no masses, no tenderness, no hepatomegaly, no splenomegaly PSYCHIATRIC: the patient is alert & oriented to person, affect & behavior appropriate  LABS/RADIOLOGY:  9-14 hemoglobin A1c 6.1 7-14 BUN 19,  creatinine 1.02 6/14 BUN 30, creatinine 1.25, hemoglobin 9.3, MCV 75 otherwise CBC normal, hemoglobin A1c 6 In 05/2012:  Hemoglobin 10.1, MCV 73, otherwise CBC normal.    CMP normal.  In 04/2012:  Hemoglobin A1C  5.7.    ASSESSMENT/PLAN:   Hypertriglyceridemia.  Stable. Continue current medications.    Iron deficiency anemia.  Hemoglobin declined.  We will monitor.    Hypertension.  Uncontrolled. Increase Lopressor to 25 mg twice a day  Chronic respiratory failure.  Continue trach support.   Quadriplegia.  Continue supportive care.   Schizophrenia.  Stable.  GERD.  Continue Protonix.   Constipation.  Continue current laxatives.    Chronic pain.  Currently on routine Ultram.    CPT CODE: 13086

## 2012-11-18 ENCOUNTER — Non-Acute Institutional Stay (SKILLED_NURSING_FACILITY): Payer: Medicare Other | Admitting: Internal Medicine

## 2012-11-18 DIAGNOSIS — N390 Urinary tract infection, site not specified: Secondary | ICD-10-CM

## 2012-11-18 NOTE — Progress Notes (Signed)
Patient ID: [name], female   DOB: March 25, 1965, 47 y.o.   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 Nevertheless she has surprisingly intact light touch sensation. This has led to a number of complaints below below for liver injury which are really difficult to interpret and may actually have a psychiatric background which she also has a. She also has a history of polynephritis with nephrolithiasis appear she has a chronic indwelling Foley cathete. She tells me today that she has pain in in her groin area as well as discharge.  Physical examination Gen. she is not in any distress she is afebrile Cardiac heart sounds are normal Abdomen somewhat distended however bowel sounds are positive there is no liver or spleen GU marked suprapubic tenderness without distention her urine is cloudy there is no overt CVA tenderness  Impression/plan #1 question catheter associated cystitis. His really difficult to refute this appeared to get a urine culture on her start her empirically on Rocephin. Follow her later this week

## 2012-11-24 ENCOUNTER — Non-Acute Institutional Stay (SKILLED_NURSING_FACILITY): Payer: Medicare Other | Admitting: Internal Medicine

## 2012-11-24 DIAGNOSIS — D509 Iron deficiency anemia, unspecified: Secondary | ICD-10-CM

## 2012-11-24 DIAGNOSIS — I10 Essential (primary) hypertension: Secondary | ICD-10-CM

## 2012-11-24 DIAGNOSIS — E781 Pure hyperglyceridemia: Secondary | ICD-10-CM

## 2012-11-24 DIAGNOSIS — J9610 Chronic respiratory failure, unspecified whether with hypoxia or hypercapnia: Secondary | ICD-10-CM

## 2012-11-26 NOTE — Progress Notes (Signed)
Patient ID: [name], female   DOB: 08-22-1965, 47 y.o.   MRN: [id number]        PROGRESS NOTE  DATE:  11/24/2012  FACILITY: [facility]   LEVEL OF CARE: SNF  Routine Visit  CHIEF COMPLAINT:  Manage iron deficiency anemia, hypertriglyceridemia and hypertension.   HISTORY OF PRESENT ILLNESS:    REASSESSMENT OF ONGOING PROBLEM(S):  HYPERTRIGLYCERIDEMIA:   In 03/2012:  Triglycerides 258, otherwise fasting lipid panel normal. In 7-14 triglycerides 270, HDL 30, total cholesterol 782, LDL 79.  No complications reported from the medications presently being used.    ANEMIA:  On 05/16/2012:  Patient's hemoglobin was 10.1, MCV 73.  On 04/28/2012:  Hemoglobin was 10.4, and 6/14 hemoglobin 9.3, in 10/14 Hb 10.9, mcv 74. The patient denies fatigue, melena or hematochezia. No complications from the medications currently being used.   HTN: Pt 's HTN remains stable.  Denies CP, sob, DOE, headaches, dizziness or visual disturbances.  No complications from the medications currently being used.  Last BP : 150/80,  120/62, 114/62, 140/80, 158/90, 122/64.  PAST MEDICAL HISTORY : Reviewed.  No changes.  CURRENT MEDICATIONS: Reviewed per [facility]  REVIEW OF SYSTEMS:  Difficult to obtain.  Patient is a poor historian today.    PHYSICAL EXAMINATION  VS:  T 97.6     P 72     RR 20   BP  122/64   POX %     WT (Lb) 227  GENERAL: no acute distress, morbidly obese body habitus EYES: conjunctivae normal, sclerae normal, normal eye lids NECK: patient has a trach LYMPHATICS: difficult to assess due to trach RESPIRATORY: breathing is even & unlabored, BS CTAB CARDIAC: RRR, no murmur,no extra heart sounds EDEMA/VARICOSITIES:  +2 bilateral lower extremity edema  ARTERIAL:  pedal pulses nonpalpable  GI: abdomen soft, normal BS, no masses, no tenderness, no hepatomegaly, no splenomegaly PSYCHIATRIC: the patient is alert & oriented to person, affect & behavior appropriate  LABS/RADIOLOGY:  10/14 Hb 10.9, mcv  74 ow cbc nl, glc 119 ow cmp nl  9-14 hemoglobin A1c 6.1 7-14 BUN 19, creatinine 1.02 6/14 BUN 30, creatinine 1.25, hemoglobin 9.3, MCV 75 otherwise CBC normal, hemoglobin A1c 6 In 05/2012:  Hemoglobin 10.1, MCV 73, otherwise CBC normal.    CMP normal.  In 04/2012:  Hemoglobin A1C  5.7.    ASSESSMENT/PLAN:   Hypertriglyceridemia.  Stable. Continue current medications.    Iron deficiency anemia.  Hemoglobin improved.  We will monitor.    Hypertension. Improved.  Chronic respiratory failure.  Continue trach support.   Quadriplegia.  Continue supportive care.   Schizophrenia.  Stable.  GERD.  Continue Protonix.   Constipation.  Continue current laxatives.    Chronic pain.  Currently on routine Ultram.    CPT CODE: 95621

## 2012-12-03 IMAGING — CR DG CHEST 1V PORT
1 series · 1 of 1 positions shown · non-contrast
Comparison: 11/21/2011 and earlier.

CLINICAL DATA: 46-year-old female respiratory failure.

PORTABLE CHEST - 1 VIEW

[AP]
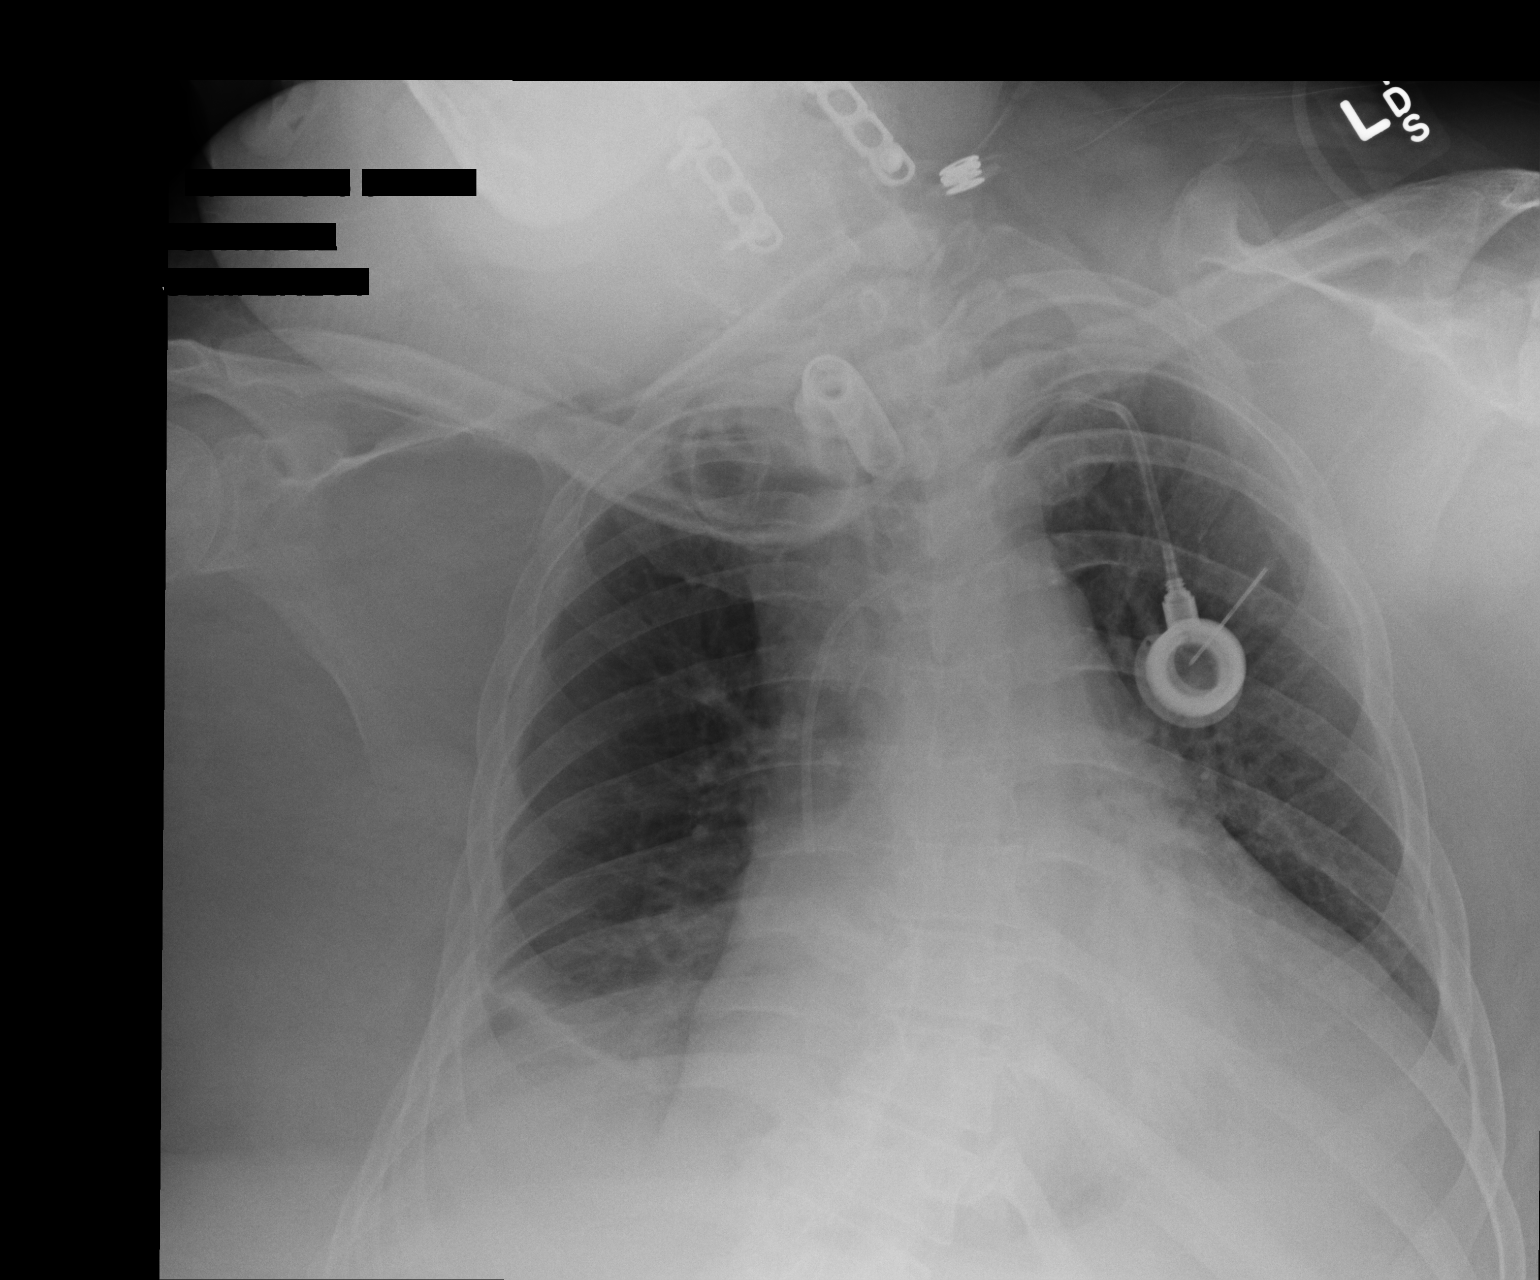

[1 of 1 positions shown; findings below may reference images not displayed]

FINDINGS: Portable semi upright AP view 3222 hours.  Stable
rotation of the right.  Stable tracheostomy tube and left chest
Port-A-Cath. Stable cardiomegaly and mediastinal contours.  Upper
lobes remain clear.  No pneumothorax or edema.  Streaky bibasilar
opacity is mildly improved, but remained greater than that on
11/16/2011.
IMPRESSION: Interval mildly improved bibasilar ventilation.  Residual streaky
opacity still greater than that on 11/16/2011.

## 2012-12-13 IMAGING — CR DG ABD PORTABLE 1V
2 series · 2 of 2 positions shown · non-contrast
Comparison: 08/24/2011

CLINICAL DATA: Constipation.  Abdominal pain.  History of
gastrostomy, quadriplegia.

PORTABLE ABDOMEN - 1 VIEW

[AP (1 of 2)]
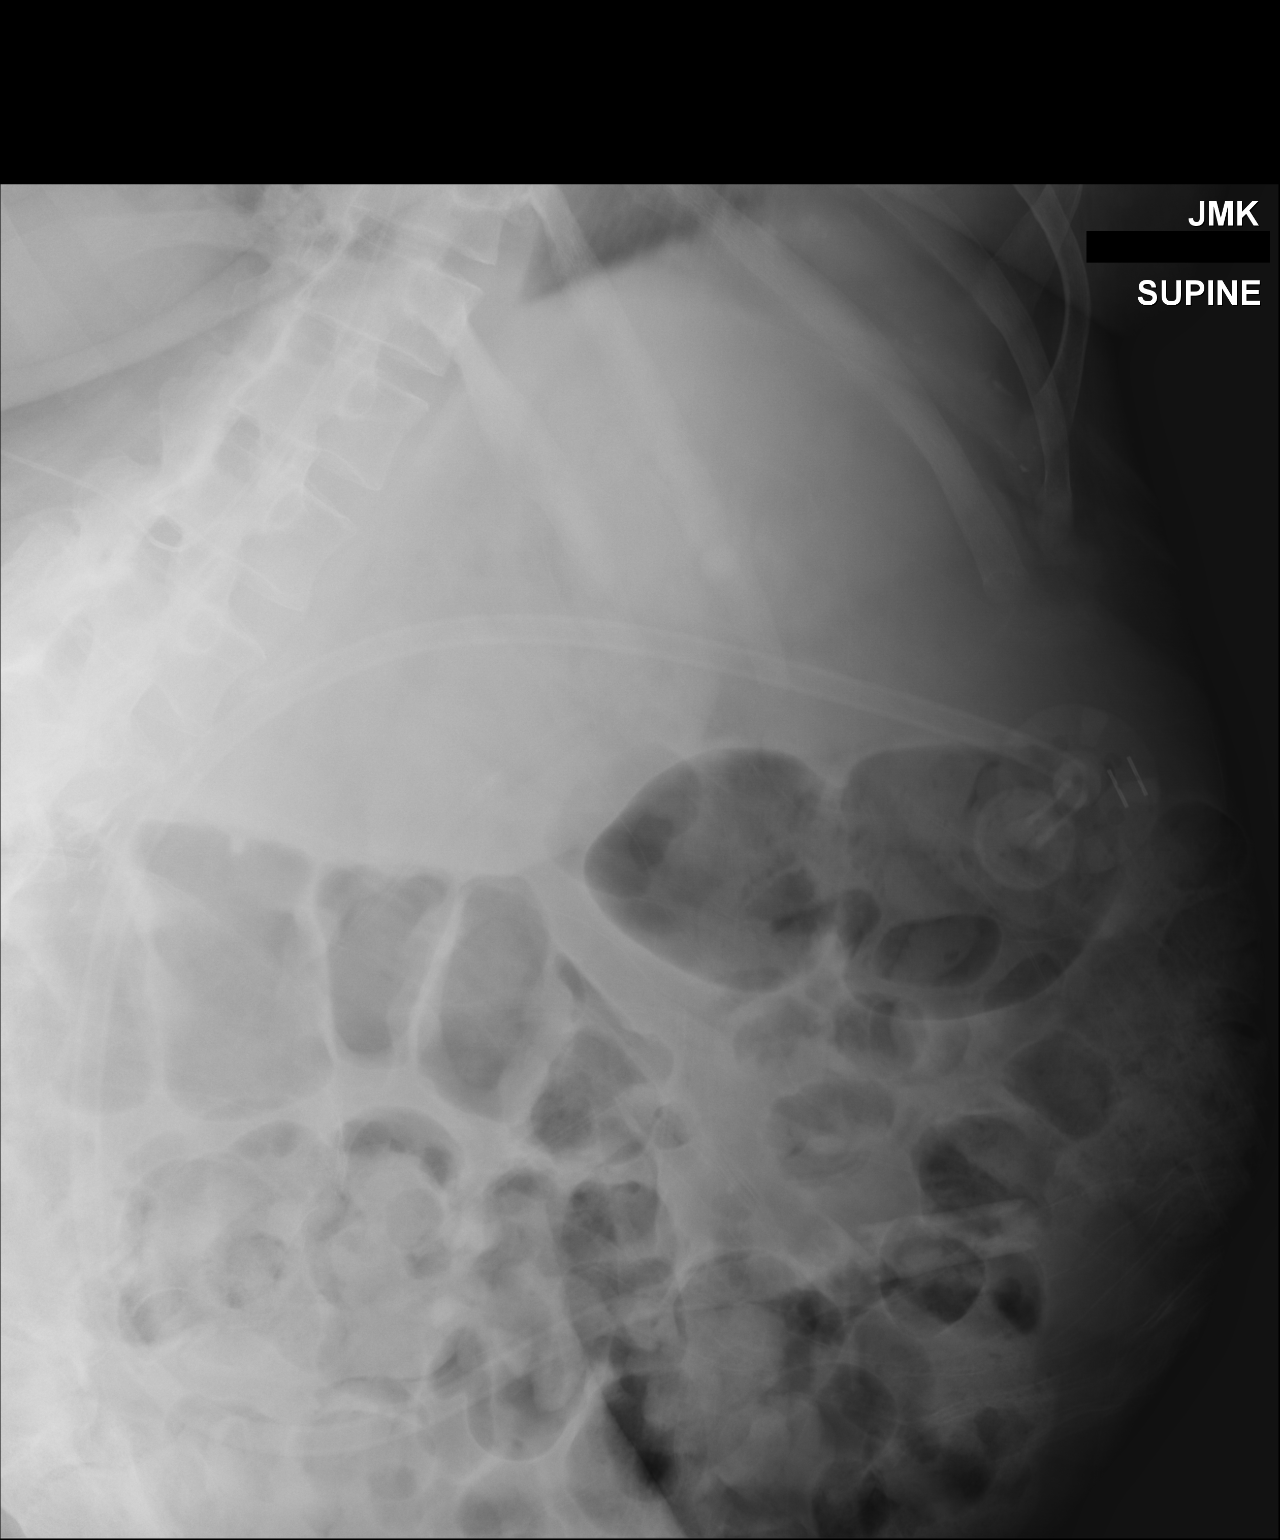

[AP (2 of 2)]
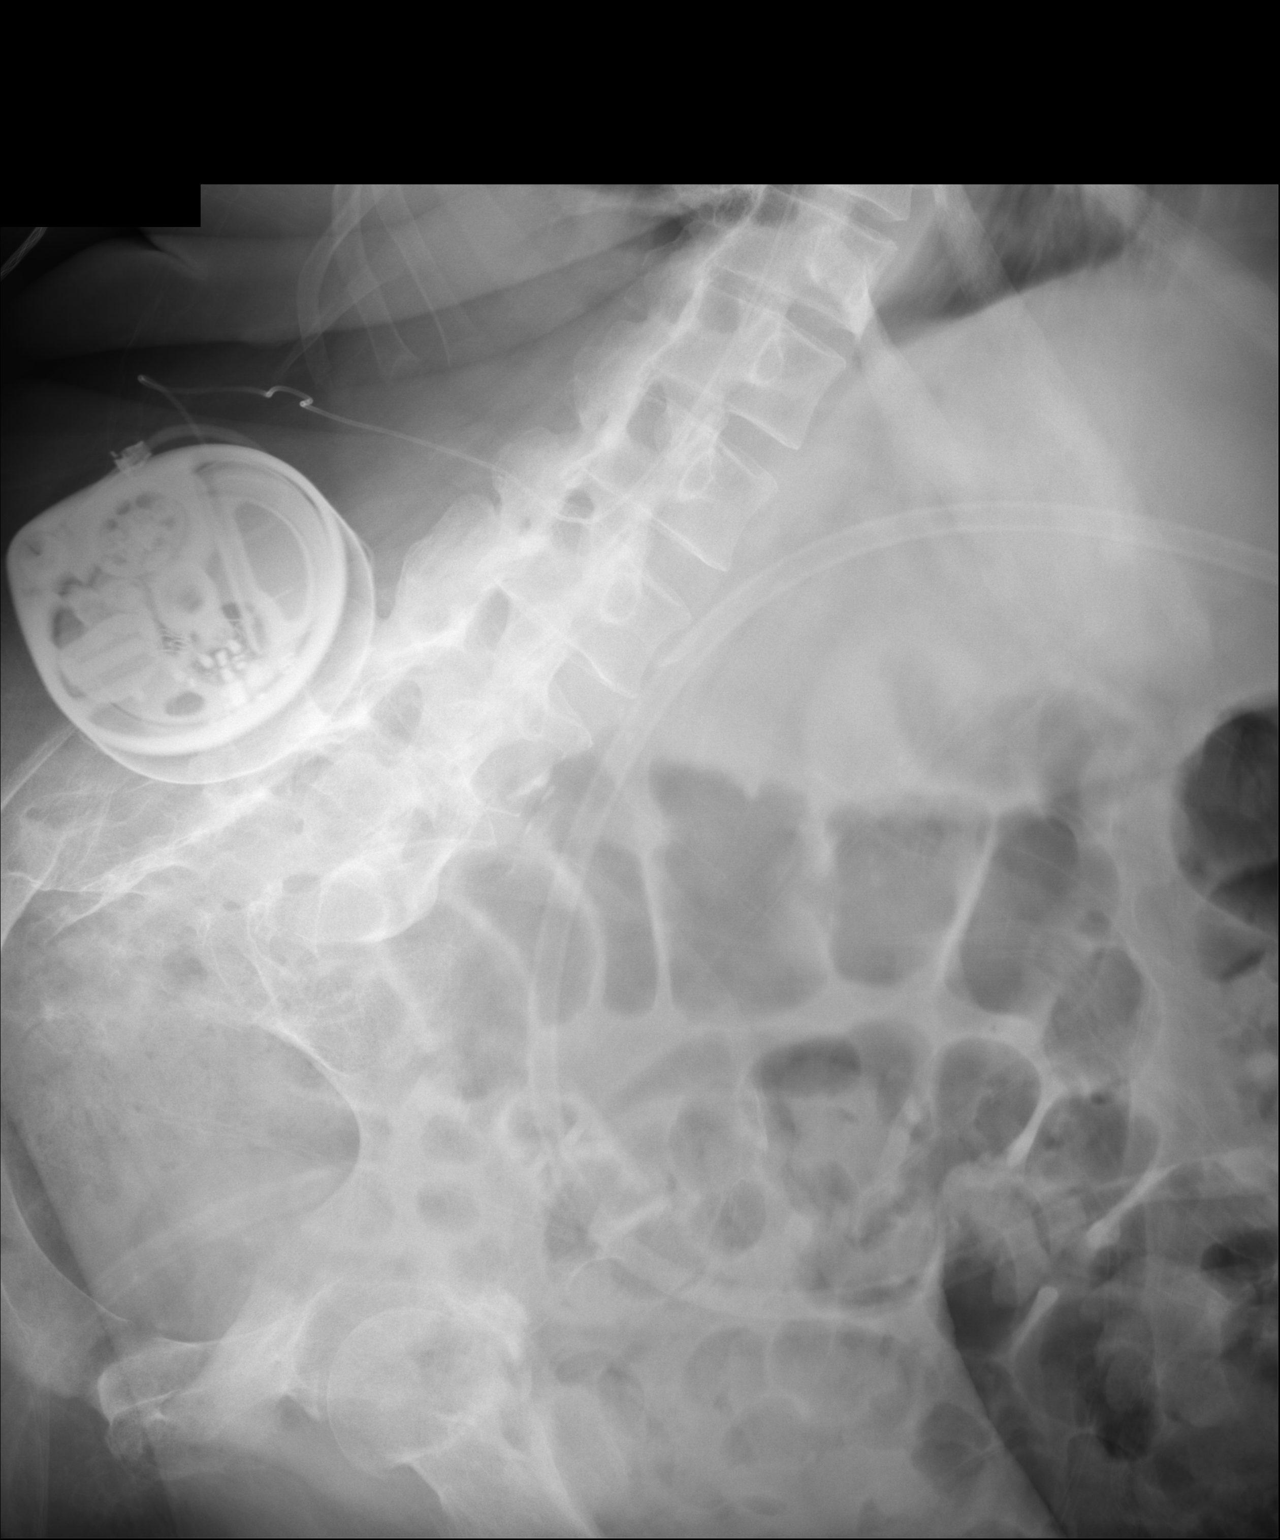

[2 of 2 positions shown; findings below may reference images not displayed]

FINDINGS: Left upper quadrant gastrostomy is visible.  The patient
has a spinal stimulator in place.  A visible bowel gas pattern is
nonobstructive.  There are degenerative changes in the hips.
Nonstandard positioning.
IMPRESSION: 1.  No evidence for bowel obstruction.
2.  Gastrostomy tube in the left upper quadrant.

## 2013-01-06 ENCOUNTER — Non-Acute Institutional Stay (SKILLED_NURSING_FACILITY): Payer: Medicare Other | Admitting: Internal Medicine

## 2013-01-06 ENCOUNTER — Encounter: Payer: Self-pay | Admitting: Internal Medicine

## 2013-01-06 DIAGNOSIS — E781 Pure hyperglyceridemia: Secondary | ICD-10-CM

## 2013-01-06 DIAGNOSIS — I10 Essential (primary) hypertension: Secondary | ICD-10-CM

## 2013-01-06 DIAGNOSIS — D509 Iron deficiency anemia, unspecified: Secondary | ICD-10-CM

## 2013-01-06 DIAGNOSIS — J9610 Chronic respiratory failure, unspecified whether with hypoxia or hypercapnia: Secondary | ICD-10-CM

## 2013-01-06 NOTE — Progress Notes (Signed)
Patient ID: [name], female   DOB: Oct 10, 1965, 47 y.o.   MRN: [id number]        PROGRESS NOTE  DATE:  01/06/2013  FACILITY: [facility]   LEVEL OF CARE: SNF  Routine Visit  CHIEF COMPLAINT:  Manage iron deficiency anemia, hypertriglyceridemia and hypertension.   HISTORY OF PRESENT ILLNESS:    REASSESSMENT OF ONGOING PROBLEM(S):  HYPERTRIGLYCERIDEMIA:   In 03/2012:  Triglycerides 258, otherwise fasting lipid panel normal. In 7-14 triglycerides 270, HDL 30, total cholesterol 409, LDL 79.  No complications reported from the medications presently being used.    ANEMIA:  On 05/16/2012:  Patient's hemoglobin was 10.1, MCV 73.  On 04/28/2012:  Hemoglobin was 10.4, and 6/14 hemoglobin 9.3, in 10/14 Hb 10.9, mcv 74. The patient denies fatigue, melena or hematochezia. No complications from the medications currently being used.   HTN: Pt 's HTN remains stable.  Denies CP, sob, DOE, headaches, dizziness or visual disturbances.  No complications from the medications currently being used.  Last BP : 150/80,  120/62, 114/62, 140/80, 158/90, 122/64, 140/65.  PAST MEDICAL HISTORY : Reviewed.  No changes.  CURRENT MEDICATIONS: Reviewed per [facility]  REVIEW OF SYSTEMS:  Difficult to obtain.  Patient is a poor historian today.    PHYSICAL EXAMINATION  VS:  T 98.2     P 65     RR 17   BP  140/65   POX %     WT (Lb) 227  GENERAL: no acute distress, morbidly obese body habitus EYES: conjunctivae normal, sclerae normal, normal eye lids NECK: patient has a trach LYMPHATICS: difficult to assess due to trach RESPIRATORY: breathing is even & unlabored, BS CTAB CARDIAC: RRR, no murmur,no extra heart sounds EDEMA/VARICOSITIES:  +2 bilateral lower extremity edema  ARTERIAL:  pedal pulses nonpalpable  GI: abdomen soft, normal BS, no masses, no tenderness, no hepatomegaly, no splenomegaly PSYCHIATRIC: the patient is alert & oriented to person, affect & behavior appropriate  LABS/RADIOLOGY:  10/14 Hb  10.9, mcv 74 ow cbc nl, glc 119 ow cmp nl  9-14 hemoglobin A1c 6.1 7-14 BUN 19, creatinine 1.02 6/14 BUN 30, creatinine 1.25, hemoglobin 9.3, MCV 75 otherwise CBC normal, hemoglobin A1c 6 In 05/2012:  Hemoglobin 10.1, MCV 73, otherwise CBC normal.    CMP normal.  In 04/2012:  Hemoglobin A1C  5.7.    ASSESSMENT/PLAN:   Hypertriglyceridemia.  Stable. Continue current medications.    Iron deficiency anemia.  Hemoglobin improved.  We will monitor.    Hypertension. Blood pressure borderline  Chronic respiratory failure.  Continue trach support.   Quadriplegia.  Continue supportive care.   Schizophrenia.  Stable.  GERD.  Continue Protonix.   Constipation.  Continue current laxatives.    Chronic pain.  Currently on routine Ultram.    CPT CODE: 81191

## 2013-01-07 ENCOUNTER — Other Ambulatory Visit: Payer: Self-pay

## 2013-01-07 MED ORDER — OXYCODONE HCL 15 MG PO TABS: ORAL_TABLET | ORAL | Status: DC

## 2013-01-21 ENCOUNTER — Other Ambulatory Visit: Payer: Self-pay | Admitting: *Deleted

## 2013-01-21 MED ORDER — TEMAZEPAM 15 MG PO CAPS: ORAL_CAPSULE | ORAL | Status: DC

## 2013-01-27 ENCOUNTER — Non-Acute Institutional Stay (SKILLED_NURSING_FACILITY): Payer: Medicare Other | Admitting: Internal Medicine

## 2013-01-27 ENCOUNTER — Encounter: Payer: Self-pay | Admitting: Internal Medicine

## 2013-01-27 DIAGNOSIS — J9610 Chronic respiratory failure, unspecified whether with hypoxia or hypercapnia: Secondary | ICD-10-CM

## 2013-01-27 DIAGNOSIS — I10 Essential (primary) hypertension: Secondary | ICD-10-CM

## 2013-01-27 DIAGNOSIS — D509 Iron deficiency anemia, unspecified: Secondary | ICD-10-CM

## 2013-01-27 DIAGNOSIS — E781 Pure hyperglyceridemia: Secondary | ICD-10-CM

## 2013-01-27 NOTE — Progress Notes (Signed)
Patient ID: [name], female   DOB: 03-20-1965, 47 y.o.   MRN: [id number]        PROGRESS NOTE  DATE:  01/27/2013  FACILITY: [facility]   LEVEL OF CARE: SNF  Routine Visit  CHIEF COMPLAINT:  Manage iron deficiency anemia, hypertriglyceridemia and hypertension.   HISTORY OF PRESENT ILLNESS:    REASSESSMENT OF ONGOING PROBLEM(S):  HYPERTRIGLYCERIDEMIA:   In 03/2012:  Triglycerides 258, otherwise fasting lipid panel normal. In 7-14 triglycerides 270, HDL 30, total cholesterol 119, LDL 79.  No complications reported from the medications presently being used.    ANEMIA:  On 05/16/2012:  Patient's hemoglobin was 10.1, MCV 73.  On 04/28/2012:  Hemoglobin was 10.4, and 6/14 hemoglobin 9.3, in 10/14 Hb 10.9, mcv 74. The patient denies fatigue, melena or hematochezia. No complications from the medications currently being used.   HTN: Pt 's HTN remains stable.  Denies CP, sob, DOE, headaches, dizziness or visual disturbances.  No complications from the medications currently being used.  Last BP : 150/80,  120/62, 114/62, 140/80, 158/90, 122/64, 140/65, 150/82.  PAST MEDICAL HISTORY : Reviewed.  No changes.  CURRENT MEDICATIONS: Reviewed per [facility]  REVIEW OF SYSTEMS:  Difficult to obtain.  Patient is a poor historian today.    PHYSICAL EXAMINATION  VS:  T 99.4    P 90     RR 21   BP  150/82   POX %     WT (Lb) 226  GENERAL: no acute distress, morbidly obese body habitus EYES: conjunctivae normal, sclerae normal, normal eye lids NECK: patient has a trach LYMPHATICS: difficult to assess due to trach RESPIRATORY: breathing is even & unlabored, BS CTAB CARDIAC: RRR, no murmur,no extra heart sounds EDEMA/VARICOSITIES:  +2 bilateral lower extremity edema  ARTERIAL:  pedal pulses nonpalpable  GI: abdomen soft, normal BS, no masses, no tenderness, no hepatomegaly, no splenomegaly PSYCHIATRIC: the patient is alert & oriented to person, affect & behavior  appropriate  LABS/RADIOLOGY:  10/14 Hb 10.9, mcv 74 ow cbc nl, glc 119 ow cmp nl  9-14 hemoglobin A1c 6.1 7-14 BUN 19, creatinine 1.02 6/14 BUN 30, creatinine 1.25, hemoglobin 9.3, MCV 75 otherwise CBC normal, hemoglobin A1c 6 In 05/2012:  Hemoglobin 10.1, MCV 73, otherwise CBC normal.    CMP normal.  In 04/2012:  Hemoglobin A1C  5.7.    ASSESSMENT/PLAN:   Hypertriglyceridemia.  Stable. Continue current medications.    Iron deficiency anemia.  Hemoglobin improved.  We will monitor.    Hypertension. Blood pressure uncontrolled. Increase Lopressor to 50 mg twice a day  Chronic respiratory failure.  Continue trach support.   Quadriplegia.  Continue supportive care.   Schizophrenia.  Stable.  GERD.  Continue Protonix.   Constipation.  Continue current laxatives.    Chronic pain.  Currently on routine Ultram.    UTI-completed Septra  CPT CODE: [id number]

## 2013-02-24 ENCOUNTER — Non-Acute Institutional Stay (SKILLED_NURSING_FACILITY): Payer: Medicare Other | Admitting: Internal Medicine

## 2013-02-24 DIAGNOSIS — I10 Essential (primary) hypertension: Secondary | ICD-10-CM

## 2013-02-24 DIAGNOSIS — D509 Iron deficiency anemia, unspecified: Secondary | ICD-10-CM

## 2013-02-24 DIAGNOSIS — G4700 Insomnia, unspecified: Secondary | ICD-10-CM

## 2013-02-24 DIAGNOSIS — J9610 Chronic respiratory failure, unspecified whether with hypoxia or hypercapnia: Secondary | ICD-10-CM

## 2013-02-24 NOTE — Progress Notes (Signed)
Patient ID: [name], female   DOB: Oct 30, 1965, 48 y.o.   MRN: [id number]         PROGRESS NOTE  DATE:  02/24/2013  [facility]   LEVEL OF CARE: SNF  Routine Visit  CHIEF COMPLAINT:  Manage iron deficiency anemia, insomnia and hypertension.   HISTORY OF PRESENT ILLNESS:    REASSESSMENT OF ONGOING PROBLEM(S):  ANEMIA:  On 05/16/2012:  Patient's hemoglobin was 10.1, MCV 73.  On 04/28/2012:  Hemoglobin was 10.4, and 6/14 hemoglobin 9.3, in 10/14 Hb 10.9, mcv 74. The patient denies fatigue, melena or hematochezia. No complications from the medications currently being used.   HTN: Pt 's HTN remains stable.  Denies CP, sob, DOE, headaches, dizziness or visual disturbances.  No complications from the medications currently being used.  Last BP : 150/80,  120/62, 114/62, 140/80, 158/90, 122/64, 140/65, 150/82, 139/86.  INSOMNIA: The insomnia remains stable.  No complications noted from the medications presently being used. Patient denies ongoing insomnia, pain, hallucinations, delusions.  PAST MEDICAL HISTORY : Reviewed.  No changes.  [facility]  REVIEW OF SYSTEMS:  Difficult to obtain.  Patient is a poor historian today.    PHYSICAL EXAMINATION  VS:  T 97.9.    P 78     RR 18   BP 139/86   POX %     WT (Lb) 227  GENERAL: no acute distress, morbidly obese body habitus EYES: conjunctivae normal, sclerae normal, normal eye lids NECK: patient has a trach LYMPHATICS: difficult to assess due to trach RESPIRATORY: breathing is even & unlabored, BS CTAB CARDIAC: RRR, no murmur,no extra heart sounds EDEMA/VARICOSITIES:  +2 bilateral lower extremity edema  ARTERIAL:  pedal pulses nonpalpable  GI: abdomen soft, normal BS, no masses, no tenderness, no hepatomegaly, no splenomegaly PSYCHIATRIC: the patient is alert & oriented to person, affect & behavior appropriate  LABS/RADIOLOGY:  12 -14 hemoglobin A1c 5.8  10/14 Hb 10.9, mcv 74 ow cbc nl, glc  119 ow cmp nl  9-14 hemoglobin A1c 6.1 7-14 BUN 19, creatinine 1.02 6/14 BUN 30, creatinine 1.25, hemoglobin 9.3, MCV 75 otherwise CBC normal, hemoglobin A1c 6 In 05/2012:  Hemoglobin 10.1, MCV 73, otherwise CBC normal.    CMP normal.  In 04/2012:  Hemoglobin A1C  5.7.    ASSESSMENT/PLAN:    Iron deficiency anemia.  Hemoglobin improved.  We will monitor.    Hypertension. Blood pressure improved.  Insomnia-continue Ambien  Chronic respiratory failure.  Continue trach support.   Quadriplegia.  Continue supportive care.   Schizophrenia.  Stable.  GERD.  Continue Protonix.   Constipation.  Continue current laxatives.    Chronic pain.  Currently on routine Ultram.    Dysuria-new problem. Check UA culture and sensitivities.  CPT CODE: [id number]

## 2013-03-03 ENCOUNTER — Non-Acute Institutional Stay (SKILLED_NURSING_FACILITY): Payer: Medicare Other | Admitting: Internal Medicine

## 2013-03-03 DIAGNOSIS — N390 Urinary tract infection, site not specified: Secondary | ICD-10-CM

## 2013-03-17 ENCOUNTER — Non-Acute Institutional Stay (SKILLED_NURSING_FACILITY): Payer: Medicare Other | Admitting: Internal Medicine

## 2013-03-17 DIAGNOSIS — I10 Essential (primary) hypertension: Secondary | ICD-10-CM

## 2013-03-17 DIAGNOSIS — D509 Iron deficiency anemia, unspecified: Secondary | ICD-10-CM

## 2013-03-17 DIAGNOSIS — J9610 Chronic respiratory failure, unspecified whether with hypoxia or hypercapnia: Secondary | ICD-10-CM

## 2013-03-17 DIAGNOSIS — G4700 Insomnia, unspecified: Secondary | ICD-10-CM

## 2013-03-18 ENCOUNTER — Other Ambulatory Visit: Payer: Self-pay | Admitting: *Deleted

## 2013-03-18 MED ORDER — TEMAZEPAM 15 MG PO CAPS: ORAL_CAPSULE | ORAL | Status: DC

## 2013-03-18 NOTE — Telephone Encounter (Signed)
[facility] 

## 2013-03-26 NOTE — Progress Notes (Signed)
Patient ID: [name], female   DOB: Nov 23, 1965, 48 y.o.   MRN: [id number]          PROGRESS NOTE  DATE:  03/17/2013  [facility]   LEVEL OF CARE: SNF  Routine Visit  CHIEF COMPLAINT:  Manage iron deficiency anemia, insomnia and hypertension.   HISTORY OF PRESENT ILLNESS:    REASSESSMENT OF ONGOING PROBLEM(S):  ANEMIA:  On 05/16/2012:  Patient's hemoglobin was 10.1, MCV 73.  On 04/28/2012:  Hemoglobin was 10.4, and 6/14 hemoglobin 9.3, in 10/14 Hb 10.9, mcv 74, On 1/29   The patient denies fatigue, melena or hematochezia. No complications from the medications currently being used.   HTN: Pt 's HTN remains stable.  Denies CP, sob, DOE, headaches, dizziness or visual disturbances.  No complications from the medications currently being used.  Last BP : 150/80,  120/62, 114/62, 140/80, 158/90, 122/64, 140/65, 150/82, 139/86, 109/62.  INSOMNIA: The insomnia remains stable.  No complications noted from the medications presently being used. Patient denies ongoing insomnia, pain, hallucinations, delusions.  PAST MEDICAL HISTORY : Reviewed.  No changes.  CURRENT MEDICATIONS: [facility]  REVIEW OF SYSTEMS:  Difficult to obtain.  Patient is a poor historian today.    PHYSICAL EXAMINATION  VS:  T 98.1    P 67     RR 18   BP 109/62   POX %     WT (Lb) 221  GENERAL: no acute distress, morbidly obese body habitus EYES: conjunctivae normal, sclerae normal, normal eye lids NECK: patient has a trach LYMPHATICS: difficult to assess due to trach RESPIRATORY: breathing is even & unlabored, BS CTAB CARDIAC: RRR, no murmur,no extra heart sounds EDEMA/VARICOSITIES:  +2 bilateral lower extremity edema  ARTERIAL:  pedal pulses nonpalpable  GI: abdomen soft, normal BS, no masses, no tenderness, no hepatomegaly, no splenomegaly PSYCHIATRIC: the patient is alert & oriented to person, affect & behavior appropriate  LABS/RADIOLOGY: 1-15 hemoglobin 10.2, MCV 75 otherwise CBC  normal 12 -14 hemoglobin A1c 5.8  10/14 Hb 10.9, mcv 74 ow cbc nl, glc 119 ow cmp nl  9-14 hemoglobin A1c 6.1 7-14 BUN 19, creatinine 1.02 6/14 BUN 30, creatinine 1.25, hemoglobin 9.3, MCV 75 otherwise CBC normal, hemoglobin A1c 6 In 05/2012:  Hemoglobin 10.1, MCV 73, otherwise CBC normal.    CMP normal.  In 04/2012:  Hemoglobin A1C  5.7.    ASSESSMENT/PLAN:    Iron deficiency anemia.  Hemoglobin improved.  We will monitor.    Hypertension. Blood pressure improved.  Insomnia-continue Ambien  Chronic respiratory failure.  Continue trach support.   Quadriplegia.  Continue supportive care.   Schizophrenia.  Stable.  GERD.  Continue Protonix.   Constipation.  Continue current laxatives.    Chronic pain.  Currently on routine Ultram.    CPT CODE: [id number]

## 2013-04-02 DIAGNOSIS — N390 Urinary tract infection, site not specified: Secondary | ICD-10-CM | POA: Insufficient documentation

## 2013-04-02 NOTE — Progress Notes (Signed)
Patient ID: [name], female   DOB: Nov 08, 1965, 48 y.o.   MRN: [id number]          PROGRESS NOTE  DATE: 03/03/2013    [facility]  LEVEL OF CARE: SNF (31)  Acute Visit  CHIEF COMPLAINT:  Manage UTI.    HISTORY OF PRESENT ILLNESS: I was requested by the staff to assess the patient regarding above problem(s):  On 02/25/2013, patient's urinalysis showed turbid appearance, 3+ WBC esterase, 3+ occult blood, WBC greater than 30, RBC 11-30.  Urine culture grows Proteus mirabilis significantly.  Patient is having dysuria, but denies suprapubic pain or flank pain.    PAST MEDICAL HISTORY : Reviewed.  No changes.  CURRENT MEDICATIONS: Review[facility]  REVIEW OF SYSTEMS:  GENERAL: no change in appetite, no fatigue, no weight changes, no fever, chills or weakness RESPIRATORY: no cough, SOB, DOE,, wheezing, hemoptysis CARDIAC: no chest pain or palpitations;  chronic lower extremity swelling    GI: no abdominal pain, diarrhea, constipation, heart burn, nausea or vomiting  PHYSICAL EXAMINATION  GENERAL: no acute distress, morbidly obese body habitus NECK: supple, trachea midline, no neck masses, no thyroid tenderness, no thyromegaly RESPIRATORY: breathing is even & unlabored, BS CTAB CARDIAC: RRR, no murmur,no extra heart sounds EDEMA/VARICOSITIES:  +2 bilateral lower extremity edema    GI: abdomen soft, normal BS, no masses, no tenderness, no hepatomegaly, no splenomegaly PSYCHIATRIC: the patient is alert & oriented to person, affect & behavior appropriate  ASSESSMENT/PLAN:  UTI.  New problem.  Start Augmentin 875 mg b.i.d. for one week and probiotics b.i.d. for 10 days.    CPT CODE: [id number]     Angela CoxGayani Y Josealfredo Adkins, MD [facility] [phone]

## 2013-05-26 ENCOUNTER — Non-Acute Institutional Stay (SKILLED_NURSING_FACILITY): Payer: Medicare Other | Admitting: Adult Health

## 2013-05-26 DIAGNOSIS — J189 Pneumonia, unspecified organism: Secondary | ICD-10-CM

## 2013-05-26 DIAGNOSIS — K5900 Constipation, unspecified: Secondary | ICD-10-CM

## 2013-05-26 DIAGNOSIS — G8250 Quadriplegia, unspecified: Secondary | ICD-10-CM

## 2013-05-26 DIAGNOSIS — D638 Anemia in other chronic diseases classified elsewhere: Secondary | ICD-10-CM

## 2013-05-26 DIAGNOSIS — Z930 Tracheostomy status: Secondary | ICD-10-CM

## 2013-05-26 DIAGNOSIS — F419 Anxiety disorder, unspecified: Secondary | ICD-10-CM

## 2013-05-26 DIAGNOSIS — J9610 Chronic respiratory failure, unspecified whether with hypoxia or hypercapnia: Secondary | ICD-10-CM

## 2013-05-26 DIAGNOSIS — G4700 Insomnia, unspecified: Secondary | ICD-10-CM

## 2013-05-26 DIAGNOSIS — K219 Gastro-esophageal reflux disease without esophagitis: Secondary | ICD-10-CM

## 2013-05-26 DIAGNOSIS — R609 Edema, unspecified: Secondary | ICD-10-CM

## 2013-05-26 DIAGNOSIS — I10 Essential (primary) hypertension: Secondary | ICD-10-CM

## 2013-05-26 DIAGNOSIS — F209 Schizophrenia, unspecified: Secondary | ICD-10-CM

## 2013-05-26 DIAGNOSIS — F411 Generalized anxiety disorder: Secondary | ICD-10-CM

## 2013-06-14 ENCOUNTER — Encounter: Payer: Self-pay | Admitting: Adult Health

## 2013-06-14 DIAGNOSIS — R609 Edema, unspecified: Secondary | ICD-10-CM | POA: Insufficient documentation

## 2013-06-14 DIAGNOSIS — K5900 Constipation, unspecified: Secondary | ICD-10-CM | POA: Insufficient documentation

## 2013-06-14 DIAGNOSIS — K219 Gastro-esophageal reflux disease without esophagitis: Secondary | ICD-10-CM | POA: Insufficient documentation

## 2013-06-14 NOTE — Progress Notes (Signed)
Patient ID: [name], female   DOB: 08-26-1965, 48 y.o.   MRN: [id number]     [place]  Allergies  Allergen Reactions  . Latex Other (See Comments)    Reaction unknown     Chief Complaint  Patient presents with  . Medical Management of Chronic Issues    HPI:  She is being seen for the management of her chronic illnesses. She has a cough and increased sputum production she is concerned that she is developing pneumonia. There are no reports of fever present.   Past Medical History  Diagnosis Date  . Respiratory failure   . Dysphagia   . Contracture of hand joint   . Anemia   . Neurogenic bladder   . Anxiety   . Schizophrenia   . Quadriplegia   . Hypertension   . CHF (congestive heart failure)   . Anginal pain   . Right leg DVT 1980's  . Pneumonia     "today; I get it alot" (07/21/2012)  . History of blood transfusion     "I've had a few" (07/21/2012)  . GERD (gastroesophageal reflux disease)   . Arthritis     "in my hands" (07/21/2012)  . Depression   . Kidney stones     "about 4; 2 different times" (6/92014)  . On home oxygen therapy     "38% q hs" (07/21/2012)    Past Surgical History  Procedure Laterality Date  . Tracheostomy  2011  . Gastrostomy w/ feeding tube    . Tubal ligation  1996  . Cystoscopy/retrograde/ureteroscopy/stone extraction with basket    . Lithotripsy    . Peg placement      VITAL SIGNS BP 144/72  Pulse 81  Ht 5\' 9"  (1.753 m)  Wt 215 lb (97.523 kg)  BMI 31.74 kg/m2   Patient's Medications  New Prescriptions   No medications on file  Previous Medications   ACETAMINOPHEN (TYLENOL) 500 MG TABLET    Take 1,000 mg by mouth 3 (three) times daily.   ALBUTEROL (PROVENTIL) (2.5 MG/3ML) 0.083% NEBULIZER SOLUTION    Take 2.5 mg by nebulization every 6 (six) hours.   ASPIRIN 325 MG TABLET    Take 325 mg by mouth daily.   BISACODYL (DULCOLAX) 10 MG SUPPOSITORY    Place 10 mg rectally every other day as needed for constipation.   CALCIUM-VITAMIN D (OSCAL 500/200 D-3) 500-200 MG-UNIT PER TABLET    Take 1 tablet by mouth every morning.   CLONIDINE (CATAPRES) 0.2 MG TABLET    Take 0.2 mg by mouth 2 (two) times daily.   DOCUSATE SODIUM (COLACE) 100 MG CAPSULE    Take 100 mg by mouth 2 (two) times daily.   FERROUS SULFATE 325 (65 FE) MG TABLET    Take 325 mg by mouth daily with breakfast.   FUROSEMIDE (LASIX) 20 MG TABLET    Take 20 mg by mouth daily.   GUAIFENESIN (ROBITUSSIN) 100 MG/5ML SOLN    Take 15 mLs by mouth every 8 (eight) hours. scheduled   HYDROCODONE-ACETAMINOPHEN (NORCO/VICODIN) 5-325 MG PER TABLET    Take 1 tablet by mouth every 6 (six) hours as needed for moderate pain.   IPRATROPIUM (ATROVENT HFA) 17 MCG/ACT INHALER    Inhale 1 puff into the lungs every 6 (six) hours as needed for wheezing (and shortness of breath).   IPRATROPIUM (ATROVENT) 0.02 % NEBULIZER SOLUTION    Take 500 mcg by nebulization every 6 (six) hours.   METOPROLOL TARTRATE (LOPRESSOR) 25  MG TABLET    Take 50 mg by mouth 2 (two) times daily.    OXYCODONE (ROXICODONE) 15 MG IMMEDIATE RELEASE TABLET    Take 15 mg by mouth daily.    PANTOPRAZOLE SODIUM (PROTONIX) 40 MG/20 ML PACK    Take 40 mg by mouth daily.   POLYETHYLENE GLYCOL (MIRALAX / GLYCOLAX) PACKET    Take 17 g by mouth daily. constipation   RISPERIDONE (RISPERDAL M-TABS) 1 MG DISINTEGRATING TABLET    Take 1 mg by mouth at bedtime.    SUCRALFATE (CARAFATE) 1 G TABLET    Take 1 tablet (1 g total) by mouth 4 (four) times daily -  with meals and at bedtime.   TEMAZEPAM (RESTORIL) 15 MG CAPSULE    Take one capsule by mouth every night at bedtime as needed for insomnia/anxiety   VENLAFAXINE (EFFEXOR) 100 MG TABLET    Take 100 mg by mouth 2 (two) times daily.   ZIPRASIDONE (GEODON) 80 MG CAPSULE    Take 80 mg by mouth 2 (two) times daily with a meal.  Modified Medications   No medications on file  Discontinued Medications   CIPROFLOXACIN (CIPRO) 500 MG TABLET    Take 1 tablet (500 mg total)  by mouth 2 (two) times daily. One po bid x 7 days   FLUTICASONE (FLONASE) 50 MCG/ACT NASAL SPRAY    Place 1 spray into the nose daily.   HYDROXYPROPYL METHYLCELLULOSE (ISOPTO TEARS) 2.5 % OPHTHALMIC SOLUTION    Place 1 drop into both eyes 4 (four) times daily as needed (for dry eyes). Dry eyes   OXYCODONE (ROXICODONE) 15 MG IMMEDIATE RELEASE TABLET    Take one tablet by mouth daily after breakfast; Take one tablet by mouth every 4 hours as needed for pain   OXYCODONE (ROXICODONE) 15 MG IMMEDIATE RELEASE TABLET    1 by mouth daily after breakfast, take 1 by mouth every 4 hours as needed for pain    SIGNIFICANT DIAGNOSTIC EXAMS    Labs reviewed:  11-17-12: wbc 7.9; hgb 1.9; hct 35.3; mcv 74; plt 221; glucose 119; bun 19; creat 0.88; k+4.7; na++137; liver normal albumin 4.1 01-28-13: hgb a1c 5.8 03-10-13: urine culture: proteus mirabilis 03-09-13: chol 135; ldl 56; trig 277 03-12-13: wbc 8.2; hgb 10.2; hct 33.6 ;mcv 75; plt 179 05-06-13; hgb a1c 5.9    Review of Systems  Constitutional: Negative for malaise/fatigue.  Respiratory: Positive for cough and sputum production.   Cardiovascular: Negative for chest pain.  Gastrointestinal: Negative for abdominal pain.  Musculoskeletal: Negative for back pain.  Skin: Negative.   Psychiatric/Behavioral: The patient is not nervous/anxious.       Physical Exam  Constitutional: No distress.  obese  Neck: Neck supple. No JVD present.  Cardiovascular: Normal rate, regular rhythm and intact distal pulses.   Respiratory: Effort normal. No respiratory distress. She has wheezes. She has rales.  Trach present Yellow sputum  GI: Soft. Bowel sounds are normal. She exhibits no distension. There is no tenderness.  peg  Musculoskeletal: She exhibits no edema.  Has quadriplegia   Neurological: She is alert.  Skin: Skin is warm and dry. She is not diaphoretic.       ASSESSMENT/ PLAN:  1. Pneumonia: will begin augmentin 875 mg twice daily for 10  days with florastor twice daily for 10 days will monitor her status.   2. Hypertension: will continue clonidine 0.2 mg twice daily; lopressor 50 mg twice daily  will monitor her status   3. Schizophrenia: is  stable will continue risperdal 1 mg nightly; geodon 80 mg twice daily; will continue effexor 100 mg twice daily for her depressive and anxiety   4. Anemia: will continue iron daily  5. Chronic respiratory failure: is trach dependent; will continue albuterol/atrovent neb every 6 hours; atrovent every 6 hours as needed; robitussin 15 cc every 8 hours routinely   6. Quadriplegia: no change in status; will continue tylenol 1 gm three times daily oxycodone 15 mg daily and every 4 hours as needed for pain management.   7. Edema: will continue lasix 20 mg daily  8. Gerd: will continue protonix 40 mg daily and carafate 1 gm four times daily   9. Constipation; will continue miralax daily and colace twice daily   10. Insomnia: will continue restoril 15 mg nightly

## 2013-06-24 ENCOUNTER — Other Ambulatory Visit: Payer: Self-pay | Admitting: *Deleted

## 2013-06-24 MED ORDER — OXYCODONE HCL 15 MG PO TABS: ORAL_TABLET | ORAL | Status: DC

## 2013-06-24 NOTE — Telephone Encounter (Signed)
[facility] 

## 2013-07-08 ENCOUNTER — Non-Acute Institutional Stay (SKILLED_NURSING_FACILITY): Payer: Medicare Other | Admitting: Internal Medicine

## 2013-07-08 DIAGNOSIS — D509 Iron deficiency anemia, unspecified: Secondary | ICD-10-CM

## 2013-07-08 DIAGNOSIS — G4700 Insomnia, unspecified: Secondary | ICD-10-CM

## 2013-07-08 DIAGNOSIS — J9610 Chronic respiratory failure, unspecified whether with hypoxia or hypercapnia: Secondary | ICD-10-CM

## 2013-07-08 DIAGNOSIS — I10 Essential (primary) hypertension: Secondary | ICD-10-CM

## 2013-07-09 NOTE — Progress Notes (Signed)
Patient ID: [name], female   DOB: October 01, 1965, 48 y.o.   MRN: [id number]           PROGRESS NOTE  DATE:  07/08/2013  FACILITY: [facility]   LEVEL OF CARE: SNF  Routine Visit  CHIEF COMPLAINT:  Manage iron deficiency anemia, insomnia and hypertension.   HISTORY OF PRESENT ILLNESS:    REASSESSMENT OF ONGOING PROBLEM(S):  ANEMIA:  On 05/16/2012:  Patient's hemoglobin was 10.1, MCV 73 on 1/14.  On 04/28/2012:  Hemoglobin was 10.4, and 6/14 hemoglobin 9.3, in 10/14 Hb 10.9, mcv 74, in 4-15 hemoglobin 9.5, MCV 75    The patient denies fatigue, melena or hematochezia. No complications from the medications currently being used.   HTN: Pt 's HTN remains stable.  Denies CP, sob, DOE, headaches, dizziness or visual disturbances.  No complications from the medications currently being used.  Last BP : 150/80,  120/62, 114/62, 140/80, 158/90, 122/64, 140/65, 150/82, 139/86, 109/62, 104/66.  INSOMNIA: The insomnia remains stable.  No complications noted from the medications presently being used. Patient denies ongoing insomnia, pain, hallucinations, delusions.  PAST MEDICAL HISTORY : Reviewed.  No changes.  CURRENT MEDICATIONS: Reviewed per [facility]  REVIEW OF SYSTEMS:  Difficult to obtain.  Patient is a poor historian today.    PHYSICAL EXAMINATION  VS: See vital signs section  GENERAL: no acute distress, morbidly obese body habitus EYES: conjunctivae normal, sclerae normal, normal eye lids NECK: patient has a trach LYMPHATICS: difficult to assess due to trach RESPIRATORY: breathing is even & unlabored, BS CTAB CARDIAC: RRR, no murmur,no extra heart sounds EDEMA/VARICOSITIES:  +1 bilateral lower extremity edema  ARTERIAL:  pedal pulses nonpalpable  GI: abdomen soft, normal BS, no masses, no tenderness, no hepatomegaly, no splenomegaly PSYCHIATRIC: the patient is alert & oriented to person, affect & behavior appropriate  LABS/RADIOLOGY: 4-15 hemoglobin 9.5, MCV 75 otherwise CBC  normal, albumin 3.4 otherwise CMP normal 3-15 hemoglobin A1c 5.9 1-15 hemoglobin 10.2, MCV 75 otherwise CBC normal 12 -14 hemoglobin A1c 5.8  10/14 Hb 10.9, mcv 74 ow cbc nl, glc 119 ow cmp nl  9-14 hemoglobin A1c 6.1 7-14 BUN 19, creatinine 1.02 6/14 BUN 30, creatinine 1.25, hemoglobin 9.3, MCV 75 otherwise CBC normal, hemoglobin A1c 6 In 05/2012:  Hemoglobin 10.1, MCV 73, otherwise CBC normal.    CMP normal.  In 04/2012:  Hemoglobin A1C  5.7.    ASSESSMENT/PLAN:    Iron deficiency anemia.  Hemoglobin improved.  We will monitor.    Hypertension. Well controlled.  Insomnia-Restoril was started  Chronic respiratory failure.  Continue trach support.   Quadriplegia.  Continue supportive care.   Schizophrenia.  Stable. risperdal was decreased  GERD.  Continue Protonix.   Constipation.  Continue current laxatives.    Chronic pain.  Currently on routine Ultram.    CPT CODE: 01027  Newton Pigg. Kerry Dory, MD [facility] [phone]

## 2013-07-26 ENCOUNTER — Non-Acute Institutional Stay (SKILLED_NURSING_FACILITY): Payer: Medicare Other | Admitting: Internal Medicine

## 2013-07-26 DIAGNOSIS — T839XXA Unspecified complication of genitourinary prosthetic device, implant and graft, initial encounter: Secondary | ICD-10-CM

## 2013-07-26 DIAGNOSIS — T8389XA Other specified complication of genitourinary prosthetic devices, implants and grafts, initial encounter: Secondary | ICD-10-CM

## 2013-07-26 DIAGNOSIS — N390 Urinary tract infection, site not specified: Secondary | ICD-10-CM

## 2013-07-28 NOTE — Progress Notes (Addendum)
Patient ID: [name], female   DOB: [birth year], 48 y.o.   MRN: [id number]                  PROGRESS NOTE  DATE:  07/26/2013    [facility]     LEVEL OF CARE:   SNF   Acute Visit   CHIEF COMPLAINT:  Blood in her Foley bag and fever.    HISTORY OF PRESENT ILLNESS:  This is a 48 year-old woman who is quadriparetic, chronic respiratory failure, post tracheostomy and PEG tube dependent.    She has a history of UTIs and has a Foley catheter in place for neurogenic bladder.  Previous cultures have shown gram-negative resistant bacteria for which she often ends up in the hospital.    Staff noted today that she had blood in her Foley catheter and asked for permission to culture her.  Later on, they came back saying she had a temperature of 100.3 and felt lethargic.    REVIEW OF SYSTEMS:   GENERAL:  The patient states she feels sluggish.   CHEST/RESPIRATORY:  No cough.   GI:  No clear diarrhea.    PHYSICAL EXAMINATION:   GENERAL APPEARANCE:  The patient is up in her wheelchair.   CHEST/RESPIRATORY:  Clear air entry bilaterally.   CARDIOVASCULAR:  CARDIAC:   Heart sounds are normal.  She appears to be euvolemic.   GASTROINTESTINAL:  ABDOMEN:   She complains of suprapubic tenderness, but no CVA tenderness.    ASSESSMENT/PLAN:  Hematuria in her Foley, with fever, in a catheterized patient. Likely trauma however  I do not think, given her history, that I have any choice but to give her empiric antibiotics.  Recent cultures have shown multi-drug resistant gram-negatives, especially to advanced generation cephalosporins.  I am going to start her on Ciprofloxacin and await culture here.

## 2013-07-29 ENCOUNTER — Non-Acute Institutional Stay (SKILLED_NURSING_FACILITY): Payer: Medicare Other | Admitting: Internal Medicine

## 2013-07-29 DIAGNOSIS — N179 Acute kidney failure, unspecified: Secondary | ICD-10-CM

## 2013-07-29 DIAGNOSIS — D509 Iron deficiency anemia, unspecified: Secondary | ICD-10-CM

## 2013-07-31 ENCOUNTER — Non-Acute Institutional Stay (SKILLED_NURSING_FACILITY): Payer: Medicare Other | Admitting: Internal Medicine

## 2013-07-31 DIAGNOSIS — I10 Essential (primary) hypertension: Secondary | ICD-10-CM

## 2013-07-31 DIAGNOSIS — J9610 Chronic respiratory failure, unspecified whether with hypoxia or hypercapnia: Secondary | ICD-10-CM

## 2013-07-31 DIAGNOSIS — G4700 Insomnia, unspecified: Secondary | ICD-10-CM

## 2013-07-31 DIAGNOSIS — D509 Iron deficiency anemia, unspecified: Secondary | ICD-10-CM

## 2013-07-31 NOTE — Progress Notes (Signed)
Patient ID: [name], female   DOB: [birth year], 48 y.o.   MRN: [id number]         PROGRESS NOTE  DATE:  07/31/2013  [facility]   LEVEL OF CARE: SNF  Routine Visit  CHIEF COMPLAINT:  Manage iron deficiency anemia, insomnia and hypertension.   HISTORY OF PRESENT ILLNESS:    REASSESSMENT OF ONGOING PROBLEM(S):  ANEMIA:  On 05/16/2012:  Patient's hemoglobin was 10.1, MCV 73 on 1/14.  On 04/28/2012:  Hemoglobin was 10.4, and 6/14 hemoglobin 9.3, in 10/14 Hb 10.9, mcv 74, in 4-15 hemoglobin 9.5, MCV 75, in 6-15 hemoglobin 10, MCV 72.    The patient denies fatigue, melena or hematochezia. No complications from the medications currently being used.   HTN: Pt 's HTN remains stable.  Denies CP, sob, DOE, headaches, dizziness or visual disturbances.  No complications from the medications currently being used.  Last BP : 150/80,  120/62, 114/62, 140/80, 158/90, 122/64, 140/65, 150/82, 139/86, 109/62, 104/66, 158/72.  INSOMNIA: The insomnia remains stable.  No complications noted from the medications presently being used. Patient denies ongoing insomnia, pain, hallucinations, delusions.  PAST MEDICAL HISTORY : Reviewed.  No changes.  [facility]  REVIEW OF SYSTEMS:  Difficult to obtain.  Patient is a poor historian today.    PHYSICAL EXAMINATION  VS: See vital signs section  GENERAL: no acute distress, morbidly obese body habitus EYES: conjunctivae normal, sclerae normal, normal eye lids NECK: patient has a trach LYMPHATICS: difficult to assess due to trach RESPIRATORY: breathing is even & unlabored, BS CTAB CARDIAC: RRR, no murmur,no extra heart sounds EDEMA/VARICOSITIES:  +1 bilateral lower extremity edema  ARTERIAL:  pedal pulses nonpalpable  GI: abdomen soft, normal BS, no masses, no tenderness, no hepatomegaly, no splenomegaly PSYCHIATRIC: the patient is alert & oriented to person, affect & behavior appropriate  LABS/RADIOLOGY: 6-15  hemoglobin 10, MCV 72 otherwise CBC normal, creatinine 1.1 otherwise BMP normal, hemoglobin A1c 6 4-15 hemoglobin 9.5, MCV 75 otherwise CBC normal, albumin 3.4 otherwise CMP normal 3-15 hemoglobin A1c 5.9 1-15 hemoglobin 10.2, MCV 75 otherwise CBC normal 12 -14 hemoglobin A1c 5.8  10/14 Hb 10.9, mcv 74 ow cbc nl, glc 119 ow cmp nl  9-14 hemoglobin A1c 6.1 7-14 BUN 19, creatinine 1.02 6/14 BUN 30, creatinine 1.25, hemoglobin 9.3, MCV 75 otherwise CBC normal, hemoglobin A1c 6 In 05/2012:  Hemoglobin 10.1, MCV 73, otherwise CBC normal.    CMP normal.  In 04/2012:  Hemoglobin A1C  5.7.    ASSESSMENT/PLAN:    Iron deficiency anemia. Stable.    Hypertension. Blood pressure is elevated. We review a log.  Insomnia-on Restoril  Chronic respiratory failure.  Continue trach support.   Quadriplegia.  Continue supportive care.   Schizophrenia.  Stable.   GERD.  Continue Protonix.   Constipation.  Continue current laxatives.    Chronic pain.  Currently on routine Ultram.    CPT CODE: [id number]  Newton PiggGayani Y. Sarah Doryasanayaka, MD [facility] [phone]

## 2013-08-05 NOTE — Progress Notes (Signed)
Patient ID: [name], female   DOB: Oct 26, 1965, 48 y.o.   MRN: [id number]            PROGRESS NOTE  DATE: 07/29/2013        [facility]  LEVEL OF CARE: SNF (31)  Acute Visit  CHIEF COMPLAINT:  Manage iron deficiency anemia and acute renal failure.    HISTORY OF PRESENT ILLNESS: I was requested by the staff to assess the patient regarding above problem(s):  ANEMIA: The anemia has been stable. The patient denies fatigue, melena or hematochezia. No complications from the medications currently being used.  On 07/28/2013:  Hemoglobin 10, MCV 72.  In 05/2013:  Hemoglobin 9.5.    ACUTE RENAL FAILURE:  New problem.  On 07/28/2013:  BUN 17, creatinine 1.01.  In 05/2013:  Creatinine 0.74.  Staff do not report increasing confusion or increasing lower extremity swelling.    PAST MEDICAL HISTORY : Reviewed.  No changes/see problem list  CURRENT MEDICATIONS: Reviewed per [facility]/see medication list  REVIEW OF SYSTEMS:  Difficult to obtain.  Patient overall is a poor historian.    PHYSICAL EXAMINATION  VS:  T 98.7       P       RR  17     BP 158/72     POX 95%       WT (Lb) 215       GENERAL: no acute distress, morbidly obese body habitus LYMPHATICS: no LAN in the neck, no supraclavicular LAN RESPIRATORY: breathing is even & unlabored, BS CTAB CARDIAC: RRR, no murmur,no extra heart sounds, +1 bilateral lower extremity edema    GI: abdomen soft, normal BS, no masses, no tenderness, no hepatomegaly, no splenomegaly PSYCHIATRIC: the patient is alert & oriented to person, affect & behavior appropriate  ASSESSMENT/PLAN:  Acute renal failure.   New problem.  We will monitor.    Iron deficiency anemia.  Hemoglobin improved.    CPT CODE: [id number]        Angela CoxGayani Y Dasanayaka, MD [facility] [phone]

## 2013-08-23 ENCOUNTER — Encounter (HOSPITAL_COMMUNITY): Payer: Self-pay | Admitting: Emergency Medicine

## 2013-08-23 ENCOUNTER — Emergency Department (HOSPITAL_COMMUNITY): Payer: Medicare Other

## 2013-08-23 ENCOUNTER — Emergency Department (HOSPITAL_COMMUNITY)
Admission: EM | Admit: 2013-08-23 | Discharge: 2013-08-23 | Disposition: A | Discharge status: Home / Self Care | Payer: Medicare Other | Attending: Emergency Medicine | Admitting: Emergency Medicine

## 2013-08-23 DIAGNOSIS — F329 Major depressive disorder, single episode, unspecified: Secondary | ICD-10-CM | POA: Diagnosis not present

## 2013-08-23 DIAGNOSIS — Z91040 Latex allergy status: Secondary | ICD-10-CM | POA: Diagnosis not present

## 2013-08-23 DIAGNOSIS — Z9889 Other specified postprocedural states: Secondary | ICD-10-CM | POA: Insufficient documentation

## 2013-08-23 DIAGNOSIS — D649 Anemia, unspecified: Secondary | ICD-10-CM | POA: Insufficient documentation

## 2013-08-23 DIAGNOSIS — F411 Generalized anxiety disorder: Secondary | ICD-10-CM | POA: Diagnosis not present

## 2013-08-23 DIAGNOSIS — K219 Gastro-esophageal reflux disease without esophagitis: Secondary | ICD-10-CM | POA: Diagnosis not present

## 2013-08-23 DIAGNOSIS — I10 Essential (primary) hypertension: Secondary | ICD-10-CM | POA: Insufficient documentation

## 2013-08-23 DIAGNOSIS — Z79899 Other long term (current) drug therapy: Secondary | ICD-10-CM | POA: Diagnosis not present

## 2013-08-23 DIAGNOSIS — N390 Urinary tract infection, site not specified: Secondary | ICD-10-CM

## 2013-08-23 DIAGNOSIS — Z7982 Long term (current) use of aspirin: Secondary | ICD-10-CM | POA: Insufficient documentation

## 2013-08-23 DIAGNOSIS — R1084 Generalized abdominal pain: Secondary | ICD-10-CM | POA: Diagnosis present

## 2013-08-23 DIAGNOSIS — Z8739 Personal history of other diseases of the musculoskeletal system and connective tissue: Secondary | ICD-10-CM | POA: Diagnosis not present

## 2013-08-23 DIAGNOSIS — Z9851 Tubal ligation status: Secondary | ICD-10-CM | POA: Diagnosis not present

## 2013-08-23 DIAGNOSIS — Z87442 Personal history of urinary calculi: Secondary | ICD-10-CM | POA: Diagnosis not present

## 2013-08-23 DIAGNOSIS — M129 Arthropathy, unspecified: Secondary | ICD-10-CM | POA: Diagnosis not present

## 2013-08-23 DIAGNOSIS — Z8709 Personal history of other diseases of the respiratory system: Secondary | ICD-10-CM | POA: Insufficient documentation

## 2013-08-23 DIAGNOSIS — T83511A Infection and inflammatory reaction due to indwelling urethral catheter, initial encounter: Secondary | ICD-10-CM

## 2013-08-23 DIAGNOSIS — Z8669 Personal history of other diseases of the nervous system and sense organs: Secondary | ICD-10-CM | POA: Insufficient documentation

## 2013-08-23 DIAGNOSIS — I509 Heart failure, unspecified: Secondary | ICD-10-CM | POA: Diagnosis not present

## 2013-08-23 DIAGNOSIS — Z9981 Dependence on supplemental oxygen: Secondary | ICD-10-CM | POA: Diagnosis not present

## 2013-08-23 DIAGNOSIS — F3289 Other specified depressive episodes: Secondary | ICD-10-CM | POA: Insufficient documentation

## 2013-08-23 DIAGNOSIS — R109 Unspecified abdominal pain: Secondary | ICD-10-CM

## 2013-08-23 DIAGNOSIS — F209 Schizophrenia, unspecified: Secondary | ICD-10-CM | POA: Diagnosis not present

## 2013-08-23 DIAGNOSIS — Z8701 Personal history of pneumonia (recurrent): Secondary | ICD-10-CM | POA: Diagnosis not present

## 2013-08-23 DIAGNOSIS — Z86718 Personal history of other venous thrombosis and embolism: Secondary | ICD-10-CM | POA: Diagnosis not present

## 2013-08-23 LAB — CBC WITH DIFFERENTIAL/PLATELET
BASOS PCT: 0 % (ref 0–1)
Basophils Absolute: 0 10*3/uL (ref 0.0–0.1)
EOS ABS: 0.1 10*3/uL (ref 0.0–0.7)
EOS PCT: 1 % (ref 0–5)
HEMATOCRIT: 31.9 % — AB (ref 36.0–46.0)
HEMOGLOBIN: 9.2 g/dL — AB (ref 12.0–15.0)
Lymphocytes Relative: 23 % (ref 12–46)
Lymphs Abs: 2 10*3/uL (ref 0.7–4.0)
MCH: 22.3 pg — AB (ref 26.0–34.0)
MCHC: 28.8 g/dL — AB (ref 30.0–36.0)
MCV: 77.2 fL — ABNORMAL LOW (ref 78.0–100.0)
MONO ABS: 0.8 10*3/uL (ref 0.1–1.0)
MONOS PCT: 9 % (ref 3–12)
Neutro Abs: 5.9 10*3/uL (ref 1.7–7.7)
Neutrophils Relative %: 67 % (ref 43–77)
Platelets: 193 10*3/uL (ref 150–400)
RBC: 4.13 MIL/uL (ref 3.87–5.11)
RDW: 12.8 % (ref 11.5–15.5)
WBC: 8.8 10*3/uL (ref 4.0–10.5)

## 2013-08-23 LAB — COMPREHENSIVE METABOLIC PANEL
ALBUMIN: 3 g/dL — AB (ref 3.5–5.2)
ALT: 7 U/L (ref 0–35)
ANION GAP: 9 (ref 5–15)
AST: 11 U/L (ref 0–37)
Alkaline Phosphatase: 71 U/L (ref 39–117)
BUN: 19 mg/dL (ref 6–23)
CALCIUM: 9.6 mg/dL (ref 8.4–10.5)
CO2: 37 mEq/L — ABNORMAL HIGH (ref 19–32)
CREATININE: 0.81 mg/dL (ref 0.50–1.10)
Chloride: 88 mEq/L — ABNORMAL LOW (ref 96–112)
GFR calc Af Amer: >90 mL/min (ref >90)
GFR calc non Af Amer: 85 mL/min — ABNORMAL LOW (ref >90)
Glucose, Bld: 111 mg/dL — ABNORMAL HIGH (ref 70–99)
Potassium: 4 mEq/L (ref 3.7–5.3)
Sodium: 134 mEq/L — ABNORMAL LOW (ref 137–147)
TOTAL PROTEIN: 6.9 g/dL (ref 6.0–8.3)

## 2013-08-23 LAB — URINALYSIS, ROUTINE W REFLEX MICROSCOPIC
Bilirubin Urine: NEGATIVE
GLUCOSE, UA: NEGATIVE mg/dL
KETONES UR: NEGATIVE mg/dL
Nitrite: NEGATIVE
PROTEIN: 100 mg/dL — AB
Specific Gravity, Urine: 1.013 (ref 1.005–1.030)
Urobilinogen, UA: 0.2 mg/dL (ref 0.0–1.0)
pH: 8 (ref 5.0–8.0)

## 2013-08-23 LAB — URINE MICROSCOPIC-ADD ON

## 2013-08-23 LAB — LIPASE, BLOOD: LIPASE: 11 U/L (ref 11–59)

## 2013-08-23 LAB — I-STAT TROPONIN, ED: Troponin i, poc: 0 ng/mL (ref 0.00–0.08)

## 2013-08-23 MED ORDER — LEVOFLOXACIN IN D5W 750 MG/150ML IV SOLN
750.0000 mg | Freq: Once | INTRAVENOUS | Status: AC
  Administered 2013-08-23: 750 mg via INTRAVENOUS
  Filled 2013-08-23: qty 150

## 2013-08-23 MED ORDER — HEPARIN SOD (PORK) LOCK FLUSH 100 UNIT/ML IV SOLN
500.0000 [IU] | Freq: Once | INTRAVENOUS | Status: AC
  Administered 2013-08-23: 500 [IU]
  Filled 2013-08-23: qty 5

## 2013-08-23 MED ORDER — SODIUM CHLORIDE 0.9 % IV BOLUS (SEPSIS)
500.0000 mL | Freq: Once | INTRAVENOUS | Status: AC
  Administered 2013-08-23: 500 mL via INTRAVENOUS

## 2013-08-23 MED ORDER — IOHEXOL 300 MG/ML  SOLN
100.0000 mL | Freq: Once | INTRAMUSCULAR | Status: AC | PRN
  Administered 2013-08-23: 100 mL via INTRAVENOUS

## 2013-08-23 MED ORDER — LEVOFLOXACIN 750 MG PO TABS: 750.0000 mg | ORAL_TABLET | Freq: Every day | ORAL | Status: DC

## 2013-08-23 MED ORDER — IOHEXOL 300 MG/ML  SOLN
25.0000 mL | Freq: Once | INTRAMUSCULAR | Status: AC | PRN
Start: 2013-08-23 — End: 2013-08-23
  Administered 2013-08-23: 25 mL via ORAL

## 2013-08-23 NOTE — ED Notes (Signed)
[facility]; staff reports chest pain, SOB and tightness for 3 days and abdominal pain for 3 days. Staff gave a breathing treatment and 3 nitro tabs. Patient reports chest pain in left side of chest that feels like congestion to her. She reports her stomach hurts worse. Urine in catheter bag and tubing is very cloudy and has strong odor.

## 2013-08-23 NOTE — ED Notes (Signed)
Patient transported to CT 

## 2013-08-23 NOTE — ED Notes (Signed)
Notified [facility] for transportation back home 

## 2013-08-23 NOTE — ED Notes (Signed)
Patient returned from X-ray 

## 2013-08-23 NOTE — ED Notes (Signed)
IV team paged for port access 

## 2013-08-23 NOTE — ED Notes (Signed)
Patient transported to X-ray 

## 2013-08-23 NOTE — Discharge Instructions (Signed)
Levaquin as prescribed.  Return to the emergency department for severe abdominal pain, high fever, or other new and concerning symptoms.   Catheter-Associated Urinary Tract Infection FAQs WHAT IS "CATHETER-ASSOCIATED" URINARY TRACT INFECTION? A urinary tract infection (also called "UTI") is an infection in the urinary system, which includes the bladder (which stores the urine) and the kidneys (which filter the blood to make urine). Germs (for example, bacteria or yeasts) do not normally live in these areas; but if germs are introduced, an infection can occur. If you have a urinary catheter, germs can travel along the catheter and cause an infection in your bladder or your kidney; in that case it is called a catheter-associated urinary tract infection (or "CA-UTI").  WHAT IS A URINARY CATHETER? A urinary catheter is a thin tube placed in the bladder to drain urine. Urine drains through the tube into a bag that collects the urine. A urinary  catheter may be used:  If you are not able to urinate on your own.  To measure the amount of urine that you make, for example, during intensive care.  During and after some types of surgery.  During some tests of the kidneys and bladder . People with urinary catheters have a much higher chance of getting a urinary tract infection than people who don't have a catheter. HOW DO I GET A CATHETER-ASSOCIATED URINARY TRACT INFECTION (CA-UTI)? If germs enter the urinary tract, they may cause an infection. Many of the germs that cause a catheter-associated urinary tract infection are common germs found in your intestines that do not usually cause an infection there. Germs can enter the urinary tract when the catheter is being put in or while the catheter remains in the bladder.  WHAT ARE THE SYMPTOMS OF A URINARY TRACT INFECTION?  Some of the common symptoms of a urinary tract infection are:  Burning or pain in the lower abdomen (that is, below the  stomach).  Fever.  Bloody urine may be a sign of infection, but is also caused by other problems .  Burning during urination or an increase in the frequency of urination after the catheter is removed. Sometimes people with catheter-associated urinary tract infections do not have these symptoms of infection. CAN CATHETER-ASSOCIATED URINARY TRACT INFECTIONS BE TREATED? Yes, most catheter-associated urinary tract infections can be treated with antibiotics and removal or change of the catheter. Your doctor will determine which antibiotic is best for you.  WHAT ARE SOME OF THE THINGS THAT HOSPITALS ARE DOING TO PREVENT CATHETER-ASSOCIATED URINARY TRACT INFECTIONS? To prevent urinary tract infections, doctors and nurses take the following actions.  Catheter insertion  Catheters are put in only when necessary and they are removed as soon as possible.  Only properly trained persons insert catheters using sterile ("clean") technique.  The skin in the area where the catheter will be inserted is cleaned before inserting the catheter.  Other methods to drain the urine are sometimes used, such as:  External catheters in men (these look like condoms and are placed over the penis rather than into the penis)  Putting a temporary catheter in to drain the urine and removing it right away. This is called intermittent urethral catheterization. Catheter care  Healthcare providers clean their hands by washing them with soap and water or using an alcohol-based hand rub before and after touching your catheter.  If you do not see your providers clean their hands, please ask them to do so.  Avoid disconnecting the catheter and drain tube. This  helps to prevent germs from getting into the catheter tube.  The catheter is secured to the leg to prevent pulling on the catheter.  Avoid twisting or kinking the catheter.  Keep the bag lower than the bladder to prevent urine from backflowing to the  bladder.  Empty the bag regularly. The drainage spout should not touch anything while emptying the bag. WHAT CAN I DO TO HELP PREVENT CATHETER-ASSOCIATED URINARY TRACT INFECTIONS IF I HAVE A CATHETER?  Always clean your hands before and after doing catheter care.  Always keep your urine bag below the level of your bladder.  Do not tug or pull on the tubing.  Do not twist or kink the catheter tubing.  Ask your healthcare provider each day if you still need the catheter. WHAT DO I NEED TO DO WHEN I GO HOME FROM THE HOSPITAL?  If you will be going home with a catheter, your doctor or nurse should explain everything you need to know about taking care of the catheter. Make sure you understand how to care for it before you leave the hospital.  If you develop any of the symptoms of a urinary tract infection, such as burning or pain in the lower abdomen, fever, or an increase in the frequency of urination, contact your doctor or nurse immediately.  Before you go home, make sure you know who to contact if you have questions or problems after you get home. If you have questions, please ask your doctor or nurse. Developed and co-sponsored by [employer] for [employer] of [place] [phone](SHEA); Infectious Diseases Society of America (IDSA); [facility]; Association for Professionals in Infection Control and Epidemiology (APIC); Center for Disease Control (CDC); and The Joint Commission Document Released: 10/24/2011 Document Reviewed: 10/24/2011 [facility] Patient Information 2015 [employer] [place]. This information is not intended to replace advice given to you by your health care provider. Make sure you discuss any questions you have with your health care provider.

## 2013-08-23 NOTE — ED Notes (Signed)
Patient returned from CT

## 2013-08-23 NOTE — ED Notes (Signed)
[facility] requested 
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
## 2013-08-25 ENCOUNTER — Non-Acute Institutional Stay (SKILLED_NURSING_FACILITY): Payer: Medicare Other | Admitting: Internal Medicine

## 2013-08-25 DIAGNOSIS — I10 Essential (primary) hypertension: Secondary | ICD-10-CM

## 2013-08-25 DIAGNOSIS — N3000 Acute cystitis without hematuria: Secondary | ICD-10-CM

## 2013-08-25 NOTE — Progress Notes (Signed)
Patient ID: [name], female   DOB: [birth year], 48 y.o.   MRN: [id number] Facility [facility] Chief complaint; review of high blood pressure trip to the ER complaining of chest pain. History; [name] arrived back from a urology appointment on Friday with the noted to have high blood pressure in the urologist office. She was there for change of her Foley catheter. Her blood pressure him return to the facility is 170/100. The patient was complaining of some shortness of breath. I ordered a monitoring of her blood pressure and vital signs. Over the next 2 days so her blood pressure ranged from 124/88 up to a high of 170/100 on 7/12. The rest of her vital signs were generally stable. She went to the ER in 7/12 after complaining of chest pain in the facility. It appeared that she was more worked up for abdominal pain per description in the ER. She had a CT scan of the abdomen and pelvis as listed below. Her urine culture which was a cath specimen shows multiple bacterial types this is not usually indicative of an infection. She was put on Levaquin for 7 days.  Her CT scan of the abdomen that showed some changes in the urinary collecting system suggestive of infection therefore I will continue the Levaquin. Also had moderate stool burden.  Review of systems Respiratory she is not complaining of shortness of breath Cardiac no chest pain Abdomen she is not complaining of abdominal pain however she is complaining of nausea without vomiting  Physical examination Respiratory shallow but otherwise clear entry Cardiac heart sounds are normal she appears to be euvolemic Abdomen her abdomen is distended and firm however bowel sounds are positive there is no obvious mass Extremities no evidence of a DVT  Current Outpatient Prescriptions on File Prior to Visit  Medication Sig Dispense Refill  . acetaminophen (TYLENOL) 500 MG tablet Take 1,000 mg by mouth 3 (three) times daily.      Marland Kitchen. albuterol  (PROVENTIL) (2.5 MG/3ML) 0.083% nebulizer solution Take 2.5 mg by nebulization every 6 (six) hours.      Marland Kitchen. aspirin 325 MG tablet Take 325 mg by mouth daily.      . bisacodyl (DULCOLAX) 10 MG suppository Place 10 mg rectally every other day as needed for constipation.      . calcium-vitamin D (OSCAL 500/200 D-3) 500-200 MG-UNIT per tablet Take 1 tablet by mouth every morning.      . cloNIDine (CATAPRES) 0.2 MG tablet Take 0.2 mg by mouth 2 (two) times daily.      Marland Kitchen. docusate sodium (COLACE) 100 MG capsule Take 100 mg by mouth 2 (two) times daily.      . ferrous sulfate 325 (65 FE) MG tablet Take 325 mg by mouth daily with breakfast.      . furosemide (LASIX) 20 MG tablet Take 20 mg by mouth daily.      Marland Kitchen. guaiFENesin (ROBITUSSIN) 100 MG/5ML SOLN Take 15 mLs by mouth every 8 (eight) hours. scheduled      . HYDROcodone-acetaminophen (NORCO/VICODIN) 5-325 MG per tablet Take 1 tablet by mouth every 6 (six) hours as needed for moderate pain.      Marland Kitchen. ipratropium (ATROVENT HFA) 17 MCG/ACT inhaler Inhale 1 puff into the lungs every 6 (six) hours as needed for wheezing (and shortness of breath).      Marland Kitchen. ipratropium (ATROVENT) 0.02 % nebulizer solution Take 500 mcg by nebulization every 6 (six) hours.      Marland Kitchen. levofloxacin (LEVAQUIN) 750  MG tablet Take 1 tablet (750 mg total) by mouth daily. X 7 days  7 tablet  0  . metoprolol tartrate (LOPRESSOR) 25 MG tablet Take 50 mg by mouth 2 (two) times daily.       Marland Kitchen oxyCODONE (ROXICODONE) 15 MG immediate release tablet Take 15 mg by mouth daily after breakfast.      . pantoprazole sodium (PROTONIX) 40 mg/20 mL PACK Take 40 mg by mouth daily.      . polyethylene glycol (MIRALAX / GLYCOLAX) packet Take 17 g by mouth daily. constipation      . Polyvinyl Alcohol-Povidone (FRESHKOTE) 2.7-2 % SOLN Place 1 drop into both eyes 4 (four) times daily.      . risperiDONE (RISPERDAL M-TABS) 1 MG disintegrating tablet Take 1 mg by mouth at bedtime.       . sucralfate (CARAFATE) 1 G  tablet Take 1 tablet (1 g total) by mouth 4 (four) times daily -  with meals and at bedtime.      . temazepam (RESTORIL) 15 MG capsule Take 15 mg by mouth at bedtime.      Marland Kitchen venlafaxine (EFFEXOR) 100 MG tablet Take 100 mg by mouth 2 (two) times daily.      . ziprasidone (GEODON) 40 MG capsule Take 40 mg by mouth daily at 12 noon.      . ziprasidone (GEODON) 80 MG capsule Take 80 mg by mouth 2 (two) times daily with a meal.       No current facility-administered medications on file prior to visit.    Impression/plan #1 complaints of chest pain she is not complaining of this currently. #2 question UTI on Levaquin. Her urine culture showed multiple bacterial types over there were some changes on her CT scan of the pelvis that suggested possible infection there from lobby antibiotics to complete #3 constipation I am going to start her on a daily Duphalac suppository to be given in the morning. She is already on MiraLax 17 g daily #4 hypertension she is on Catapres 0.2 twice a day. She is all so on Lopressor 50 twice a day. I also wonder whether or Catapres is the best choice of antihypertensive your given its propensity to cause among other things depression. Clearly however she is still running high. I have increased the metoprolol, added norvasc 5 mg I will begin to taper the clonidine.

## 2013-09-07 ENCOUNTER — Non-Acute Institutional Stay (SKILLED_NURSING_FACILITY): Payer: Medicare Other | Admitting: Internal Medicine

## 2013-09-07 DIAGNOSIS — I10 Essential (primary) hypertension: Secondary | ICD-10-CM

## 2013-09-07 DIAGNOSIS — D509 Iron deficiency anemia, unspecified: Secondary | ICD-10-CM

## 2013-09-07 DIAGNOSIS — J9610 Chronic respiratory failure, unspecified whether with hypoxia or hypercapnia: Secondary | ICD-10-CM

## 2013-09-07 DIAGNOSIS — G4700 Insomnia, unspecified: Secondary | ICD-10-CM

## 2013-09-07 NOTE — Progress Notes (Signed)
Patient ID: [name], female   DOB: 05-22-1965, 48 y.o.   MRN: [id number]         PROGRESS NOTE  DATE:  09/07/2013  [facility]AC[facility]   LEVEL OF CARE: SNF  Routine Visit  CHIEF COMPLAINT:  Manage iron deficiency anemia, insomnia and hypertension.   HISTORY OF PRESENT ILLNESS:    REASSESSMENT OF ONGOING PROBLEM(S):  ANEMIA:  On 05/16/2012:  Patient's hemoglobin was 10.1, MCV 73 on 1/14.  On 04/28/2012:  Hemoglobin was 10.4, and 6/14 hemoglobin 9.3, in 10/14 Hb 10.9, mcv 74, in 4-15 hemoglobin 9.5, MCV 75, in 6-15 hemoglobin 10, MCV 72.    The patient denies fatigue, melena or hematochezia. No complications from the medications currently being used.   HTN: Pt 's HTN remains stable.  Denies CP, sob, DOE, headaches, dizziness or visual disturbances.  No complications from the medications currently being used.  Last BP : 150/80,  120/62, 114/62, 140/80, 158/90, 122/64, 140/65, 150/82, 139/86, 109/62, 104/66, 158/72, 110/60.  INSOMNIA: The insomnia remains stable.  No complications noted from the medications presently being used. Patient denies ongoing insomnia, pain, hallucinations, delusions.  PAST MEDICAL HISTORY : Reviewed.  No changes.  CURRENT MEDICATIONS: [facility]  REVIEW OF SYSTEMS:  Difficult to obtain.  Patient is a poor historian  PHYSICAL EXAMINATION  VS: See vital signs section  GENERAL: no acute distress, morbidly obese body habitus EYES: conjunctivae normal, sclerae normal, normal eye lids NECK: patient has a trach LYMPHATICS: difficult to assess due to trach RESPIRATORY: breathing is even & unlabored, BS CTAB CARDIAC: RRR, no murmur,no extra heart sounds EDEMA/VARICOSITIES:  +1 bilateral lower extremity edema  ARTERIAL:  pedal pulses nonpalpable  GI: abdomen soft, normal BS, no masses, no tenderness, no hepatomegaly, no splenomegaly PSYCHIATRIC: the patient is alert & oriented to person, affect & behavior appropriate  LABS/RADIOLOGY: 6-15  hemoglobin 10, MCV 72 otherwise CBC normal, creatinine 1.1 otherwise BMP normal, hemoglobin A1c 6 4-15 hemoglobin 9.5, MCV 75 otherwise CBC normal, albumin 3.4 otherwise CMP normal 3-15 hemoglobin A1c 5.9 1-15 hemoglobin 10.2, MCV 75 otherwise CBC normal 12 -14 hemoglobin A1c 5.8  10/14 Hb 10.9, mcv 74 ow cbc nl, glc 119 ow cmp nl  9-14 hemoglobin A1c 6.1 7-14 BUN 19, creatinine 1.02 6/14 BUN 30, creatinine 1.25, hemoglobin 9.3, MCV 75 otherwise CBC normal, hemoglobin A1c 6 In 05/2012:  Hemoglobin 10.1, MCV 73, otherwise CBC normal.    CMP normal.  In 04/2012:  Hemoglobin A1C  5.7.    ASSESSMENT/PLAN:    Iron deficiency anemia. Stable.    Hypertension. Controlled. Clonidine being tapered off, Norvasc and metoprolol increased.  Insomnia-on Restoril  Chronic respiratory failure.  Continue trach support.   Quadriplegia.  Continue supportive care.   Schizophrenia.  Stable.   GERD.  Continue Protonix.   Constipation.  Continue current laxatives.    Chronic pain.  Currently on routine Ultram.    CPT CODE: 4098199309  Newton PiggGayani Y. Sarah Doryasanayaka, MD [facility] [phone]

## 2013-09-17 ENCOUNTER — Other Ambulatory Visit: Payer: Self-pay | Admitting: *Deleted

## 2013-09-17 MED ORDER — OXYCODONE HCL 15 MG PO TABS: ORAL_TABLET | ORAL | Status: DC

## 2013-09-17 NOTE — Telephone Encounter (Signed)
[facility] 

## 2013-09-24 ENCOUNTER — Non-Acute Institutional Stay (SKILLED_NURSING_FACILITY): Payer: Medicare Other | Admitting: Internal Medicine

## 2013-09-24 DIAGNOSIS — R319 Hematuria, unspecified: Secondary | ICD-10-CM

## 2013-09-29 NOTE — Progress Notes (Signed)
Patient ID: [name], female   DOB: Jul 21, 1965, 48 y.o.   MRN: [id number]           PROGRESS NOTE  DATE: 09/24/2013           FAC[facility]  LEVEL OF CARE: SNF (31)  Acute Visit  CHIEF COMPLAINT:  Manage hypothermia and hematuria.    HISTORY OF PRESENT ILLNESS: I was requested by the staff to assess the patient regarding above problem(s):  Staff report that patient is having new onset hematuria and temperature was 96.6 this morning.  Patient overall is a poor historian.  Patient was recently diagnosed with calculus of kidney by the urologist and plan is to follow up in two weeks with CT urogram.    PAST MEDICAL HISTORY : Reviewed.  No changes/see problem list  CURRENT MEDICATIONS: Reviewed per MAR/see medication list  REVIEW OF SYSTEMS:  Unobtainable.  Patient is a poor historian.       PHYSICAL EXAMINATION  VS: see VS section  GENERAL: no acute distress, morbidly obese body habitus NECK: patient has a trach, supple, trachea midline, no neck masses, no thyroid tenderness, no thyromegaly RESPIRATORY: breathing is even & unlabored, BS CTAB CARDIAC: RRR, no murmur,no extra heart sounds, +1 bilateral lower extremity edema    GI: abdomen soft, normal BS, no masses, no tenderness, no hepatomegaly, no splenomegaly PSYCHIATRIC: the patient is alert & oriented to person, affect & behavior appropriate  ASSESSMENT/PLAN:  Hematuria.  New problem.  Patient may have infection secondary to the calculus.  Vantin 100 mg b.i.d. for seven days started.  Add probiotics b.i.d. for 10 days.  Follow up with urologist as planned for the renal calculus.    CPT CODE: 4098199308           Angela CoxGayani Y Dasanayaka, MD [facility] [phone]

## 2013-10-02 ENCOUNTER — Other Ambulatory Visit: Payer: Self-pay | Admitting: *Deleted

## 2013-10-02 MED ORDER — HYDROCODONE-ACETAMINOPHEN 5-325 MG PO TABS: ORAL_TABLET | ORAL | Status: DC

## 2013-10-02 NOTE — Telephone Encounter (Signed)
[facility] 

## 2013-10-10 ENCOUNTER — Non-Acute Institutional Stay (SKILLED_NURSING_FACILITY): Payer: Medicare Other | Admitting: Internal Medicine

## 2013-10-10 DIAGNOSIS — J9610 Chronic respiratory failure, unspecified whether with hypoxia or hypercapnia: Secondary | ICD-10-CM

## 2013-10-12 ENCOUNTER — Non-Acute Institutional Stay (SKILLED_NURSING_FACILITY): Payer: Medicare Other | Admitting: Internal Medicine

## 2013-10-12 DIAGNOSIS — J9610 Chronic respiratory failure, unspecified whether with hypoxia or hypercapnia: Secondary | ICD-10-CM

## 2013-10-12 DIAGNOSIS — G4700 Insomnia, unspecified: Secondary | ICD-10-CM

## 2013-10-12 DIAGNOSIS — D509 Iron deficiency anemia, unspecified: Secondary | ICD-10-CM

## 2013-10-12 DIAGNOSIS — I10 Essential (primary) hypertension: Secondary | ICD-10-CM

## 2013-10-12 NOTE — Progress Notes (Signed)
Patient ID: [name], female   DOB: [birth year], 48 y.o.   MRN: [id number]         PROGRESS NOTE  DATE:  10/12/2013  FACILITY: [facility]   LEVEL OF CARE: SNF  Routine Visit  CHIEF COMPLAINT:  Manage iron deficiency anemia, insomnia and hypertension.   HISTORY OF PRESENT ILLNESS:    REASSESSMENT OF ONGOING PROBLEM(S):  ANEMIA:  On 05/16/2012:  Patient's hemoglobin was 10.1, MCV 73 on 1/14.  On 04/28/2012:  Hemoglobin was 10.4, and 6/14 hemoglobin 9.3, in 10/14 Hb 10.9, mcv 74, in 4-15 hemoglobin 9.5, MCV 75, in 6-15 hemoglobin 10, MCV 72, in 7-15 hemoglobin 11.4.    The patient denies fatigue, melena or hematochezia. No complications from the medications currently being used.   HTN: Pt 's HTN remains stable.  Denies CP, sob, DOE, headaches, dizziness or visual disturbances.  No complications from the medications currently being used.  Last BP : 150/80,  120/62, 114/62, 140/80, 158/90, 122/64, 140/65, 150/82, 139/86, 109/62, 104/66, 158/72, 110/60, 124/68.  INSOMNIA: The insomnia remains stable.  No complications noted from the medications presently being used. Patient denies ongoing insomnia, pain, hallucinations, delusions.  PAST MEDICAL HISTORY : Reviewed.  No changes.  CURRENT MEDICATIONS: Reviewed per [facility]  REVIEW OF SYSTEMS:  Difficult to obtain.  Patient is a poor historian  PHYSICAL EXAMINATION  VS: See vital signs section  GENERAL: no acute distress, morbidly obese body habitus EYES: conjunctivae normal, sclerae normal, normal eye lids NECK: patient has a trach LYMPHATICS: difficult to assess due to trach RESPIRATORY: breathing is even & unlabored, BS CTAB CARDIAC: RRR, no murmur,no extra heart sounds EDEMA/VARICOSITIES:  +1 bilateral lower extremity edema  ARTERIAL:  pedal pulses nonpalpable  GI: abdomen soft, normal BS, no masses, no tenderness, no hepatomegaly, no splenomegaly PSYCHIATRIC: the patient is alert & oriented to person, affect & behavior  appropriate  LABS/RADIOLOGY: 7-15 CBC normal, triglycerides 159 otherwise FLP normal 6-15 hemoglobin 10, MCV 72 otherwise CBC normal, creatinine 1.1 otherwise BMP normal, hemoglobin A1c 6 4-15 hemoglobin 9.5, MCV 75 otherwise CBC normal, albumin 3.4 otherwise CMP normal 3-15 hemoglobin A1c 5.9 1-15 hemoglobin 10.2, MCV 75 otherwise CBC normal 12 -14 hemoglobin A1c 5.8  10/14 Hb 10.9, mcv 74 ow cbc nl, glc 119 ow cmp nl  9-14 hemoglobin A1c 6.1 7-14 BUN 19, creatinine 1.02 6/14 BUN 30, creatinine 1.25, hemoglobin 9.3, MCV 75 otherwise CBC normal, hemoglobin A1c 6 In 05/2012:  Hemoglobin 10.1, MCV 73, otherwise CBC normal.    CMP normal.  In 04/2012:  Hemoglobin A1C  5.7.    ASSESSMENT/PLAN:    Iron deficiency anemia. Hemoglobin normalized. Discontinue iron.  Hypertension. Controlled.   Insomnia-on Restoril  Chronic respiratory failure.  Continue trach support.   Quadriplegia.  Continue supportive care.   Schizophrenia.  Stable.   GERD.  Continue Protonix.   Constipation.  Continue current laxatives.    Chronic pain.  Currently on routine Ultram.    CPT CODE: 40981  Newton Pigg. Kerry Dory, MD [facility] [phone]

## 2013-10-15 NOTE — Progress Notes (Addendum)
Patient ID: [name], female   DOB: 06/09/1965, 48 y.o.   MRN: [id number]               PROGRESS NOTE  DATE:  10/09/2013    FACILITY: [facility]    LEVEL OF CARE:   SNF   Acute Visit   CHIEF COMPLAINT:  Increasing shortness of breath.    HISTORY OF PRESENT ILLNESS:  I was stopped by this patient in the hallway to a report that she felt short of breath and felt like she needed a chest x-ray.    She is a patient who has C5 quadriplegia chronically.  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  She can motorize around in her chair.    She has a history of UTIs and nephrolithiasis with a chronic indwelling Foley catheter.    In the past, I have felt that this lady probably does not ventilate very well.  I looked back today through her blood gases in October 2013 and she had a pCO2 in the 60s, although I do not see on what flow of oxygen that was nor am I sure of her clinical stability.  In any case, I have always taken her complaints about shortness of breath seriously.    CURRENT MEDICATIONS:  Medication list is reviewed.    She is on Albuterol nebulizers and Atrovent nebulizers every six hours via her trach collar.  She has trach suctioning.  Whether she has underlying obstructive lung disease, I am not sure.    PHYSICAL EXAMINATION:   VITAL SIGNS:   O2 SATURATIONS:  93% on room air.    PULSE:  96.   RESPIRATIONS:  24-28.   GENERAL APPEARANCE:  The patient is sitting up in her chair.  She is slightly tachypneic.   However, she is awake, alert, speaks in her soft voice as usual.   CHEST/RESPIRATORY:  It is difficult to auscultate.  However, her  air entry is reduced.  That is not surprising.  However, I think she has a prolonged expiratory phase.   CARDIOVASCULAR:  CARDIAC:   Heart sounds are normal.  Her JVP is not elevated.   EDEMA/VARICOSITIES:  She has no edema.    ASSESSMENT/PLAN:  Shortness of breath.  I am always concerned about this  patient when she complains of this.  She is on regular bronchodilators.  Her underlying lung/airway status is unclear.  I am going to order the chest x-ray that she requested.  She did not have any sputum.  I do not think there is anything to suggest any requirement for antibiotics at this point.

## 2013-10-19 ENCOUNTER — Inpatient Hospital Stay (HOSPITAL_COMMUNITY)
Admission: EM | Admit: 2013-10-19 | Discharge: 2013-10-28 | DRG: 871 | Disposition: A | Discharge status: Skilled Nursing Facility | Payer: Medicare Other | Attending: Internal Medicine | Admitting: Internal Medicine

## 2013-10-19 ENCOUNTER — Inpatient Hospital Stay (HOSPITAL_COMMUNITY): Payer: Medicare Other

## 2013-10-19 ENCOUNTER — Encounter (HOSPITAL_COMMUNITY): Payer: Self-pay | Admitting: Emergency Medicine

## 2013-10-19 ENCOUNTER — Emergency Department (HOSPITAL_COMMUNITY): Payer: Medicare Other

## 2013-10-19 DIAGNOSIS — Z930 Tracheostomy status: Secondary | ICD-10-CM | POA: Diagnosis not present

## 2013-10-19 DIAGNOSIS — F329 Major depressive disorder, single episode, unspecified: Secondary | ICD-10-CM | POA: Diagnosis present

## 2013-10-19 DIAGNOSIS — F411 Generalized anxiety disorder: Secondary | ICD-10-CM | POA: Diagnosis present

## 2013-10-19 DIAGNOSIS — Z6835 Body mass index (BMI) 35.0-35.9, adult: Secondary | ICD-10-CM | POA: Diagnosis not present

## 2013-10-19 DIAGNOSIS — F3289 Other specified depressive episodes: Secondary | ICD-10-CM | POA: Diagnosis present

## 2013-10-19 DIAGNOSIS — E875 Hyperkalemia: Secondary | ICD-10-CM | POA: Diagnosis present

## 2013-10-19 DIAGNOSIS — A419 Sepsis, unspecified organism: Secondary | ICD-10-CM | POA: Diagnosis present

## 2013-10-19 DIAGNOSIS — R0602 Shortness of breath: Secondary | ICD-10-CM | POA: Diagnosis present

## 2013-10-19 DIAGNOSIS — L8990 Pressure ulcer of unspecified site, unspecified stage: Secondary | ICD-10-CM | POA: Diagnosis present

## 2013-10-19 DIAGNOSIS — I509 Heart failure, unspecified: Secondary | ICD-10-CM | POA: Diagnosis present

## 2013-10-19 DIAGNOSIS — K219 Gastro-esophageal reflux disease without esophagitis: Secondary | ICD-10-CM | POA: Diagnosis present

## 2013-10-19 DIAGNOSIS — Z87891 Personal history of nicotine dependence: Secondary | ICD-10-CM

## 2013-10-19 DIAGNOSIS — J9620 Acute and chronic respiratory failure, unspecified whether with hypoxia or hypercapnia: Secondary | ICD-10-CM | POA: Diagnosis present

## 2013-10-19 DIAGNOSIS — Z9981 Dependence on supplemental oxygen: Secondary | ICD-10-CM | POA: Diagnosis not present

## 2013-10-19 DIAGNOSIS — L8995 Pressure ulcer of unspecified site, unstageable: Secondary | ICD-10-CM | POA: Diagnosis present

## 2013-10-19 DIAGNOSIS — J9601 Acute respiratory failure with hypoxia: Secondary | ICD-10-CM

## 2013-10-19 DIAGNOSIS — I10 Essential (primary) hypertension: Secondary | ICD-10-CM

## 2013-10-19 DIAGNOSIS — T83511A Infection and inflammatory reaction due to indwelling urethral catheter, initial encounter: Secondary | ICD-10-CM | POA: Diagnosis present

## 2013-10-19 DIAGNOSIS — F209 Schizophrenia, unspecified: Secondary | ICD-10-CM | POA: Diagnosis present

## 2013-10-19 DIAGNOSIS — Z87442 Personal history of urinary calculi: Secondary | ICD-10-CM | POA: Diagnosis not present

## 2013-10-19 DIAGNOSIS — IMO0002 Reserved for concepts with insufficient information to code with codable children: Secondary | ICD-10-CM | POA: Diagnosis not present

## 2013-10-19 DIAGNOSIS — J189 Pneumonia, unspecified organism: Secondary | ICD-10-CM | POA: Diagnosis present

## 2013-10-19 DIAGNOSIS — Z79899 Other long term (current) drug therapy: Secondary | ICD-10-CM | POA: Diagnosis not present

## 2013-10-19 DIAGNOSIS — K5901 Slow transit constipation: Secondary | ICD-10-CM

## 2013-10-19 DIAGNOSIS — Z7401 Bed confinement status: Secondary | ICD-10-CM

## 2013-10-19 DIAGNOSIS — J9600 Acute respiratory failure, unspecified whether with hypoxia or hypercapnia: Secondary | ICD-10-CM

## 2013-10-19 DIAGNOSIS — N319 Neuromuscular dysfunction of bladder, unspecified: Secondary | ICD-10-CM | POA: Diagnosis present

## 2013-10-19 DIAGNOSIS — Y846 Urinary catheterization as the cause of abnormal reaction of the patient, or of later complication, without mention of misadventure at the time of the procedure: Secondary | ICD-10-CM | POA: Diagnosis present

## 2013-10-19 DIAGNOSIS — L89309 Pressure ulcer of unspecified buttock, unspecified stage: Secondary | ICD-10-CM | POA: Diagnosis present

## 2013-10-19 DIAGNOSIS — N179 Acute kidney failure, unspecified: Secondary | ICD-10-CM

## 2013-10-19 DIAGNOSIS — I5030 Unspecified diastolic (congestive) heart failure: Secondary | ICD-10-CM | POA: Diagnosis present

## 2013-10-19 DIAGNOSIS — K5900 Constipation, unspecified: Secondary | ICD-10-CM | POA: Diagnosis present

## 2013-10-19 DIAGNOSIS — R1310 Dysphagia, unspecified: Secondary | ICD-10-CM | POA: Diagnosis present

## 2013-10-19 DIAGNOSIS — G8929 Other chronic pain: Secondary | ICD-10-CM | POA: Diagnosis present

## 2013-10-19 DIAGNOSIS — J9611 Chronic respiratory failure with hypoxia: Secondary | ICD-10-CM

## 2013-10-19 DIAGNOSIS — J988 Other specified respiratory disorders: Secondary | ICD-10-CM | POA: Diagnosis present

## 2013-10-19 DIAGNOSIS — Z87440 Personal history of urinary (tract) infections: Secondary | ICD-10-CM

## 2013-10-19 DIAGNOSIS — G8250 Quadriplegia, unspecified: Secondary | ICD-10-CM | POA: Diagnosis present

## 2013-10-19 DIAGNOSIS — Z431 Encounter for attention to gastrostomy: Secondary | ICD-10-CM

## 2013-10-19 DIAGNOSIS — Z7982 Long term (current) use of aspirin: Secondary | ICD-10-CM

## 2013-10-19 DIAGNOSIS — Z91041 Radiographic dye allergy status: Secondary | ICD-10-CM

## 2013-10-19 DIAGNOSIS — R0902 Hypoxemia: Secondary | ICD-10-CM | POA: Insufficient documentation

## 2013-10-19 DIAGNOSIS — N390 Urinary tract infection, site not specified: Secondary | ICD-10-CM

## 2013-10-19 DIAGNOSIS — Z86718 Personal history of other venous thrombosis and embolism: Secondary | ICD-10-CM | POA: Diagnosis not present

## 2013-10-19 DIAGNOSIS — J9621 Acute and chronic respiratory failure with hypoxia: Secondary | ICD-10-CM

## 2013-10-19 DIAGNOSIS — D638 Anemia in other chronic diseases classified elsewhere: Secondary | ICD-10-CM

## 2013-10-19 DIAGNOSIS — K5909 Other constipation: Secondary | ICD-10-CM

## 2013-10-19 LAB — URINALYSIS, ROUTINE W REFLEX MICROSCOPIC
Bilirubin Urine: NEGATIVE
Glucose, UA: NEGATIVE mg/dL
Ketones, ur: NEGATIVE mg/dL
NITRITE: POSITIVE — AB
Protein, ur: NEGATIVE mg/dL
SPECIFIC GRAVITY, URINE: 1.006 (ref 1.005–1.030)
UROBILINOGEN UA: 0.2 mg/dL (ref 0.0–1.0)
pH: 7.5 (ref 5.0–8.0)

## 2013-10-19 LAB — CBC WITH DIFFERENTIAL/PLATELET
Basophils Absolute: 0 K/uL (ref 0.0–0.1)
Basophils Relative: 0 % (ref 0–1)
Eosinophils Absolute: 0.2 K/uL (ref 0.0–0.7)
Eosinophils Relative: 2 % (ref 0–5)
HCT: 32.6 % — ABNORMAL LOW (ref 36.0–46.0)
Hemoglobin: 9.3 g/dL — ABNORMAL LOW (ref 12.0–15.0)
Lymphocytes Relative: 23 % (ref 12–46)
Lymphs Abs: 2.3 K/uL (ref 0.7–4.0)
MCH: 21.8 pg — ABNORMAL LOW (ref 26.0–34.0)
MCHC: 28.5 g/dL — ABNORMAL LOW (ref 30.0–36.0)
MCV: 76.5 fL — ABNORMAL LOW (ref 78.0–100.0)
Monocytes Absolute: 1.3 K/uL — ABNORMAL HIGH (ref 0.1–1.0)
Monocytes Relative: 13 % — ABNORMAL HIGH (ref 3–12)
Neutro Abs: 6.4 K/uL (ref 1.7–7.7)
Neutrophils Relative %: 62 % (ref 43–77)
Platelets: 234 K/uL (ref 150–400)
RBC: 4.26 MIL/uL (ref 3.87–5.11)
RDW: 13.9 % (ref 11.5–15.5)
WBC: 10.2 K/uL (ref 4.0–10.5)

## 2013-10-19 LAB — I-STAT CG4 LACTIC ACID, ED: Lactic Acid, Venous: 0.74 mmol/L (ref 0.5–2.2)

## 2013-10-19 LAB — I-STAT CHEM 8, ED
BUN: 21 mg/dL (ref 6–23)
Calcium, Ion: 1.08 mmol/L — ABNORMAL LOW (ref 1.12–1.23)
Chloride: 92 mEq/L — ABNORMAL LOW (ref 96–112)
Creatinine, Ser: 1.4 mg/dL — ABNORMAL HIGH (ref 0.50–1.10)
Glucose, Bld: 81 mg/dL (ref 70–99)
HCT: 33 % — ABNORMAL LOW (ref 36.0–46.0)
HEMOGLOBIN: 11.2 g/dL — AB (ref 12.0–15.0)
Potassium: 3.8 mEq/L (ref 3.7–5.3)
SODIUM: 133 meq/L — AB (ref 137–147)
TCO2: 38 mmol/L (ref 0–100)

## 2013-10-19 LAB — URINE MICROSCOPIC-ADD ON

## 2013-10-19 LAB — MRSA PCR SCREENING: MRSA by PCR: NEGATIVE

## 2013-10-19 MED ORDER — GUAIFENESIN 100 MG/5ML PO SOLN
15.0000 mL | Freq: Three times a day (TID) | ORAL | Status: DC
  Administered 2013-10-19 – 2013-10-25 (×16): 300 mg via ORAL
  Filled 2013-10-19: qty 20
  Filled 2013-10-19: qty 10
  Filled 2013-10-19: qty 20
  Filled 2013-10-19: qty 10
  Filled 2013-10-19 (×4): qty 20
  Filled 2013-10-19 (×2): qty 10
  Filled 2013-10-19: qty 20
  Filled 2013-10-19: qty 10
  Filled 2013-10-19 (×2): qty 20
  Filled 2013-10-19: qty 10
  Filled 2013-10-19 (×3): qty 20

## 2013-10-19 MED ORDER — ONDANSETRON HCL 4 MG PO TABS
4.0000 mg | ORAL_TABLET | Freq: Four times a day (QID) | ORAL | Status: DC | PRN
Start: 2013-10-19 — End: 2013-10-28

## 2013-10-19 MED ORDER — ASPIRIN 325 MG PO TABS
325.0000 mg | ORAL_TABLET | Freq: Every day | ORAL | Status: DC
  Administered 2013-10-20 – 2013-10-28 (×9): 325 mg via ORAL
  Filled 2013-10-19 (×9): qty 1

## 2013-10-19 MED ORDER — HYDROCODONE-ACETAMINOPHEN 5-325 MG PO TABS
1.0000 | ORAL_TABLET | ORAL | Status: DC | PRN
  Administered 2013-10-21 (×2): 1 via ORAL
  Administered 2013-10-24: 2 via ORAL
  Filled 2013-10-19: qty 2
  Filled 2013-10-19 (×2): qty 1

## 2013-10-19 MED ORDER — BISACODYL 10 MG RE SUPP: 10.0000 mg | RECTAL | Status: DC | PRN

## 2013-10-19 MED ORDER — ENOXAPARIN SODIUM 30 MG/0.3ML ~~LOC~~ SOLN: 30.0000 mg | SUBCUTANEOUS | Status: DC

## 2013-10-19 MED ORDER — ONDANSETRON HCL 4 MG/2ML IJ SOLN
4.0000 mg | Freq: Four times a day (QID) | INTRAMUSCULAR | Status: DC | PRN
  Administered 2013-10-22 – 2013-10-26 (×5): 4 mg via INTRAVENOUS
  Filled 2013-10-19 (×5): qty 2

## 2013-10-19 MED ORDER — MAGNESIUM HYDROXIDE 400 MG/5ML PO SUSP: 30.0000 mL | ORAL | Status: DC | PRN

## 2013-10-19 MED ORDER — POLYVINYL ALCOHOL 1.4 % OP SOLN
1.0000 [drp] | Freq: Four times a day (QID) | OPHTHALMIC | Status: DC
  Administered 2013-10-19 – 2013-10-28 (×31): 1 [drp] via OPHTHALMIC
  Filled 2013-10-19 (×2): qty 15

## 2013-10-19 MED ORDER — METOPROLOL TARTRATE 25 MG PO TABS: 50.0000 mg | ORAL_TABLET | Freq: Two times a day (BID) | ORAL | Status: DC

## 2013-10-19 MED ORDER — SODIUM CHLORIDE 0.9 % IV BOLUS (SEPSIS)
1000.0000 mL | Freq: Once | INTRAVENOUS | Status: AC
  Administered 2013-10-19: 1000 mL via INTRAVENOUS

## 2013-10-19 MED ORDER — ACETAMINOPHEN 325 MG PO TABS
650.0000 mg | ORAL_TABLET | Freq: Four times a day (QID) | ORAL | Status: DC | PRN
  Administered 2013-10-20: 650 mg via ORAL
  Filled 2013-10-19 (×2): qty 2

## 2013-10-19 MED ORDER — IPRATROPIUM BROMIDE 0.02 % IN SOLN: 500.0000 ug | Freq: Four times a day (QID) | RESPIRATORY_TRACT | Status: DC

## 2013-10-19 MED ORDER — ZIPRASIDONE HCL 40 MG PO CAPS
40.0000 mg | ORAL_CAPSULE | Freq: Every day | ORAL | Status: DC
  Administered 2013-10-20 – 2013-10-28 (×8): 40 mg via ORAL
  Filled 2013-10-19 (×12): qty 1

## 2013-10-19 MED ORDER — HYDROCODONE-ACETAMINOPHEN 7.5-325 MG/15ML PO SOLN
10.0000 mL | Freq: Once | ORAL | Status: AC
  Administered 2013-10-19: 10 mL via ORAL
  Filled 2013-10-19: qty 15

## 2013-10-19 MED ORDER — DOCUSATE SODIUM 100 MG PO CAPS
100.0000 mg | ORAL_CAPSULE | Freq: Two times a day (BID) | ORAL | Status: DC
  Administered 2013-10-19 – 2013-10-20 (×2): 100 mg via ORAL
  Filled 2013-10-19 (×2): qty 1

## 2013-10-19 MED ORDER — SODIUM CHLORIDE 0.9 % IJ SOLN
3.0000 mL | Freq: Two times a day (BID) | INTRAMUSCULAR | Status: DC
  Administered 2013-10-21 – 2013-10-23 (×2): 3 mL via INTRAVENOUS

## 2013-10-19 MED ORDER — OXYCODONE HCL 5 MG PO TABS
15.0000 mg | ORAL_TABLET | ORAL | Status: DC | PRN
  Administered 2013-10-20 – 2013-10-28 (×11): 15 mg via ORAL
  Filled 2013-10-19 (×11): qty 3

## 2013-10-19 MED ORDER — POLYETHYLENE GLYCOL 3350 17 G PO PACK
17.0000 g | PACK | Freq: Every day | ORAL | Status: DC
  Administered 2013-10-21: 17 g via ORAL
  Filled 2013-10-19 (×2): qty 1

## 2013-10-19 MED ORDER — ACETAMINOPHEN 650 MG RE SUPP
650.0000 mg | Freq: Four times a day (QID) | RECTAL | Status: DC | PRN
Start: 2013-10-19 — End: 2013-10-28

## 2013-10-19 MED ORDER — SUCRALFATE 1 G PO TABS
1.0000 g | ORAL_TABLET | Freq: Three times a day (TID) | ORAL | Status: DC
  Administered 2013-10-19 – 2013-10-28 (×33): 1 g via ORAL
  Filled 2013-10-19 (×36): qty 1

## 2013-10-19 MED ORDER — DEXTROSE 5 % IV SOLN
1.0000 g | Freq: Every day | INTRAVENOUS | Status: DC
  Administered 2013-10-19: 1 g via INTRAVENOUS
  Filled 2013-10-19: qty 10

## 2013-10-19 MED ORDER — DOCUSATE SODIUM 100 MG PO CAPS: 100.0000 mg | ORAL_CAPSULE | Freq: Two times a day (BID) | ORAL | Status: DC

## 2013-10-19 MED ORDER — ALBUTEROL SULFATE (2.5 MG/3ML) 0.083% IN NEBU: 2.5000 mg | INHALATION_SOLUTION | Freq: Four times a day (QID) | RESPIRATORY_TRACT | Status: DC

## 2013-10-19 MED ORDER — HYDROCODONE-ACETAMINOPHEN 5-325 MG PO TABS
1.0000 | ORAL_TABLET | ORAL | Status: DC | PRN
Start: 2013-10-19 — End: 2013-10-19

## 2013-10-19 MED ORDER — ZIPRASIDONE HCL 80 MG PO CAPS
80.0000 mg | ORAL_CAPSULE | Freq: Two times a day (BID) | ORAL | Status: DC
  Administered 2013-10-19 – 2013-10-28 (×18): 80 mg via ORAL
  Filled 2013-10-19 (×22): qty 1

## 2013-10-19 MED ORDER — VENLAFAXINE HCL 50 MG PO TABS
100.0000 mg | ORAL_TABLET | Freq: Two times a day (BID) | ORAL | Status: DC
  Administered 2013-10-19 – 2013-10-28 (×18): 100 mg via ORAL
  Filled 2013-10-19 (×22): qty 2

## 2013-10-19 MED ORDER — ENOXAPARIN SODIUM 40 MG/0.4ML ~~LOC~~ SOLN
40.0000 mg | Freq: Every day | SUBCUTANEOUS | Status: DC
  Administered 2013-10-19 – 2013-10-27 (×9): 40 mg via SUBCUTANEOUS
  Filled 2013-10-19 (×13): qty 0.4

## 2013-10-19 MED ORDER — IPRATROPIUM-ALBUTEROL 0.5-2.5 (3) MG/3ML IN SOLN
3.0000 mL | Freq: Four times a day (QID) | RESPIRATORY_TRACT | Status: DC
  Administered 2013-10-20 – 2013-10-28 (×33): 3 mL via RESPIRATORY_TRACT
  Filled 2013-10-19 (×35): qty 3

## 2013-10-19 NOTE — ED Notes (Signed)
Pt's sister Annice Pih, [place], wants to be called if pt is admitted.

## 2013-10-19 NOTE — ED Notes (Signed)
Per EMS: Pt from [facility], c/o PEG not flushing appropriately. Pt also c/o abd pain, no n/v.

## 2013-10-19 NOTE — H&P (Signed)
PCP:   Terald Sleeper, MD    Chief Complaint:  shortness of breath  HPI: [name] is a 48 y.o. female   has a past medical history of Respiratory failure; Dysphagia; Contracture of hand joint; Anemia; Neurogenic bladder; Anxiety; Schizophrenia; Quadriplegia; Hypertension; CHF (congestive heart failure); Anginal pain; Right leg DVT (1980's); Pneumonia; History of blood transfusion; GERD (gastroesophageal reflux disease); Arthritis; Depression; Kidney stones; and On home oxygen therapy.   Presented with  Patient had been having intermittent complaints of shortness of breath while residing at a nursing home. Today she was brought in to [facility] department because of clogged feeding tube. It would not flush. ER physician was unable to replace it or de clog it. 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 Describes chronic abdominal pain to her abdomen. Patient had had in the past recurrent urinary tract infections. UA was significant for UTI chest x-ray was worrisome for persistent infiltrate. Patient initially was noted to be hypotensive with blood pressures down 69/30 but after IV fluids it improved her blood pressure 145/70. Initial oxygen saturation was down to 82% the patient's arrival. In the past it was thought that maybe she is not appropriately ventilating due to body habitus  Hospitalist was called for admission for hypoxia transient hypotension and UTI  Review of Systems:    Pertinent positives include: Fevers, chills,, abdominal pain,   Constitutional:  No weight loss, night sweats,  fatigue, weight loss  HEENT:  No headaches, Difficulty swallowing,Tooth/dental problems,Sore throat,  No sneezing, itching, ear ache, nasal congestion, post nasal drip,  Cardio-vascular:  No chest pain, Orthopnea, PND, anasarca, dizziness, palpitations.no Bilateral lower extremity swelling  GI:    No heartburn, indigestionnausea, vomiting, diarrhea, change in bowel habits, loss of appetite, melena, blood in stool, hematemesis Resp:  no shortness of breath at rest. No dyspnea on exertion, No excess mucus, no productive cough, No non-productive cough, No coughing up of blood.No change in color of mucus.No wheezing. Skin:  no rash or lesions. No jaundice GU:  no dysuria, change in color of urine, no urgency or frequency. No straining to urinate.  No flank pain.  Musculoskeletal:  No joint pain or no joint swelling. No decreased range of motion. No back pain.  Psych:  No change in mood or affect. No depression or anxiety. No memory loss.  Neuro: no localizing neurological complaints, no tingling, no weakness, no double vision, no gait abnormality, no slurred speech, no confusion  Otherwise ROS are negative except for above, 10 systems were reviewed  Past Medical History: Past Medical History  Diagnosis Date  . Respiratory failure   . Dysphagia   . Contracture of hand joint   . Anemia   . Neurogenic bladder   . Anxiety   . Schizophrenia   . Quadriplegia   . Hypertension   . CHF (congestive heart failure)   . Anginal pain   . Right leg DVT 1980's  . Pneumonia     "today; I get it alot" (07/21/2012)  . History of blood transfusion     "I've had a few" (07/21/2012)  . GERD (gastroesophageal reflux disease)   . Arthritis     "in my hands" (07/21/2012)  . Depression   . Kidney stones     "about 4; 2 different times" (6/92014)  . On home oxygen therapy     "38% q hs" (07/21/2012)   Past Surgical History  Procedure  Laterality Date  . Tracheostomy  2011  . Gastrostomy w/ feeding tube    . Tubal ligation  1996  . Cystoscopy/retrograde/ureteroscopy/stone extraction with basket    . Lithotripsy    . Peg placement       Medications: Prior to Admission medications   Medication Sig Start Date End Date Taking? Authorizing Provider  acetaminophen (TYLENOL) 500 MG tablet Take  1,000 mg by mouth 3 (three) times daily.   Yes Historical Provider, MD  albuterol (PROVENTIL) (2.5 MG/3ML) 0.083% nebulizer solution Take 2.5 mg by nebulization every 6 (six) hours.   Yes Historical Provider, MD  amLODipine (NORVASC) 5 MG tablet Take 5 mg by mouth daily.   Yes Historical Provider, MD  aspirin 325 MG tablet Take 325 mg by mouth daily.   Yes Historical Provider, MD  bisacodyl (DULCOLAX) 10 MG suppository Place 10 mg rectally every other day as needed for constipation.   Yes Historical Provider, MD  calcium-vitamin D (OSCAL 500/200 D-3) 500-200 MG-UNIT per tablet Take 1 tablet by mouth every morning.   Yes Historical Provider, MD  docusate sodium (COLACE) 100 MG capsule Take 100 mg by mouth 2 (two) times daily.   Yes Historical Provider, MD  furosemide (LASIX) 20 MG tablet Take 20 mg by mouth daily.   Yes Historical Provider, MD  guaiFENesin (ROBITUSSIN) 100 MG/5ML SOLN Take 15 mLs by mouth every 8 (eight) hours. scheduled   Yes Historical Provider, MD  HYDROcodone-acetaminophen (NORCO/VICODIN) 5-325 MG per tablet Take one tablet by mouth every 4 hours as needed for pain 10/02/13  Yes Claudie Revering, NP  ipratropium (ATROVENT) 0.02 % nebulizer solution Take 500 mcg by nebulization every 6 (six) hours.   Yes Historical Provider, MD  magnesium hydroxide (MILK OF MAGNESIA) 400 MG/5ML suspension Take 30 mLs by mouth every other day as needed for mild constipation.   Yes Historical Provider, MD  metoprolol tartrate (LOPRESSOR) 25 MG tablet Take 50 mg by mouth 2 (two) times daily.    Yes Historical Provider, MD  oxyCODONE (ROXICODONE) 15 MG immediate release tablet Take one tablet by mouth twice daily after breakfast and at 2pm; Take one tablet by mouth every 4 hours as needed for pain 09/17/13  Yes Claudie Revering, NP  pantoprazole sodium (PROTONIX) 40 mg/20 mL PACK Take 40 mg by mouth daily.   Yes Historical Provider, MD  polyethylene glycol (MIRALAX / GLYCOLAX) packet Take 17 g by mouth  daily. constipation   Yes Historical Provider, MD  Polyvinyl Alcohol-Povidone (FRESHKOTE) 2.7-2 % SOLN Place 1 drop into both eyes 4 (four) times daily.   Yes Historical Provider, MD  sucralfate (CARAFATE) 1 G tablet Take 1 tablet (1 g total) by mouth 4 (four) times daily -  with meals and at bedtime. 03/14/12  Yes [name], MD  venlafaxine (EFFEXOR) 100 MG tablet Take 100 mg by mouth 2 (two) times daily.   Yes Historical Provider, MD  ziprasidone (GEODON) 40 MG capsule Take 40 mg by mouth daily at 12 noon.   Yes Historical Provider, MD  ziprasidone (GEODON) 80 MG capsule Take 80 mg by mouth 2 (two) times daily with a meal.   Yes Historical Provider, MD    Allergies:   Allergies  Allergen Reactions  . Latex Other (See Comments)    Reaction unknown    Social History:  Ambulatory  bed bound From facility [facility]   reports that she quit smoking about 6 years ago. Her smoking use included Cigarettes. She  has a 8 pack-year smoking history. She has never used smokeless tobacco. She reports that she does not drink alcohol or use illicit drugs.    Family History: family history is not on file.    Physical Exam: Patient Vitals for the past 24 hrs:  BP Temp Temp src Pulse Resp SpO2 Height Weight  10/19/13 2200 145/70 mmHg 99.1 F (37.3 C) Oral - - 99 %  (1.575 m) 87.7 kg (193 lb 5.5 oz)  10/19/13 2026 - - - 96 20 100 % - -  10/19/13 2000 132/69 mmHg - - 95 - 100 % - -  10/19/13 1945 138/72 mmHg - - 91 - 100 % - -  10/19/13 1930 120/72 mmHg - - 85 - 100 % - -  10/19/13 1915 102/62 mmHg - - 88 - 100 % - -  10/19/13 1913 92/58 mmHg - - 82 - 100 % - -  10/19/13 1908 69/30 mmHg - - 63 - 93 % - -  10/19/13 1838 - - - - - 91 % - -  10/19/13 1821 - - - - - 99 % - -  10/19/13 1820 113/58 mmHg - - 93 - 97 % - -  10/19/13 1800 130/70 mmHg - - 84 - 99 % - -  10/19/13 1756 116/58 mmHg - - - - 100 % - -  10/19/13 1740 116/58 mmHg - - 81 - 97 % - -  10/19/13 1736 113/75 mmHg - - -  - - - -  10/19/13 1723 - 96.9 F (36.1 C) Rectal - - - - -  10/19/13 1655 99/59 mmHg - - - - - - -  10/19/13 1543 80/42 mmHg - - 72 - 97 % - -  10/19/13 1521 90/55 mmHg - - - 16 - - -  10/19/13 1509 90/55 mmHg 97.8 F (36.6 C) - 79 16 82 % - -    1. General:  in No Acute distress 2. Psychological: Alert and Oriented 3. Head/ENT:   MoistMucous Membranes                          Head Non traumatic, neck supple                          Normal  Dentition 4. SKIN: normal  Skin turgor,  Skin clean Dry and intact no rash 5. Heart: Regular rate and rhythm no Murmur, Rub or gallop 6. Lungs: Clear to auscultation bilaterally, no wheezes or crackles   7. Abdomen: Soft, non-tender, Non distended abdomen is curved to the left PEG tube in place 8. Lower extremities: no clubbing, cyanosis, or edema 9. Neurologically chronic contractures of upper and lower extremities 10. MSK: Normal range of motion  body mass index is 35.35 kg/(m^2).   Labs on Admission:   Recent Labs  10/19/13 1634  NA 133*  K 3.8  CL 92*  GLUCOSE 81  BUN 21  CREATININE 1.40*   No results found for this basename: AST, ALT, ALKPHOS, BILITOT, PROT, ALBUMIN,  in the last 72 hours No results found for this basename: LIPASE, AMYLASE,  in the last 72 hours  Recent Labs  10/19/13 1622 10/19/13 1634  WBC 10.2  --   NEUTROABS 6.4  --   HGB 9.3* 11.2*  HCT 32.6* 33.0*  MCV 76.5*  --   PLT 234  --    No results found for this  basename: CKTOTAL, CKMB, CKMBINDEX, TROPONINI,  in the last 72 hours No results found for this basename: TSH, T4TOTAL, FREET3, T3FREE, THYROIDAB,  in the last 72 hours No results found for this basename: VITAMINB12, FOLATE, FERRITIN, TIBC, IRON, RETICCTPCT,  in the last 72 hours Lab Results  Component Value Date   HGBA1C  Value: 5.6 (NOTE)                                                                       According to the ADA Clinical Practice Recommendations for 2011, when HbA1c is used as a  screening test:   >=6.5%   Diagnostic of Diabetes Mellitus           (if abnormal result  is confirmed)  5.7-6.4%   Increased risk of developing Diabetes Mellitus  References:Diagnosis and Classification of Diabetes Mellitus,Diabetes Care,2011,34(Suppl 1):S62-S69 and Standards of Medical Care in         Diabetes - 2011,Diabetes Care,2011,34  (Suppl 1):S11-S61. 06/28/2010    Estimated Creatinine Clearance: 50.5 ml/min (by C-G formula based on Cr of 1.4). ABG    Component Value Date/Time   PHART 7.351 11/16/2011 2240   HCO3 34.7* 11/16/2011 2240   TCO2 38 10/19/2013 1634   ACIDBASEDEF 1.0 11/04/2010 0427   O2SAT 98.0 11/16/2011 2240     Lab Results  Component Value Date   DDIMER 1.77* 08/28/2011     Other results:   UA evidence of UTI  BNP (last 3 results) No results found for this basename: PROBNP,  in the last 8760 hours  Filed Weights   10/19/13 2200  Weight: 87.7 kg (193 lb 5.5 oz)     Cultures:    Component Value Date/Time   SDES URINE, CATHETERIZED 10/04/2012 0310   SPECREQUEST NONE 10/04/2012 0310   CULT  Value: Multiple bacterial morphotypes present, none predominant. Suggest appropriate recollection if clinically indicated. Performed at [facility] 10/04/2012 0310   REPTSTATUS 10/05/2012 FINAL 10/04/2012 0310     Radiological Exams on Admission: Dg Abd Acute W/chest  10/19/2013   CLINICAL DATA:  Abdominal pain, quadriplegic  EXAM: ACUTE ABDOMEN SERIES (ABDOMEN 2 VIEW & CHEST 1 VIEW)  COMPARISON:  [facility]vis [facility]  FINDINGS: There is no evidence of dilated bowel loops or free intraperitoneal air. No radiopaque calculi or other significant radiographic abnormality is seen. Heart size is moderately enlarged, unchanged. Stable left lower lobe opacity is again identified. The spinal stimulator projects over the right abdomen. Tracheostomy tube is noted. Cervical fusion hardware partly visualized. Left-sided Port-A-Cath terminates over the  mid SVC. The patient could not be appropriately positioned due to immobility. Left hip degenerative change again noted. Feeding tube in place.  IMPRESSION: Nonobstructive bowel gas pattern.  Persistent left lower lobe consolidation, possibly atelectasis although pneumonia could appear similar.   Electronically Signed   By: Christiana Pellant M.D.   On: 10/19/2013 17:37    Chart has been reviewed  Assessment/Plan  48 year old female with history of quadriplegia here with transient hypoxemia and shortness of breath also evidence of UTI.  Present on Admission:  . UTI (urinary tract infection)- covered with IV antibiotics await results of urine culture  . Quadriplegia - chronic stable wound care consult for  history of decubitus ulcer  . Hypoxia -  I attempted to obtain CT angiogram to evaluate for PE and is chronically bed bound patient. Unfortunately no axis was able to be obtained due to the patient's contractures and port noncompatible with contrast VQ scan order for 2 more on Questionable infiltrate patient is not endorse cough on review of records this is chronic finding. Repeat chest x-ray could be done in order to clarify this  Prophylaxis: Lovenox, Protonix  CODE STATUS:  FULL CODE   Other plan as per orders.  I have spent a total of 55 min on this admission  Ragnar Waas 10/19/2013, 11:04 PM  [facility]  Pager [phone]   If 7AM-7PM, please contact the day team taking care of the patient  [email or web address]  Password [email or web address]

## 2013-10-19 NOTE — ED Notes (Signed)
Tried to flush PEG tube with coke. No success. Tube completely blocked.

## 2013-10-19 NOTE — ED Provider Notes (Signed)
CSN: [id number]     Arrival date & time 10/19/13  1504 History   First MD Initiated Contact with Patient 10/19/13 1523     Chief Complaint  Patient presents with  . PEG tube problem      (Consider location/radiation/quality/duration/timing/severity/associated sxs/prior Treatment) The history is provided by the patient.   patient presents with clogged up feeding tube from the nursing home. Reportedly would not flush and then was sent in to the hospital. She also later began to complain of shortness of breath and lower abdominal pain. No fevers. She has a chronic catheter and chronic trach. She is on oxygen at the nursing home. She states she's had low-grade fevers. She states the pain is in her lower abdomen. It is dull. She's had some diarrhea. She quadriplegic from previous spinal cord injury.  Past Medical History  Diagnosis Date  . Respiratory failure   . Dysphagia   . Contracture of hand joint   . Anemia   . Neurogenic bladder   . Anxiety   . Schizophrenia   . Quadriplegia   . Hypertension   . CHF (congestive heart failure)   . Anginal pain   . Right leg DVT 1980's  . Pneumonia     "today; I get it alot" (07/21/2012)  . History of blood transfusion     "I've had a few" (07/21/2012)  . GERD (gastroesophageal reflux disease)   . Arthritis     "in my hands" (07/21/2012)  . Depression   . Kidney stones     "about 4; 2 different times" (6/92014)  . On home oxygen therapy     "38% q hs" (07/21/2012)   Past Surgical History  Procedure Laterality Date  . Tracheostomy  2011  . Gastrostomy w/ feeding tube    . Tubal ligation  1996  . Cystoscopy/retrograde/ureteroscopy/stone extraction with basket    . Lithotripsy    . Peg placement     No family history on file. History  Substance Use Topics  . Smoking status: Former Smoker -- 2.00 packs/day for 4 years    Types: Cigarettes    Quit date: 11/17/2006  . Smokeless tobacco: Never Used  . Alcohol Use: No   OB History   Grav  Para Term Preterm Abortions TAB SAB Ect Mult Living                 Review of Systems  Constitutional: Positive for fever. Negative for chills.  Respiratory: Positive for shortness of breath.   Gastrointestinal: Positive for abdominal pain and diarrhea.  Genitourinary: Negative for flank pain.  Musculoskeletal: Negative for back pain and joint swelling.  Skin: Negative for wound.      Allergies  Latex  Home Medications   Prior to Admission medications   Medication Sig Start Date End Date Taking? Authorizing Provider  acetaminophen (TYLENOL) 500 MG tablet Take 1,000 mg by mouth 3 (three) times daily.   Yes Historical Provider, MD  albuterol (PROVENTIL) (2.5 MG/3ML) 0.083% nebulizer solution Take 2.5 mg by nebulization every 6 (six) hours.   Yes Historical Provider, MD  amLODipine (NORVASC) 5 MG tablet Take 5 mg by mouth daily.   Yes Historical Provider, MD  aspirin 325 MG tablet Take 325 mg by mouth daily.   Yes Historical Provider, MD  bisacodyl (DULCOLAX) 10 MG suppository Place 10 mg rectally every other day as needed for constipation.   Yes Historical Provider, MD  calcium-vitamin D (OSCAL 500/200 D-3) 500-200 MG-UNIT per tablet Take 1  tablet by mouth every morning.   Yes Historical Provider, MD  docusate sodium (COLACE) 100 MG capsule Take 100 mg by mouth 2 (two) times daily.   Yes Historical Provider, MD  furosemide (LASIX) 20 MG tablet Take 20 mg by mouth daily.   Yes Historical Provider, MD  guaiFENesin (ROBITUSSIN) 100 MG/5ML SOLN Take 15 mLs by mouth every 8 (eight) hours. scheduled   Yes Historical Provider, MD  HYDROcodone-acetaminophen (NORCO/VICODIN) 5-325 MG per tablet Take one tablet by mouth every 4 hours as needed for pain 10/02/13  Yes Claudie Revering, NP  ipratropium (ATROVENT) 0.02 % nebulizer solution Take 500 mcg by nebulization every 6 (six) hours.   Yes Historical Provider, MD  magnesium hydroxide (MILK OF MAGNESIA) 400 MG/5ML suspension Take 30 mLs by mouth  every other day as needed for mild constipation.   Yes Historical Provider, MD  metoprolol tartrate (LOPRESSOR) 25 MG tablet Take 50 mg by mouth 2 (two) times daily.    Yes Historical Provider, MD  oxyCODONE (ROXICODONE) 15 MG immediate release tablet Take one tablet by mouth twice daily after breakfast and at 2pm; Take one tablet by mouth every 4 hours as needed for pain 09/17/13  Yes Claudie Revering, NP  pantoprazole sodium (PROTONIX) 40 mg/20 mL PACK Take 40 mg by mouth daily.   Yes Historical Provider, MD  polyethylene glycol (MIRALAX / GLYCOLAX) packet Take 17 g by mouth daily. constipation   Yes Historical Provider, MD  Polyvinyl Alcohol-Povidone (FRESHKOTE) 2.7-2 % SOLN Place 1 drop into both eyes 4 (four) times daily.   Yes Historical Provider, MD  sucralfate (CARAFATE) 1 G tablet Take 1 tablet (1 g total) by mouth 4 (four) times daily -  with meals and at bedtime. 03/14/12  Yes [name], MD  venlafaxine (EFFEXOR) 100 MG tablet Take 100 mg by mouth 2 (two) times daily.   Yes Historical Provider, MD  ziprasidone (GEODON) 40 MG capsule Take 40 mg by mouth daily at 12 noon.   Yes Historical Provider, MD  ziprasidone (GEODON) 80 MG capsule Take 80 mg by mouth 2 (two) times daily with a meal.   Yes Historical Provider, MD   BP 90/38  Pulse 117  Temp(Src) 99.4 F (37.4 C) (Axillary)  Resp 18  Ht  (1.575 m)  Wt 193 lb 5.5 oz (87.7 kg)  BMI 35.35 kg/m2  SpO2 100% Physical Exam  Constitutional: She is oriented to person, place, and time. She appears well-developed.  HENT:  Head: Normocephalic.  Eyes: Pupils are equal, round, and reactive to light.  Neck: Neck supple.  Janina Mayo site is well-appearing  Cardiovascular: Normal rate and regular rhythm.   Pulmonary/Chest: Effort normal and breath sounds normal.  Abdominal: There is tenderness.  Mild lower abdominal tenderness without rebound or guarding. PEG tube in left upper abdomen. Foley catheter in place.  Musculoskeletal:  Chronic  atrophy of upper and lower extremities  Neurological: She is alert and oriented to person, place, and time.  Skin: Skin is warm.    ED Course  Procedures (including critical care time) Labs Review Labs Reviewed  URINALYSIS, ROUTINE W REFLEX MICROSCOPIC - Abnormal; Notable for the following:    APPearance TURBID (*)    Hgb urine dipstick MODERATE (*)    Nitrite POSITIVE (*)    Leukocytes, UA LARGE (*)    All other components within normal limits  CBC WITH DIFFERENTIAL - Abnormal; Notable for the following:    Hemoglobin 9.3 (*)  HCT 32.6 (*)    MCV 76.5 (*)    MCH 21.8 (*)    MCHC 28.5 (*)    Monocytes Relative 13 (*)    Monocytes Absolute 1.3 (*)    All other components within normal limits  URINE MICROSCOPIC-ADD ON - Abnormal; Notable for the following:    Bacteria, UA MANY (*)    All other components within normal limits  COMPREHENSIVE METABOLIC PANEL - Abnormal; Notable for the following:    Potassium 3.3 (*)    CO2 34 (*)    Albumin 2.0 (*)    Total Bilirubin <0.2 (*)    GFR calc non Af Amer 75 (*)    GFR calc Af Amer 87 (*)    All other components within normal limits  CBC - Abnormal; Notable for the following:    WBC 11.4 (*)    Hemoglobin 8.7 (*)    HCT 31.4 (*)    MCH 21.6 (*)    MCHC 27.7 (*)    All other components within normal limits  CREATININE, SERUM - Abnormal; Notable for the following:    GFR calc non Af Amer 71 (*)    GFR calc Af Amer 82 (*)    All other components within normal limits  I-STAT CHEM 8, ED - Abnormal; Notable for the following:    Sodium 133 (*)    Chloride 92 (*)    Creatinine, Ser 1.40 (*)    Calcium, Ion 1.08 (*)    Hemoglobin 11.2 (*)    HCT 33.0 (*)    All other components within normal limits  MRSA PCR SCREENING  URINE CULTURE  CULTURE, BLOOD (ROUTINE X 2)  CULTURE, BLOOD (ROUTINE X 2)  CULTURE, RESPIRATORY (NON-EXPECTORATED)  MAGNESIUM  PHOSPHORUS  TSH  PROCALCITONIN  TROPONIN I  TROPONIN I  TROPONIN I   TROPONIN I  PROCALCITONIN  I-STAT CG4 LACTIC ACID, ED    Imaging Review Dg Abd Acute W/chest  10/19/2013   CLINICAL DATA:  Abdominal pain, quadriplegic  EXAM: ACUTE ABDOMEN SERIES (ABDOMEN 2 VIEW & CHEST 1 VIEW)  COMPARISON:  CT abdomen[facility]pelvis [facility]09/2013 [facility]  FINDINGS: There is no evidence of dilated bowel loops or free intraperitoneal air. No radiopaque calculi or other significant radiographic abnormality is seen. Heart size is moderately enlarged, unchanged. Stable left lower lobe opacity is again identified. The spinal stimulator projects over the right abdomen. Tracheostomy tube is noted. Cervical fusion hardware partly visualized. Left-sided Port-A-Cath terminates over the mid SVC. The patient could not be appropriately positioned due to immobility. Left hip degenerative change again noted. Feeding tube in place.  IMPRESSION: Nonobstructive bowel gas pattern.  Persistent left lower lobe consolidation, possibly atelectasis although pneumonia could appear similar.   Electronically Signed   By: Christiana Pellant M.D.   On: 10/19/2013 17:37     EKG Interpretation None      MDM   Final diagnoses:  Hypoxia    Patient with difficulty with PEG tube. It is not ER replaceable. Also complaining of abdominal pain and hypoxia. Has a history of CHF and is on oxygen at night. Will admit to internal medicine. Has infiltrate on x-ray and possible infection and urine. Both of which appear to be somewhat chronic.    Juliet Rude. Rubin Payor, MD 10/20/13 2326

## 2013-10-19 NOTE — ED Notes (Signed)
Patient transported to X-ray 

## 2013-10-19 NOTE — ED Notes (Signed)
Bed: ZO10 Expected date:  Expected time:  Means of arrival:  Comments: EMS- Peg tube issue

## 2013-10-20 ENCOUNTER — Inpatient Hospital Stay (HOSPITAL_COMMUNITY): Payer: Medicare Other

## 2013-10-20 DIAGNOSIS — D638 Anemia in other chronic diseases classified elsewhere: Secondary | ICD-10-CM

## 2013-10-20 DIAGNOSIS — K5901 Slow transit constipation: Secondary | ICD-10-CM

## 2013-10-20 DIAGNOSIS — J9620 Acute and chronic respiratory failure, unspecified whether with hypoxia or hypercapnia: Secondary | ICD-10-CM | POA: Diagnosis present

## 2013-10-20 DIAGNOSIS — A419 Sepsis, unspecified organism: Secondary | ICD-10-CM | POA: Diagnosis present

## 2013-10-20 DIAGNOSIS — R0902 Hypoxemia: Secondary | ICD-10-CM

## 2013-10-20 DIAGNOSIS — I10 Essential (primary) hypertension: Secondary | ICD-10-CM

## 2013-10-20 DIAGNOSIS — N179 Acute kidney failure, unspecified: Secondary | ICD-10-CM

## 2013-10-20 DIAGNOSIS — J9610 Chronic respiratory failure, unspecified whether with hypoxia or hypercapnia: Secondary | ICD-10-CM

## 2013-10-20 LAB — CREATININE, SERUM
Creatinine, Ser: 0.94 mg/dL (ref 0.50–1.10)
GFR calc non Af Amer: 71 mL/min — ABNORMAL LOW (ref >90)
GFR, EST AFRICAN AMERICAN: 82 mL/min — AB (ref >90)

## 2013-10-20 LAB — COMPREHENSIVE METABOLIC PANEL
ANION GAP: 9 (ref 5–15)
AST: 6 U/L (ref 0–37)
Albumin: 2 g/dL — ABNORMAL LOW (ref 3.5–5.2)
Alkaline Phosphatase: 66 U/L (ref 39–117)
BUN: 16 mg/dL (ref 6–23)
CALCIUM: 8.5 mg/dL (ref 8.4–10.5)
CO2: 34 meq/L — AB (ref 19–32)
Chloride: 96 mEq/L (ref 96–112)
Creatinine, Ser: 0.89 mg/dL (ref 0.50–1.10)
GFR calc non Af Amer: 75 mL/min — ABNORMAL LOW (ref >90)
GFR, EST AFRICAN AMERICAN: 87 mL/min — AB (ref >90)
GLUCOSE: 82 mg/dL (ref 70–99)
Potassium: 3.3 mEq/L — ABNORMAL LOW (ref 3.7–5.3)
Sodium: 139 mEq/L (ref 137–147)
TOTAL PROTEIN: 6.1 g/dL (ref 6.0–8.3)
Total Bilirubin: <0.2 mg/dL — ABNORMAL LOW (ref 0.3–1.2)

## 2013-10-20 LAB — TSH: TSH: 0.374 u[IU]/mL (ref 0.350–4.500)

## 2013-10-20 LAB — CBC
HCT: 31.4 % — ABNORMAL LOW (ref 36.0–46.0)
HEMOGLOBIN: 8.7 g/dL — AB (ref 12.0–15.0)
MCH: 21.6 pg — AB (ref 26.0–34.0)
MCHC: 27.7 g/dL — AB (ref 30.0–36.0)
MCV: 78.1 fL (ref 78.0–100.0)
PLATELETS: 250 10*3/uL (ref 150–400)
RBC: 4.02 MIL/uL (ref 3.87–5.11)
RDW: 13.8 % (ref 11.5–15.5)
WBC: 11.4 10*3/uL — ABNORMAL HIGH (ref 4.0–10.5)

## 2013-10-20 LAB — TROPONIN I
Troponin I: <0.3 ng/mL (ref <0.30)
Troponin I: <0.3 ng/mL (ref <0.30)

## 2013-10-20 LAB — PHOSPHORUS: Phosphorus: 4.2 mg/dL (ref 2.3–4.6)

## 2013-10-20 LAB — PROCALCITONIN: PROCALCITONIN: 2.82 ng/mL

## 2013-10-20 LAB — MAGNESIUM: MAGNESIUM: 1.7 mg/dL (ref 1.5–2.5)

## 2013-10-20 MED ORDER — VANCOMYCIN HCL 10 G IV SOLR
1250.0000 mg | Freq: Once | INTRAVENOUS | Status: AC
  Administered 2013-10-20: 1250 mg via INTRAVENOUS
  Filled 2013-10-20: qty 1250

## 2013-10-20 MED ORDER — MAGNESIUM CITRATE PO SOLN
1.0000 | Freq: Once | ORAL | Status: AC
  Administered 2013-10-20: 1
  Filled 2013-10-20: qty 296

## 2013-10-20 MED ORDER — CEFEPIME HCL 1 G IJ SOLR
1.0000 g | Freq: Three times a day (TID) | INTRAMUSCULAR | Status: DC
  Administered 2013-10-20 – 2013-10-21 (×3): 1 g via INTRAVENOUS
  Administered 2013-10-21: 13:00:00 via INTRAVENOUS
  Administered 2013-10-21 – 2013-10-24 (×9): 1 g via INTRAVENOUS
  Filled 2013-10-20 (×15): qty 1

## 2013-10-20 MED ORDER — BOOST / RESOURCE BREEZE PO LIQD
1.0000 | Freq: Two times a day (BID) | ORAL | Status: DC
  Administered 2013-10-20: 15:00:00 via ORAL
  Administered 2013-10-21 – 2013-10-28 (×8): 1 via ORAL

## 2013-10-20 MED ORDER — METOPROLOL TARTRATE 25 MG PO TABS
50.0000 mg | ORAL_TABLET | ORAL | Status: AC
  Administered 2013-10-20: 50 mg via ORAL
  Filled 2013-10-20: qty 2

## 2013-10-20 MED ORDER — SODIUM CHLORIDE 0.9 % IJ SOLN
10.0000 mL | Freq: Two times a day (BID) | INTRAMUSCULAR | Status: DC
  Administered 2013-10-21 – 2013-10-27 (×4): 10 mL

## 2013-10-20 MED ORDER — SODIUM CHLORIDE 0.9 % IV SOLN
INTRAVENOUS | Status: DC
  Administered 2013-10-21 – 2013-10-24 (×4): via INTRAVENOUS

## 2013-10-20 MED ORDER — VANCOMYCIN HCL IN DEXTROSE 1-5 GM/200ML-% IV SOLN
1000.0000 mg | Freq: Two times a day (BID) | INTRAVENOUS | Status: DC
Start: 2013-10-20 — End: 2013-10-23
  Administered 2013-10-20 – 2013-10-23 (×6): 1000 mg via INTRAVENOUS
  Filled 2013-10-20 (×6): qty 200

## 2013-10-20 MED ORDER — POTASSIUM CHLORIDE 20 MEQ/15ML (10%) PO LIQD
60.0000 meq | Freq: Four times a day (QID) | ORAL | Status: DC
  Administered 2013-10-20: 60 meq via ORAL
  Filled 2013-10-20: qty 45

## 2013-10-20 MED ORDER — METOPROLOL TARTRATE 50 MG PO TABS
50.0000 mg | ORAL_TABLET | Freq: Two times a day (BID) | ORAL | Status: DC
  Administered 2013-10-20 – 2013-10-28 (×14): 50 mg via ORAL
  Filled 2013-10-20 (×2): qty 2
  Filled 2013-10-20 (×2): qty 1
  Filled 2013-10-20 (×3): qty 2
  Filled 2013-10-20: qty 1
  Filled 2013-10-20: qty 2
  Filled 2013-10-20: qty 1
  Filled 2013-10-20: qty 2
  Filled 2013-10-20 (×2): qty 1
  Filled 2013-10-20 (×4): qty 2
  Filled 2013-10-20: qty 1

## 2013-10-20 MED ORDER — SENNOSIDES 8.8 MG/5ML PO SYRP
10.0000 mL | ORAL_SOLUTION | Freq: Two times a day (BID) | ORAL | Status: DC
  Administered 2013-10-20 – 2013-10-28 (×16): 10 mL via ORAL
  Filled 2013-10-20 (×20): qty 10

## 2013-10-20 MED ORDER — SODIUM CHLORIDE 0.9 % IJ SOLN
10.0000 mL | INTRAMUSCULAR | Status: DC | PRN
  Administered 2013-10-28: 10 mL

## 2013-10-20 MED ORDER — SODIUM CHLORIDE 0.9 % IV SOLN
INTRAVENOUS | Status: DC
  Administered 2013-10-20 – 2013-10-27 (×2): via INTRAVENOUS

## 2013-10-20 MED ORDER — POTASSIUM CHLORIDE CRYS ER 20 MEQ PO TBCR
60.0000 meq | EXTENDED_RELEASE_TABLET | Freq: Once | ORAL | Status: AC
  Administered 2013-10-20: 60 meq via ORAL
  Filled 2013-10-20: qty 3

## 2013-10-20 NOTE — Progress Notes (Addendum)
[facility] PROGRESS NOTE   [name] ZOX:[id number] DOB: 08-Apr-1965 DOA: 10/19/2013 PCP: Terald Sleeper, MD  HPI/Subjective: Patient answers yes or no questions by nodding head, cannot talk because of the trach. Complains about an hour cough, secretions and mild abdominal pain/tenderness.  Assessment/Plan: Active Problems:   Tracheostomy in place   Quadriplegia   Hypoxemia   UTI (urinary tract infection)   Hypoxia    Sepsis -Heart rate of 112, respiratory rate is 27 the presence of UTI and suspected respiratory tract infection. -Blood pressure fluctuates, likely secondary to quadriplegia, cannot rule out that it is secondary to sepsis. -Blood and urine cultures are pending. -Pt has history of Pseudomonas in both urine and sputum so ceftriaxone and switched to cefepime, vancomycin added.  Acute and chronic respiratory failure -Patient has chronic respiratory failure, she does have tracheostomy set in place. -Patient presented with hypoxia with oxygen saturation down to 82%. -Currently on 35% oxygen through the trach collar. -This is likely secondary to concurrent sepsis from UTI in addition to suspected pneumonia.  UTI -Associated with indwelling Foley catheter. -Patient initially started on ceftriaxone, antibiotic switched to cefepime and vancomycin. -Foley urine culture results on this one spot it accordingly.  Possible HCAP -Patient has persistent LLL infiltrates, but now presented with increased secretions and hypoxia. -Obtain tracheal secretions culture, patient started on cefepime and vancomycin. -Continue oxygen, provide bronchodilators, mucolytics and antibiotics.  Quadriplegia -Secondary to previous a spinal injury, continue frequent turns, change indwelling Foley catheter.  Abdominal pain -Abdominal x-rays done in the, showed no evidence of acute abnormalities. -Provide a bowel regimen. Had old nonfunctioning pain pump in the right side of  her abdominal wall (subcutaneous).  Elevated d-dimer -Patient has elevated d-dimer of 1.78, also hypoxia. VQ scan ordered to rule out PE. -I think patient hypoxia, tachycardia and elevated D. dimers are secondary to sepsis. -I discontinued VQ scan, patient will be on prophylactic dose of Lovenox. -Hypoxia continues and tachycardia, can consider a CT angiography as renal function improved.  Code Status: Full code Family Communication: Plan discussed with the patient. Disposition Plan: Remains inpatient   Consultants:  None  Procedures:  None  Antibiotics:  Ceftriaxone 9/7>> 9/8  Cefepime 9/8  Vancomycin 9/8   Objective: Filed Vitals:   10/20/13 0800  BP: 131/74  Pulse: 112  Temp: 99.3 F (37.4 C)  Resp: 27    Intake/Output Summary (Last 24 hours) at 10/20/13 0953 Last data filed at 10/20/13 0500  Gross per 24 hour  Intake   1080 ml  Output   1300 ml  Net   -220 ml   Filed Weights   10/19/13 2200  Weight: 87.7 kg (193 lb 5.5 oz)    Exam: General: Alert and awake, oriented x3, not in any acute distress. HEENT: anicteric sclera, pupils reactive to light and accommodation, EOMI CVS: S1-S2 clear, no murmur rubs or gallops Chest: clear to auscultation bilaterally, no wheezing, rales or rhonchi Abdomen: soft nontender, nondistended, normal bowel sounds, no organomegaly Extremities: no cyanosis, clubbing or edema noted bilaterally Neuro: Cranial nerves II-XII intact, no focal neurological deficits  Data Reviewed: Basic Metabolic Panel:  Recent Labs Lab 10/19/13 1634 10/20/13 0005 10/20/13 0445  NA 133*  --  139  K 3.8  --  3.3*  CL 92*  --  96  CO2  --   --  34*  GLUCOSE 81  --  82  BUN 21  --  16  CREATININE 1.40* 0.94 0.89  CALCIUM  --   --  8.5  MG  --   --  1.7  PHOS  --   --  4.2   Liver Function Tests:  Recent Labs Lab 10/20/13 0445  AST 6  ALT <5  ALKPHOS 66  BILITOT <0.2*  PROT 6.1  ALBUMIN 2.0*   No results found for this  basename: LIPASE, AMYLASE,  in the last 168 hours No results found for this basename: AMMONIA,  in the last 168 hours CBC:  Recent Labs Lab 10/19/13 1622 10/19/13 1634 10/20/13 0445  WBC 10.2  --  11.4*  NEUTROABS 6.4  --   --   HGB 9.3* 11.2* 8.7*  HCT 32.6* 33.0* 31.4*  MCV 76.5*  --  78.1  PLT 234  --  250   Cardiac Enzymes:  Recent Labs Lab 10/20/13 0005 10/20/13 0445  TROPONINI <0.30 <0.30   BNP (last 3 results) No results found for this basename: PROBNP,  in the last 8760 hours CBG: No results found for this basename: GLUCAP,  in the last 168 hours  Micro Recent Results (from the past 240 hour(s))  MRSA PCR SCREENING     Status: None   Collection Time    10/19/13 10:09 PM      Result Value Ref Range Status   MRSA by PCR NEGATIVE  NEGATIVE Final   Comment:            The GeneXpert MRSA Assay (FDA     approved for NASAL specimens     only), is one component of a     comprehensive MRSA colonization     surveillance program. It is not     intended to diagnose MRSA     infection nor to guide or     monitor treatment for     MRSA infections.     Studies: Dg Abd Acute W/chest  10/19/2013   CLINICAL DATA:  Abdominal pain, quadriplegic  EXAM: ACUTE ABDOMEN SERIES (ABDOMEN 2 VIEW & CHEST 1 VIEW)  COMPARISON:  [facility]  FINDINGS: There is no evidence of dilated bowel loops or free intraperitoneal air. No radiopaque calculi or other significant radiographic abnormality is seen. Heart size is moderately enlarged, unchanged. Stable left lower lobe opacity is again identified. The spinal stimulator projects over the right abdomen. Tracheostomy tube is noted. Cervical fusion hardware partly visualized. Left-sided Port-A-Cath terminates over the mid SVC. The patient could not be appropriately positioned due to immobility. Left hip degenerative change again noted. Feeding tube in place.  IMPRESSION: Nonobstructive bowel gas pattern.   Persistent left lower lobe consolidation, possibly atelectasis although pneumonia could appear similar.   Electronically Signed   By: Christiana Pellant M.D.   On: 10/19/2013 17:37    Scheduled Meds: . aspirin  325 mg Oral Daily  . cefTRIAXone (ROCEPHIN) IVPB 1 gram/50 mL D5W  1 g Intravenous QHS  . docusate sodium  100 mg Oral BID  . enoxaparin (LOVENOX) injection  40 mg Subcutaneous QHS  . guaiFENesin  15 mL Oral 3 times per day  . ipratropium-albuterol  3 mL Nebulization Q6H  . metoprolol tartrate  50 mg Oral BID  . polyethylene glycol  17 g Oral Daily  . polyvinyl alcohol  1 drop Both Eyes QID  . sodium chloride  3 mL Intravenous Q12H  . sucralfate  1 g Oral TID WC & HS  . venlafaxine  100 mg Oral BID  . ziprasidone  40 mg Oral Daily  . ziprasidone  80 mg Oral BID WC   Continuous Infusions: . sodium chloride 10 mL/hr at 10/20/13 0100       Time spent: 35 minutes    [facility]  [facility] Pager [phone] If 7PM-7AM, please contact night-coverage at [email or web address], password [facility] 10/20/2013, 9:53 AM  LOS: 1 day

## 2013-10-20 NOTE — Progress Notes (Addendum)
ANTIBIOTIC CONSULT NOTE - INITIAL  Pharmacy Consult for Vancomycin/Cefepime Indication: Sepsis  Assessment: 47yoF with quadriplegia (SNF resident) admitted on 9/7 for SOB and clogged feeding tube.  Noted to have UTI and chronic infiltrate on CXR.  Experienced hypotension responding to IVF and low SpO2 improved with O2.   Ceftriaxone changed to vanc/cefepime today 9/8.  Ceftriaxone 9/7 x 1 Vancomycin 9/8  >>  D1 Cefepime 9/8 >>  D1  Tmax: 99.4 WBCs: trending upward; now slightly elevated Renal: SCr 0.9; this appears to be her historical baseline despite paralysis.  CrCl 79 ml/min  9/7 blood: pending x 2 9/7 urine: pending (UA positive for NO, LE, WBC, Bact)  Goal of Therapy:  Vancomycin trough level 15-20 mcg/ml Eradication of infection Appropriate renal dosing of antibiotics  Plan:   Vancomycin 1250 mg IV x 1, then 1000 mg IV q12h, to start 8 hrs after initial dose (should help with loading this patient with BMI 35)  Cefepime 1g IV q8h  Measure antibiotic drug levels at steady state  Follow up culture results, clinical course  Bernadene Person, PharmD Pager: [phone] 10/20/2013, 11:03 AM   Allergies  Allergen Reactions  . Latex Other (See Comments)    Reaction unknown    Patient Measurements: Height:  (157.5 cm) Weight: 193 lb 5.5 oz (87.7 kg) IBW/kg (Calculated) : 50.1 Adjusted Body Weight: 65.2 kg  Vital Signs: Temp: 99.3 F (37.4 C) (09/08 0800) Temp src: Oral (09/08 0800) BP: 131/74 mmHg (09/08 0800) Pulse Rate: 112 (09/08 0800) Intake/Output from previous day: 09/07 0701 - 09/08 0700 In: 1080 [P.O.:960; I.V.:70; IV Piggyback:50] Out: 1300 [Urine:1300] Intake/Output from this shift:    Labs:  Recent Labs  10/19/13 1622 10/19/13 1634 10/20/13 0005 10/20/13 0445  WBC 10.2  --   --  11.4*  HGB 9.3* 11.2*  --  8.7*  PLT 234  --   --  250  CREATININE  --  1.40* 0.94 0.89   Estimated Creatinine Clearance: 79.4 ml/min (by C-G formula based  on Cr of 0.89). No results found for this basename: VANCOTROUGH, Leodis Binet, VANCORANDOM, GENTTROUGH, GENTPEAK, GENTRANDOM, TOBRATROUGH, TOBRAPEAK, TOBRARND, AMIKACINPEAK, AMIKACINTROU, AMIKACIN,  in the last 72 hours   Microbiology: Recent Results (from the past 720 hour(s))  MRSA PCR SCREENING     Status: None   Collection Time    10/19/13 10:09 PM      Result Value Ref Range Status   MRSA by PCR NEGATIVE  NEGATIVE Final   Comment:            The GeneXpert MRSA Assay (FDA     approved for NASAL specimens     only), is one component of a     comprehensive MRSA colonization     surveillance program. It is not     intended to diagnose MRSA     infection nor to guide or     monitor treatment for     MRSA infections.    Medical History: Past Medical History  Diagnosis Date  . Respiratory failure   . Dysphagia   . Contracture of hand joint   . Anemia   . Neurogenic bladder   . Anxiety   . Schizophrenia   . Quadriplegia   . Hypertension   . CHF (congestive heart failure)   . Anginal pain   . Right leg DVT 1980's  . Pneumonia     "today; I get it alot" (07/21/2012)  . History of blood transfusion     "  I've had a few" (07/21/2012)  . GERD (gastroesophageal reflux disease)   . Arthritis     "in my hands" (07/21/2012)  . Depression   . Kidney stones     "about 4; 2 different times" (6/92014)  . On home oxygen therapy     "38% q hs" (07/21/2012)    Medications:  Infusions:  . sodium chloride 10 mL/hr at 10/20/13 0100  . sodium chloride 100 mL/hr at 10/20/13 1021   Anti-infectives   Start     Dose/Rate Route Frequency Ordered Stop   10/20/13 1700  vancomycin (VANCOCIN) IVPB 1000 mg/200 mL premix     1,000 mg 200 mL/hr over 60 Minutes Intravenous Every 12 hours 10/20/13 1038     10/20/13 1100  vancomycin (VANCOCIN) 1,250 mg in sodium chloride 0.9 % 250 mL IVPB     1,250 mg 166.7 mL/hr over 90 Minutes Intravenous  Once 10/20/13 1038     10/20/13 1100  ceFEPIme (MAXIPIME) 1 g  in dextrose 5 % 50 mL IVPB     1 g 100 mL/hr over 30 Minutes Intravenous Every 8 hours 10/20/13 1038     10/19/13 2230  cefTRIAXone (ROCEPHIN) 1 g in dextrose 5 % 50 mL IVPB  Status:  Discontinued     1 g 100 mL/hr over 30 Minutes Intravenous Daily at bedtime 10/19/13 2148 10/20/13 1014

## 2013-10-20 NOTE — Plan of Care (Signed)
Problem: Phase I Progression Outcomes Goal: OOB as tolerated unless otherwise ordered Outcome: Not Applicable Date Met:  68/03/21 Pt bedrest, unable to get OOB, will need lift to get to chair Goal: Code status addressed with pt/family Outcome: Completed/Met Date Met:  10/20/13 Pt wishes to remain full code Goal: Initial discharge plan identified Outcome: Completed/Met Date Met:  10/20/13 Pt wishes to go back to [facility] Goal: Voiding-avoid urinary catheter unless indicated Outcome: Not Met (add Reason) Pt with chronic foley use

## 2013-10-20 NOTE — Progress Notes (Signed)
CARE MANAGEMENT NOTE 10/20/2013  Patient:  [name], [name]   Account Number:  [id number]  Date Initiated:  10/20/2013  Documentation initiated by:  Gabrianna Fassnacht  Subjective/Objective Assessment:   history of Respiratory failure; Dysphagia; Contracture of hand joint; Anemia; Neurogenic bladder; Anxiety; Schizophrenia; Quadriplegia; Hypertension; CHF (congestive heart failure); Anginal pain; Right leg DVT (1980's); Pneumonia; History     Action/Plan:   return to snf on dc   Anticipated DC Date:  10/23/2013   Anticipated DC Plan:  SKILLED NURSING FACILITY  In-house referral  Clinical Social Worker      DC Planning Services  CM consult      [facility] Choice  NA   Choice offered to / List presented to:  NA      DME agency  NA        HH agency  NA   Status of service:  In process, will continue to follow Medicare Important Message given?   (If response is "NO", the following Medicare IM given date fields will be blank) Date Medicare IM given:   Medicare IM given by:   Date Additional Medicare IM given:   Additional Medicare IM given by:    Discharge Disposition:    Per UR Regulation:  Reviewed for med. necessity/level of care/duration of stay  If discussed at Long Length of Stay Meetings, dates discussed:    Comments:  Bjorn Loser Nupur Hohman,RN,BSN,CCM

## 2013-10-20 NOTE — Progress Notes (Signed)
Clinical Social Work Department BRIEF PSYCHOSOCIAL ASSESSMENT 10/20/2013  Patient:  [name], [name]     Account Number:  [id number]     Admit date:  10/19/2013  Clinical Social Worker:  Ulyess Blossom  Date/Time:  10/20/2013 12:00 N  Referred by:  Physician  Date Referred:  10/20/2013 Referred for  SNF Placement   Other Referral:   Interview type:  Patient Other interview type:    PSYCHOSOCIAL DATA Living Status:  FACILITY Admitted from facility:  [facility] Level of care:  [place] Primary support name:  [name] Primary support relationship to patient:  SIBLING Degree of support available:   strong    CURRENT CONCERNS Current Concerns  Post-Acute Placement   Other Concerns:    SOCIAL WORK ASSESSMENT / PLAN CSW received referral that pt admitted from [facility].    CSW met with pt at bedside. CSW introduced self and explained role. Pt confirmed that she is a long term resident at [facility] and plans to return upon discharge. Pt states that her sister is a strong support to her and requested CSW contact pt sister and ask pt sister to contact pt. CSW expressed understanding and notified pt that CSW would contact pt sister.    CSW contacted pt sister via telephone. CSW introduced self and explained role. Pt sister stated that she was aware that pt was in the hospital and had spoken to pt RN, but stated that she would contact pt in the room. Pt sister appreciative of CSW phone call.    CSW contacted [facility] and confirmed that pt could return when medically stable for discharge. CSW faxed clinical information to [facility].    CSW to continue to follow and provide support and assist with pt transition back to [facility] once medically stable.   Assessment/plan status:  Psychosocial Support/Ongoing Assessment of Needs Other assessment/ plan:   discharge planning   Information/referral to community resources:    Referral back to [facility]    PATIENT'S/FAMILY'S RESPONSE TO PLAN OF CARE: Pt alert and oriented x 4. Pt nods to communicate, but can also communicate verbally. Pt plans to return to [facility] and appeciative of CSW support and assistance.   [name], MSW, [place] Work [phone]

## 2013-10-20 NOTE — Progress Notes (Signed)
INITIAL NUTRITION ASSESSMENT  DOCUMENTATION CODES Per approved criteria  -Obesity Unspecified   INTERVENTION: - Diet advancement per MD - Resource Breeze BID - RD to continue to monitor   NUTRITION DIAGNOSIS: Inadequate oral intake related to clear liquid diet as evidenced by diet order.   Goal: Advance diet as tolerated to low sodium diet  Monitor:  Weights, labs, diet advancement  Reason for Assessment: Low braden  48 y.o. female  Admitting Dx: Sepsis  ASSESSMENT: Pt with past medical history of Respiratory failure; Dysphagia; Contracture of hand joint; Anemia; Neurogenic bladder; Anxiety; Schizophrenia; Quadriplegia; Hypertension; CHF (congestive heart failure); Anginal pain; Right leg DVT (1980's); Pneumonia; History of blood transfusion; GERD (gastroesophageal reflux disease); Arthritis; Depression; Kidney stones; and on oxygen therapy. From [facility]. Admitted with shortness of breath and clogged feeding tube. Has tracheostomy tube.   - Met with pt who reports she was on a regular/thin liquid diet at [facility] and had a good appetite and was eating 3 meals/day - Reports she had not used PEG for a long time for feeding, was just getting water put through it and was taking medications orally - Pt denied any problems chewing or swallowing - Reports losing 100 pounds in the past 8 months due to not eating well however weight trend shows pt's weight down 27 pounds in the past 15 months - RN reports pt did well with coke and applesauce - Per RN, PEG still clogged today   Potassium slightly low, getting oral replacement Magnesium and phosphorus WNL   Height: Ht Readings from Last 1 Encounters:  10/19/13 5' 2"  (1.575 m)    Weight: Wt Readings from Last 1 Encounters:  10/19/13 193 lb 5.5 oz (87.7 kg)    Ideal Body Weight: 100 lbs (adjusted for quadriplegia)  % Ideal Body Weight: 193%  Wt Readings from Last 10 Encounters:  10/19/13 193 lb 5.5 oz (87.7 kg)   10/12/13 208 lb (94.348 kg)  09/07/13 205 lb (92.987 kg)  08/23/13 207 lb (93.895 kg)  07/31/13 215 lb (97.523 kg)  08/05/13 215 lb (97.523 kg)  07/09/13 215 lb (97.523 kg)  05/26/13 215 lb (97.523 kg)  07/21/12 220 lb 10.9 oz (100.1 kg)  03/13/12 220 lb 10.9 oz (100.1 kg)    Usual Body Weight: 215 lbs in June 2015  % Usual Body Weight: 90%  BMI:  Body mass index is 35.35 kg/(m^2). Class II obesity   Estimated Nutritional Needs: Kcal: 1500-1700 Protein: 65-80g Fluid: 1.5-1.7L/day   Skin: intact   Diet Order: Clear Liquid  EDUCATION NEEDS: -No education needs identified at this time   Intake/Output Summary (Last 24 hours) at 10/20/13 1024 Last data filed at 10/20/13 0500  Gross per 24 hour  Intake   1080 ml  Output   1300 ml  Net   -220 ml    Last BM: 9/7  Labs:   Recent Labs Lab 10/19/13 1634 10/20/13 0005 10/20/13 0445  NA 133*  --  139  K 3.8  --  3.3*  CL 92*  --  96  CO2  --   --  34*  BUN 21  --  16  CREATININE 1.40* 0.94 0.89  CALCIUM  --   --  8.5  MG  --   --  1.7  PHOS  --   --  4.2  GLUCOSE 81  --  82    CBG (last 3)  No results found for this basename: GLUCAP,  in the last 72 hours  Scheduled Meds: . aspirin  325 mg Oral Daily  . enoxaparin (LOVENOX) injection  40 mg Subcutaneous QHS  . guaiFENesin  15 mL Oral 3 times per day  . ipratropium-albuterol  3 mL Nebulization Q6H  . magnesium citrate  1 Bottle Per Tube Once  . metoprolol tartrate  50 mg Oral BID  . polyethylene glycol  17 g Oral Daily  . polyvinyl alcohol  1 drop Both Eyes QID  . potassium chloride  60 mEq Oral Q6H  . sennosides  10 mL Oral BID  . sodium chloride  3 mL Intravenous Q12H  . sucralfate  1 g Oral TID WC & HS  . venlafaxine  100 mg Oral BID  . ziprasidone  40 mg Oral Daily  . ziprasidone  80 mg Oral BID WC    Continuous Infusions: . sodium chloride 10 mL/hr at 10/20/13 0100  . sodium chloride 100 mL/hr at 10/20/13 1021    Past Medical History   Diagnosis Date  . Respiratory failure   . Dysphagia   . Contracture of hand joint   . Anemia   . Neurogenic bladder   . Anxiety   . Schizophrenia   . Quadriplegia   . Hypertension   . CHF (congestive heart failure)   . Anginal pain   . Right leg DVT 1980's  . Pneumonia     "today; I get it alot" (07/21/2012)  . History of blood transfusion     "I've had a few" (07/21/2012)  . GERD (gastroesophageal reflux disease)   . Arthritis     "in my hands" (07/21/2012)  . Depression   . Kidney stones     "about 4; 2 different times" (6/92014)  . On home oxygen therapy     "38% q hs" (07/21/2012)    Past Surgical History  Procedure Laterality Date  . Tracheostomy  2011  . Gastrostomy w/ feeding tube    . Tubal ligation  1996  . Cystoscopy/retrograde/ureteroscopy/stone extraction with basket    . Lithotripsy    . Peg placement      Carlis Stable MS, RD, [place] [phone] Pager [phone] Weekend/After Hours Pager

## 2013-10-20 NOTE — Progress Notes (Signed)
Patients heart rate noted to be a steady increase up to 130's to 140, on looking back at medications she is to take Lopressor 50 twice a day but has not received a dose since hospitalization.  Notified pharmacy, Lopressor 50 mg obtained and administered.  Will continue to monitor heart rate.

## 2013-10-20 NOTE — Progress Notes (Signed)
Pt desated to the 60s and while respiratory was suctioning her, pt had an 8 second pause.  Pt then complained of 10/10 chest pain.  Notified MD and took pt straight for stat CT.  Pt refused CT due to dye allergy (allergy is not documented in EPIC, MD made aware) and along the way pain level was 6/10 chest pain.  Vitals stable. Will continue to monitor pt.

## 2013-10-21 DIAGNOSIS — J189 Pneumonia, unspecified organism: Secondary | ICD-10-CM

## 2013-10-21 LAB — URINE CULTURE

## 2013-10-21 LAB — PROCALCITONIN: Procalcitonin: 3.03 ng/mL

## 2013-10-21 MED ORDER — POTASSIUM CHLORIDE CRYS ER 20 MEQ PO TBCR
40.0000 meq | EXTENDED_RELEASE_TABLET | Freq: Once | ORAL | Status: AC
  Administered 2013-10-21: 40 meq via ORAL
  Filled 2013-10-21: qty 2

## 2013-10-21 MED ORDER — COLLAGENASE 250 UNIT/GM EX OINT
TOPICAL_OINTMENT | Freq: Every day | CUTANEOUS | Status: DC
  Administered 2013-10-23 – 2013-10-28 (×5): via TOPICAL
  Filled 2013-10-21: qty 30

## 2013-10-21 NOTE — Clinical Documentation Improvement (Signed)
Md's, NP's, and, PA's  Patient admitted with a UTI   with hx of  Chronic indwelling catheter at nursing home, Is UTI due to foley catheter or other condition?  Thank you    Possible Clinical Condition?  Yes, UTI due to Indwelling Foley Catheter   No,  UTI not due to Indwelling Foley Catheter  Other Condition  Cannot Clinically Determine     Treatment:  IV Rocephin, Vancomycin, Maxipime  Thank You, Lavonda Jumbo ,RN Clinical Documentation Specialist:  [phone]  [facility]- Health Information Management

## 2013-10-21 NOTE — Evaluation (Signed)
Clinical/Bedside Swallow Evaluation Patient Details  Name: [name] MRN: [id number] Date of Birth: Oct 25, 1965  Today's Date: 10/21/2013 Time: 1125-1214 SLP Time Calculation (min): 49 min  Past Medical History:  Past Medical History  Diagnosis Date  . Respiratory failure   . Dysphagia   . Contracture of hand joint   . Anemia   . Neurogenic bladder   . Anxiety   . Schizophrenia   . Quadriplegia   . Hypertension   . CHF (congestive heart failure)   . Anginal pain   . Right leg DVT 1980's  . Pneumonia     "today; I get it alot" (07/21/2012)  . History of blood transfusion     "I've had a few" (07/21/2012)  . GERD (gastroesophageal reflux disease)   . Arthritis     "in my hands" (07/21/2012)  . Depression   . Kidney stones     "about 4; 2 different times" (6/92014)  . On home oxygen therapy     "38% q hs" (07/21/2012)   Past Surgical History:  Past Surgical History  Procedure Laterality Date  . Tracheostomy  2011  . Gastrostomy w/ feeding tube    . Tubal ligation  1996  . Cystoscopy/retrograde/ureteroscopy/stone extraction with basket    . Lithotripsy    . Peg placement     HPI:  48 year old female admitted 10/21/13 due to SOB, sepsis. PMH significant for chronic respiratory failure, trach/PMSV (2011), quadriplegia, recurrent PNA, GERD, PEG placement, recurrent UTI. Lungs clear per RN.   Assessment / Plan / Recommendation Clinical Impression  Pt allowed oral care with suction. Upper dentures cleaned and placed. No lower dentures noted. Pt oral motor strength and function appears adequate. Pt was presented with multiple consistencies, and did not exhibit overt s/s aspiration on any. Pt had difficulty staying awake, and required constant verbal and tactile stim to maintain alertness during this evaluation. Pt indicated significant pain in her backside, so nursing was summoned to assist in repositioning pt. Will recommend soft diet with chopped meats, thin liquids, meds 1 at a  time with liquid. ST to follow briefly for diet tolerance. Anticipate return to regular diet as strength and endurance improve.    Aspiration Risk  Mild    Diet Recommendation Dysphagia 3 (Mechanical Soft);Thin liquid (chop meats)   Liquid Administration via: Straw Medication Administration: Whole meds with liquid (1@a  time) Supervision: Trained caregiver to feed patient Compensations: Small sips/bites;Slow rate Postural Changes and/or Swallow Maneuvers: Seated upright 90 degrees;Upright 30-60 min after meal    Other  Recommendations Oral Care Recommendations: Oral care BID Other Recommendations: Clarify dietary restrictions;Have oral suction available   Follow Up Recommendations  24 hour supervision/assistance    Frequency and Duration min 1 x/week  1 week   Pertinent Vitals/Pain VSS, pain 10/10 on backside    SLP Swallow Goals  tolerance of least restrictive diet   Swallow Study Prior Functional Status   Tolerated regular diet and thin liquids prior to admit    General Date of Onset: 10/19/13 HPI: 48 year old female admitted 10/21/13 due to SOB, sepsis. PMH significant for chronic respiratory failure, trach/PMSV (2011), quadriplegia, recurrent PNA, GERD, PEG placement, recurrent UTI. Lungs clear per RN. Type of Study: Bedside swallow evaluation Previous Swallow Assessment: FEES 12/2011, BSE 07/2012. Regular diet with thin liquids recommended both times. Diet Prior to this Study: Thin liquids Temperature Spikes Noted: No Respiratory Status: Trach collar Trach Size and Type: Uncuffed;#6;With PMSV in place History of Recent Intubation: No  Behavior/Cognition: Lethargic;Decreased sustained attention;Cooperative;Pleasant mood;Requires cueing (required ongoing stim to maintain alertness) Oral Cavity - Dentition: Dentures, top;Missing dentition Self-Feeding Abilities: Total assist Patient Positioning: Upright in bed Baseline Vocal Quality: Low vocal intensity Volitional Cough:  Weak Volitional Swallow: Unable to elicit    Oral/Motor/Sensory Function Overall Oral Motor/Sensory Function: Appears within functional limits for tasks assessed   Ice Chips Ice chips: Within functional limits Presentation: Spoon   Thin Liquid Thin Liquid: Within functional limits Presentation: Straw    Nectar Thick Nectar Thick Liquid: Not tested   Honey Thick Honey Thick Liquid: Not tested   Puree Puree: Within functional limits Presentation: Spoon   Solid   GO   Celia B. Murvin [facility], CCC-SLP [phone] Solid: Within functional limits       Leigh Aurora 10/21/2013,12:14 PM

## 2013-10-21 NOTE — Consult Note (Signed)
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 Periwound:As described above Dressing procedure/placement/frequency:I will initiate a enzymatic debriding agent (santyl) daily to the eschar and a protective, padding dressing to the surrounding tissue.  Patient is on a low air loss bed and is being turned side to side. WOC nursing team will not follow, but will remain available to this patient, the nursing and medical team.  Please re-consult if needed. Thanks, Ladona Mow, MSN, RN, GNP, [place], CWON-AP [phone])

## 2013-10-21 NOTE — Progress Notes (Signed)
[facility] PROGRESS NOTE   [name] ZOX:[id number] DOB: May 29, 1965 DOA: 10/19/2013 PCP: Terald Sleeper, MD  HPI/Subjective: Patient seen and examined, do chest pain last night with O2 sats dropping to 60s. Stat CT chest was ordered but patient refused to get IV contrast due to dye allergy, though no allergy was documented in the epic.  This morning patient breathing better, denies chest pain. Patient has clogged PEG tube, and does not want to replace it. As patient has been eating regular food at the skilled facility  Assessment/Plan: Principal Problem:   Sepsis Active Problems:   Tracheostomy in place   Quadriplegia   Hypoxemia   UTI (urinary tract infection)   Acute-on-chronic respiratory failure    Sepsis -Heart rate of 112, respiratory rate is 27 the presence of UTI and suspected respiratory tract infection. -Blood pressure fluctuates, likely secondary to quadriplegia, cannot rule out that it is secondary to sepsis. -Blood and urine cultures are pending. -Pt has history of Pseudomonas in both urine and sputum so ceftriaxone and switched to cefepime, vancomycin added.  Acute and chronic respiratory failure -Patient has chronic respiratory failure, she does have tracheostomy set in place. -Patient presented with hypoxia with oxygen saturation down to 82%. -Currently on 35% oxygen through the trach collar. -This is likely secondary to concurrent sepsis from UTI in addition to suspected pneumonia.  UTI -Associated with indwelling Foley catheter. -Patient initially started on ceftriaxone, antibiotic switched to cefepime and vancomycin. -Follow urine culture results   Possible HCAP -Patient has persistent LLL infiltrates, but now presented with increased secretions and hypoxia. -Obtain tracheal secretions culture, patient started on cefepime and vancomycin. -Continue oxygen, provide bronchodilators, mucolytics and antibiotics.  Quadriplegia -Secondary  to previous a spinal injury, continue frequent turns, change indwelling Foley catheter.  Abdominal pain -Abdominal x-rays done in the, showed no evidence of acute abnormalities. -Provide a bowel regimen. Had old nonfunctioning pain pump in the right side of her abdominal wall (subcutaneous). - Fleet enema.  Elevated d-dimer -Patient has elevated d-dimer of 1.78, also hypoxia. VQ scan ordered to rule out PE. -I think patient hypoxia, tachycardia and elevated D. dimers are secondary to sepsis. -I discontinued VQ scan, patient will be on prophylactic dose of Lovenox. - Patient again became hypoxic last night, unable to obtain CT angio as  patient refused IV contrast - Will order VQ scan to rule out pulmonary embolism  Dysphagia - Clinical swallow evaluation was performed, recommend dysphagia 3 diet and with thin  Liquid - If patient tolerates this diet, will get IR consult to remove the PEG tube  Code Status: Full code Family Communication: Plan discussed with patient's sister at home Disposition Plan: Remains inpatient   Consultants:  None  Procedures:  None  Antibiotics:  Ceftriaxone 9/7>> 9/8  Cefepime 9/8  Vancomycin 9/8   Objective: Filed Vitals:   10/21/13 1200  BP: 99/67  Pulse: 110  Temp: 98.6 F (37 C)  Resp: 25    Intake/Output Summary (Last 24 hours) at 10/21/13 1240 Last data filed at 10/21/13 1100  Gross per 24 hour  Intake   4740 ml  Output   3500 ml  Net   1240 ml   Filed Weights   10/19/13 2200  Weight: 87.7 kg (193 lb 5.5 oz)    Exam: Physical Exam: Head: Normocephalic, atraumatic.  Neck: supple,No deformities, masses, or tenderness noted.trach collar in place Lungs: Normal respiratory effort. B/L Clear to auscultation, no crackles or wheezes.  Heart: Regular RR. S1 and S2  normal  Abdomen: BS normoactive. Soft, Nondistended, non-tender.  Extremities: No pretibial edema, no erythema   Data Reviewed: Basic Metabolic Panel:  Recent  Labs Lab 10/19/13 1634 10/20/13 0005 10/20/13 0445  NA 133*  --  139  K 3.8  --  3.3*  CL 92*  --  96  CO2  --   --  34*  GLUCOSE 81  --  82  BUN 21  --  16  CREATININE 1.40* 0.94 0.89  CALCIUM  --   --  8.5  MG  --   --  1.7  PHOS  --   --  4.2   Liver Function Tests:  Recent Labs Lab 10/20/13 0445  AST 6  ALT <5  ALKPHOS 66  BILITOT <0.2*  PROT 6.1  ALBUMIN 2.0*   No results found for this basename: LIPASE, AMYLASE,  in the last 168 hours No results found for this basename: AMMONIA,  in the last 168 hours CBC:  Recent Labs Lab 10/19/13 1622 10/19/13 1634 10/20/13 0445  WBC 10.2  --  11.4*  NEUTROABS 6.4  --   --   HGB 9.3* 11.2* 8.7*  HCT 32.6* 33.0* 31.4*  MCV 76.5*  --  78.1  PLT 234  --  250   Cardiac Enzymes:  Recent Labs Lab 10/20/13 0005 10/20/13 0445 10/20/13 1000 10/20/13 1705  TROPONINI <0.30 <0.30 <0.30 <0.30   BNP (last 3 results) No results found for this basename: PROBNP,  in the last 8760 hours CBG: No results found for this basename: GLUCAP,  in the last 168 hours  Micro Recent Results (from the past 240 hour(s))  URINE CULTURE     Status: None   Collection Time    10/19/13  3:45 PM      Result Value Ref Range Status   Specimen Description URINE, CATHETERIZED   Final   Special Requests NONE   Final   Culture  Setup Time     Final   Value: 10/20/2013 01:27     Performed at [facility] Count PENDING   Incomplete   Culture     Final   Value: Culture reincubated for better growth     Performed at [facility]   Report Status PENDING   Incomplete  CULTURE, BLOOD (ROUTINE X 2)     Status: None   Collection Time    10/19/13  4:45 PM      Result Value Ref Range Status   Specimen Description BLOOD LEFT HAND   Final   Special Requests BOTTLES DRAWN AEROBIC AND ANAEROBIC   Final   Culture  Setup Time     Final   Value: 10/20/2013 01:27     Performed at [facility]   Culture     Final    Value:        BLOOD CULTURE RECEIVED NO GROWTH TO DATE CULTURE WILL BE HELD FOR 5 DAYS BEFORE ISSUING A FINAL NEGATIVE REPORT     Performed at [facility]   Report Status PENDING   Incomplete  CULTURE, BLOOD (ROUTINE X 2)     Status: None   Collection Time    10/19/13  4:50 PM      Result Value Ref Range Status   Specimen Description BLOOD LEFT WRIST   Final   Special Requests BOTTLES DRAWN AEROBIC ONLY   Final   Culture  Setup Time     Final  Value: 10/20/2013 01:28     Performed at [facility]   Culture     Final   Value:        BLOOD CULTURE RECEIVED NO GROWTH TO DATE CULTURE WILL BE HELD FOR 5 DAYS BEFORE ISSUING A FINAL NEGATIVE REPORT     Performed at [facility]   Report Status PENDING   Incomplete  MRSA PCR SCREENING     Status: None   Collection Time    10/19/13 10:09 PM      Result Value Ref Range Status   MRSA by PCR NEGATIVE  NEGATIVE Final   Comment:            The GeneXpert MRSA Assay (FDA     approved for NASAL specimens     only), is one component of a     comprehensive MRSA colonization     surveillance program. It is not     intended to diagnose MRSA     infection nor to guide or     monitor treatment for     MRSA infections.  CULTURE, RESPIRATORY (NON-EXPECTORATED)     Status: None   Collection Time    10/20/13  4:28 PM      Result Value Ref Range Status   Specimen Description TRACHEAL ASPIRATE   Final   Special Requests NONE   Final   Gram Stain     Final   Value: ABUNDANT WBC PRESENT,BOTH PMN AND MONONUCLEAR     RARE SQUAMOUS EPITHELIAL CELLS PRESENT     NO ORGANISMS SEEN     Performed at [facility]   Culture     Final   Value: Culture reincubated for better growth     Performed at [facility]   Report Status PENDING   Incomplete     Studies: Dg Abd Acute W/chest  10/19/2013   CLINICAL DATA:  Abdominal pain, quadriplegic  EXAM: ACUTE ABDOMEN SERIES (ABDOMEN 2 VIEW & CHEST 1 VIEW)  COMPARISON:  [facility]dom[facility]pelvis [facility]  FINDINGS: There is no evidence of dilated bowel loops or free intraperitoneal air. No radiopaque calculi or other significant radiographic abnormality is seen. Heart size is moderately enlarged, unchanged. Stable left lower lobe opacity is again identified. The spinal stimulator projects over the right abdomen. Tracheostomy tube is noted. Cervical fusion hardware partly visualized. Left-sided Port-A-Cath terminates over the mid SVC. The patient could not be appropriately positioned due to immobility. Left hip degenerative change again noted. Feeding tube in place.  IMPRESSION: Nonobstructive bowel gas pattern.  Persistent left lower lobe consolidation, possibly atelectasis although pneumonia could appear similar.   Electronically Signed   By: Christiana Pellant M.D.   On: 10/19/2013 17:37    Scheduled Meds: . aspirin  325 mg Oral Daily  . ceFEPime (MAXIPIME) IV  1 g Intravenous Q8H  . enoxaparin (LOVENOX) injection  40 mg Subcutaneous QHS  . feeding supplement (RESOURCE BREEZE)  1 Container Oral BID BM  . guaiFENesin  15 mL Oral 3 times per day  . ipratropium-albuterol  3 mL Nebulization Q6H  . metoprolol tartrate  50 mg Oral BID  . polyethylene glycol  17 g Oral Daily  . polyvinyl alcohol  1 drop Both Eyes QID  . sennosides  10 mL Oral BID  . sodium chloride  10-40 mL Intracatheter Q12H  . sodium chloride  3 mL Intravenous Q12H  . sucralfate  1 g Oral TID WC & HS  .  vancomycin  1,000 mg Intravenous Q12H  . venlafaxine  100 mg Oral BID  . ziprasidone  40 mg Oral Daily  . ziprasidone  80 mg Oral BID WC   Continuous Infusions: . sodium chloride 10 mL/hr at 10/20/13 0100  . sodium chloride 100 mL/hr at 10/21/13 0313       Time spent: 25 minutes    Dr [facility]  [facility] Pager [phone] If 7PM-7AM, please contact night-coverage at [email or web address], password [facility] 10/21/2013, 12:40 PM  LOS: 2 days

## 2013-10-22 ENCOUNTER — Inpatient Hospital Stay (HOSPITAL_COMMUNITY): Payer: Medicare Other

## 2013-10-22 DIAGNOSIS — K5909 Other constipation: Secondary | ICD-10-CM

## 2013-10-22 LAB — BASIC METABOLIC PANEL
Anion gap: 7 (ref 5–15)
BUN: 10 mg/dL (ref 6–23)
CO2: 29 mEq/L (ref 19–32)
CREATININE: 0.88 mg/dL (ref 0.50–1.10)
Calcium: 8.3 mg/dL — ABNORMAL LOW (ref 8.4–10.5)
Chloride: 100 mEq/L (ref 96–112)
GFR calc Af Amer: 89 mL/min — ABNORMAL LOW (ref >90)
GFR calc non Af Amer: 76 mL/min — ABNORMAL LOW (ref >90)
Glucose, Bld: 111 mg/dL — ABNORMAL HIGH (ref 70–99)
POTASSIUM: 5.6 meq/L — AB (ref 3.7–5.3)
Sodium: 136 mEq/L — ABNORMAL LOW (ref 137–147)

## 2013-10-22 MED ORDER — SODIUM POLYSTYRENE SULFONATE 15 GM/60ML PO SUSP
15.0000 g | Freq: Once | ORAL | Status: AC
  Administered 2013-10-22: 15 g via ORAL
  Filled 2013-10-22: qty 60

## 2013-10-22 MED ORDER — LIDOCAINE VISCOUS 2 % MT SOLN
OROMUCOSAL | Status: AC
Start: 2013-10-22 — End: 2013-10-23
  Filled 2013-10-22: qty 15

## 2013-10-22 MED ORDER — POLYETHYLENE GLYCOL 3350 17 G PO PACK
17.0000 g | PACK | Freq: Two times a day (BID) | ORAL | Status: DC
  Administered 2013-10-22 – 2013-10-28 (×10): 17 g via ORAL
  Filled 2013-10-22 (×15): qty 1

## 2013-10-22 MED ORDER — TECHNETIUM TO 99M ALBUMIN AGGREGATED
5.5000 | Freq: Once | INTRAVENOUS | Status: AC | PRN
  Administered 2013-10-22: 6 via INTRAVENOUS

## 2013-10-22 MED ORDER — MAGNESIUM CITRATE PO SOLN
1.0000 | Freq: Once | ORAL | Status: AC
  Administered 2013-10-22: 1 via ORAL
  Filled 2013-10-22: qty 296

## 2013-10-22 NOTE — Progress Notes (Signed)
During shift change bedside report, pt complained to me that she feels that food is stuck in her throat, on assessment no object seen in pt mouth nor back of mouth. Pt was not in any acute distress, respirations was even and regular, O2 sats was 96% on 28% trach collar. Pt was given water to swallow, pt able to swallow water without any trouble but still denied any relieve of the pressure in her throat. [facility] floor coverage was notified and order for a portable chest xray was received. Pt will continue to be monitored for any sign of respiratory distress.

## 2013-10-22 NOTE — Progress Notes (Signed)
[facility] PROGRESS NOTE   [name] ZOX:[id number] DOB: Aug 23, 1965 DOA: 10/19/2013 PCP: Terald Sleeper, MD  HPI/Subjective: Patient seen and examined, denies chest pain or shortness of breath. Still complains of constipation and abdominal pain. Patient has clogged PEG tube, and does not want to replace it. Patient did have swallow evaluation yesterday and was started on dysphagia 3 diet.  Assessment/Plan: Principal Problem:   Sepsis Active Problems:   Tracheostomy in place   Quadriplegia   Hypoxemia   UTI (urinary tract infection)   Acute-on-chronic respiratory failure    Sepsis -Heart rate of 112, respiratory rate is 27 the presence of UTI and suspected respiratory tract infection. -Blood pressure fluctuates, likely secondary to quadriplegia, cannot rule out that it is secondary to sepsis. -Blood culture is pending and urine cultures only growing morphophytes. -Pt has history of Pseudomonas in both urine and sputum so ceftriaxone and switched to cefepime, vancomycin added. - If the cultures remain negative by tomorrow, I will switch her to by mouth antibiotics  Acute and chronic respiratory failure -Patient has chronic respiratory failure, she does have tracheostomy set in place. -Patient presented with hypoxia with oxygen saturation down to 82%. -Currently on 35% oxygen through the trach collar. -This is likely secondary to concurrent sepsis from UTI in addition to suspected pneumonia.  ? UTI -Associated with indwelling Foley catheter. -Patient initially started on ceftriaxone, antibiotic switched to cefepime and vancomycin. -Urine culture only growing contamination.  Possible HCAP -Patient has persistent LLL infiltrates, but now presented with increased secretions and hypoxia. -Obtain tracheal secretions culture, patient started on cefepime and vancomycin. -Continue oxygen, provide bronchodilators, mucolytics and  antibiotics.  Quadriplegia -Secondary to previous a spinal injury, continue frequent turns, change indwelling Foley catheter.  Abdominal pain/constipation -Provide a bowel regimen. Had old nonfunctioning pain pump in the right side of her abdominal wall (subcutaneous). - Fleet enema was ordered with no significant results --Abdominal x-rays done today, showed nonspecific bowel gas pattern and large amount of stool -Will order magnesium citrate  Elevated d-dimer -Patient has elevated d-dimer of 1.78, also hypoxia. VQ scan ordered to rule out PE. -I think patient hypoxia, tachycardia and elevated D. dimers are secondary to sepsis. -I discontinued VQ scan, patient will be on prophylactic dose of Lovenox. - Patient again became hypoxic last night, unable to obtain CT angio as  patient refused IV contrast - Will order VQ scan to rule out pulmonary embolism  Dysphagia - Clinical swallow evaluation was performed, recommend dysphagia 3 diet and with thin  Liquid -  IR consult to remove the PEG tube  Hyperkalemia - Potassium was 5.6 - Will give 1 dose of Kayexalate 15 g by mouth x1 - Check BMP in a.m.  Code Status: Full code Family Communication: Plan discussed with patient's sister at home Disposition Plan: Remains inpatient   Consultants:  None  Procedures:  None  Antibiotics:  Ceftriaxone 9/7>> 9/8  Cefepime 9/8  Vancomycin 9/8   Objective: Filed Vitals:   10/22/13 0800  BP:   Pulse:   Temp: 99.7 F (37.6 C)  Resp:     Intake/Output Summary (Last 24 hours) at 10/22/13 1052 Last data filed at 10/22/13 0600  Gross per 24 hour  Intake   3640 ml  Output   4925 ml  Net  -1285 ml   Filed Weights   10/19/13 2200  Weight: 87.7 kg (193 lb 5.5 oz)    Exam: Physical Exam: Head: Normocephalic, atraumatic.  Neck: supple,No deformities, masses, or tenderness noted.trach  collar in place Lungs: Normal respiratory effort. B/L Clear to auscultation, no crackles or  wheezes.  Heart: Regular RR. S1 and S2 normal  Abdomen: BS normoactive. Soft, Nondistended, non-tender.  Extremities: No pretibial edema, no erythema   Data Reviewed: Basic Metabolic Panel:  Recent Labs Lab 10/19/13 1634 10/20/13 0005 10/20/13 0445 10/22/13 0400  NA 133*  --  139 136*  K 3.8  --  3.3* 5.6*  CL 92*  --  96 100  CO2  --   --  34* 29  GLUCOSE 81  --  82 111*  BUN 21  --  16 10  CREATININE 1.40* 0.94 0.89 0.88  CALCIUM  --   --  8.5 8.3*  MG  --   --  1.7  --   PHOS  --   --  4.2  --    Liver Function Tests:  Recent Labs Lab 10/20/13 0445  AST 6  ALT <5  ALKPHOS 66  BILITOT <0.2*  PROT 6.1  ALBUMIN 2.0*   No results found for this basename: LIPASE, AMYLASE,  in the last 168 hours No results found for this basename: AMMONIA,  in the last 168 hours CBC:  Recent Labs Lab 10/19/13 1622 10/19/13 1634 10/20/13 0445  WBC 10.2  --  11.4*  NEUTROABS 6.4  --   --   HGB 9.3* 11.2* 8.7*  HCT 32.6* 33.0* 31.4*  MCV 76.5*  --  78.1  PLT 234  --  250   Cardiac Enzymes:  Recent Labs Lab 10/20/13 0005 10/20/13 0445 10/20/13 1000 10/20/13 1705  TROPONINI <0.30 <0.30 <0.30 <0.30   BNP (last 3 results) No results found for this basename: PROBNP,  in the last 8760 hours CBG: No results found for this basename: GLUCAP,  in the last 168 hours  Micro Recent Results (from the past 240 hour(s))  URINE CULTURE     Status: None   Collection Time    10/19/13  3:45 PM      Result Value Ref Range Status   Specimen Description URINE, CATHETERIZED   Final   Special Requests NONE   Final   Culture  Setup Time     Final   Value: 10/20/2013 01:27     Performed at [facility] Count     Final   Value: >=100,000 COLONIES/ML     Performed at [facility]   Culture     Final   Value: Multiple bacterial morphotypes present, none predominant. Suggest appropriate recollection if clinically indicated.     Performed at [facility]    Report Status 10/21/2013 FINAL   Final  CULTURE, BLOOD (ROUTINE X 2)     Status: None   Collection Time    10/19/13  4:45 PM      Result Value Ref Range Status   Specimen Description BLOOD LEFT HAND   Final   Special Requests BOTTLES DRAWN AEROBIC AND ANAEROBIC   Final   Culture  Setup Time     Final   Value: 10/20/2013 01:27     Performed at [facility]   Culture     Final   Value:        BLOOD CULTURE RECEIVED NO GROWTH TO DATE CULTURE WILL BE HELD FOR 5 DAYS BEFORE ISSUING A FINAL NEGATIVE REPORT     Performed at [facility]   Report Status PENDING   Incomplete  CULTURE, BLOOD (ROUTINE X 2)  Status: None   Collection Time    10/19/13  4:50 PM      Result Value Ref Range Status   Specimen Description BLOOD LEFT WRIST   Final   Special Requests BOTTLES DRAWN AEROBIC ONLY   Final   Culture  Setup Time     Final   Value: 10/20/2013 01:28     Performed at [facility]   Culture     Final   Value:        BLOOD CULTURE RECEIVED NO GROWTH TO DATE CULTURE WILL BE HELD FOR 5 DAYS BEFORE ISSUING A FINAL NEGATIVE REPORT     Performed at [facility]   Report Status PENDING   Incomplete  MRSA PCR SCREENING     Status: None   Collection Time    10/19/13 10:09 PM      Result Value Ref Range Status   MRSA by PCR NEGATIVE  NEGATIVE Final   Comment:            The GeneXpert MRSA Assay (FDA     approved for NASAL specimens     only), is one component of a     comprehensive MRSA colonization     surveillance program. It is not     intended to diagnose MRSA     infection nor to guide or     monitor treatment for     MRSA infections.  CULTURE, RESPIRATORY (NON-EXPECTORATED)     Status: None   Collection Time    10/20/13  4:28 PM      Result Value Ref Range Status   Specimen Description TRACHEAL ASPIRATE   Final   Special Requests NONE   Final   Gram Stain     Final   Value: ABUNDANT WBC PRESENT,BOTH PMN AND MONONUCLEAR     RARE SQUAMOUS  EPITHELIAL CELLS PRESENT     NO ORGANISMS SEEN     Performed at [facility]   Culture     Final   Value: MODERATE GRAM NEGATIVE RODS     MODERATE PROTEUS MIRABILIS     Performed at [facility]   Report Status PENDING   Incomplete     Studies: Dg Abd 2 Views  10/22/2013   CLINICAL DATA:  Abdominal distension.  EXAM: ABDOMEN - 2 VIEW  COMPARISON:  10/19/2013  FINDINGS: Left lateral decubitus images were obtained. No evidence for free air. Difficult to exclude right pleural fluid. Again noted is a spinal stimulator device. There is a large amount of stool throughout the abdomen. Nonspecific bowel gas pattern. Degenerative changes in both hips, particularly on the right side.  IMPRESSION: Nonspecific bowel gas pattern. Large amount of stool in the abdomen.   Electronically Signed   By: Richarda Overlie M.D.   On: 10/22/2013 09:23    Scheduled Meds: . aspirin  325 mg Oral Daily  . ceFEPime (MAXIPIME) IV  1 g Intravenous Q8H  . collagenase   Topical Daily  . enoxaparin (LOVENOX) injection  40 mg Subcutaneous QHS  . feeding supplement (RESOURCE BREEZE)  1 Container Oral BID BM  . guaiFENesin  15 mL Oral 3 times per day  . ipratropium-albuterol  3 mL Nebulization Q6H  . metoprolol tartrate  50 mg Oral BID  . polyethylene glycol  17 g Oral BID  . polyvinyl alcohol  1 drop Both Eyes QID  . sennosides  10 mL Oral BID  . sodium chloride  10-40 mL Intracatheter Q12H  .  sodium chloride  3 mL Intravenous Q12H  . sodium polystyrene  15 g Oral Once  . sucralfate  1 g Oral TID WC & HS  . vancomycin  1,000 mg Intravenous Q12H  . venlafaxine  100 mg Oral BID  . ziprasidone  40 mg Oral Daily  . ziprasidone  80 mg Oral BID WC   Continuous Infusions: . sodium chloride 10 mL/hr at 10/20/13 0100  . sodium chloride 100 mL/hr at 10/21/13 1700       Time spent: 25 minutes    [facility]  [facility] Pager [phone] If 7PM-7AM, please contact night-coverage at [email or web address],  password [facility] 10/22/2013, 10:52 AM  LOS: 3 days

## 2013-10-22 NOTE — Progress Notes (Signed)
Speech Language Pathology Treatment: Dysphagia;Passy Muir Speaking valve  Patient Details Name: [name] MRN: [id number] DOB: 06/19/1965 Today's Date: 10/22/2013 Time: 1700-1733 SLP Time Calculation (min): 33 min  Assessment / Plan / Recommendation Clinical Impression  Patient with some difficulty tolerating PMSV (initially unable to phonate, and question of CO2 trapping).  RN suctioned with somewhat improved success noted afterward.  Pt with low grade fever and persistent infiltrate noted on CXR.  Pt appears to tolerate thin liquids at bedside, and requests advancing to regular meats (not chopped), however, pt has had limited po's today due to nausea.  Pt agrees to continue current diet for now.  ? If repeat MBS is indicated due to current CXR and admission for SOB.  Will continue to monitor diet tolerance and may request MBS if it appears she is not tolerating Dysphagia 3 with thin liquids.   HPI HPI: 48 year old female admitted 10/21/13 due to SOB, sepsis. PMH significant for chronic respiratory failure, trach/PMSV (2011), quadriplegia, recurrent PNA, GERD, PEG placement, recurrent UTI. Lungs clear per RN.   Pertinent Vitals Pain Assessment: No/denies pain  SLP Plan  Continue with current plan of care    Recommendations Diet recommendations: Dysphagia 3 (mechanical soft);Thin liquid (Chopped meats) Liquids provided via: Straw Medication Administration: Whole meds with liquid Supervision: Trained caregiver to feed patient Compensations: Small sips/bites;Slow rate Postural Changes and/or Swallow Maneuvers: Seated upright 90 degrees;Upright 30-60 min after meal      Patient may use Passy-Muir Speech Valve: Intermittently with supervision;During all therapies with supervision;During PO intake/meals PMSV Supervision: Full       Oral Care Recommendations: Oral care BID Follow up Recommendations: 24 hour supervision/assistance Plan: Continue with current plan of care    GO      Maryjo Rochester T 10/22/2013, 5:34 PM

## 2013-10-22 NOTE — Progress Notes (Signed)
Patient ID: [name], female   DOB: 1965/06/18, 48 y.o.   MRN: [id number] Per request of primary service, pt's gastrostomy tube was removed in its entirety without immediate complications. Viscous lidocaine was applied to insertion site tract prior to removal. Gauze dressing placed over site. No immediate complications.

## 2013-10-22 NOTE — Progress Notes (Signed)
[name] confirmed that pt's foley was last changed by urologist on 09/17/13.

## 2013-10-23 DIAGNOSIS — I509 Heart failure, unspecified: Secondary | ICD-10-CM

## 2013-10-23 LAB — PROCALCITONIN: Procalcitonin: 2.14 ng/mL

## 2013-10-23 LAB — PRO B NATRIURETIC PEPTIDE: Pro B Natriuretic peptide (BNP): 6779 pg/mL — ABNORMAL HIGH (ref 0–125)

## 2013-10-23 LAB — BASIC METABOLIC PANEL
Anion gap: 8 (ref 5–15)
BUN: 8 mg/dL (ref 6–23)
CO2: 31 mEq/L (ref 19–32)
Calcium: 8.4 mg/dL (ref 8.4–10.5)
Chloride: 97 mEq/L (ref 96–112)
Creatinine, Ser: 0.75 mg/dL (ref 0.50–1.10)
Glucose, Bld: 86 mg/dL (ref 70–99)
Potassium: 4.8 mEq/L (ref 3.7–5.3)
Sodium: 136 mEq/L — ABNORMAL LOW (ref 137–147)

## 2013-10-23 MED ORDER — SORBITOL 70 % SOLN
960.0000 mL | TOPICAL_OIL | Freq: Once | ORAL | Status: AC
  Administered 2013-10-23: 960 mL via RECTAL
  Filled 2013-10-23: qty 240

## 2013-10-23 MED ORDER — FUROSEMIDE 10 MG/ML IJ SOLN
20.0000 mg | Freq: Two times a day (BID) | INTRAMUSCULAR | Status: AC
  Administered 2013-10-23 (×2): 20 mg via INTRAVENOUS
  Filled 2013-10-23 (×2): qty 2

## 2013-10-23 NOTE — Progress Notes (Signed)
Pt expressed relieve of the pressure she felt in her throat, no intervention was done. Pt in no acute distress, vital signs within normal range

## 2013-10-23 NOTE — Progress Notes (Signed)
Speech Language Pathology Treatment: Dysphagia  Patient Details Name: [name] MRN: [id number] DOB: 1966-01-06 Today's Date: 10/23/2013 Time: 4098-1191 SLP Time Calculation (min): 15 min  Assessment / Plan / Recommendation Clinical Impression  Per RN, pt required more suctioning this morning to clear secretions. Pt po intake has been variable, with good intake last pm, little to none taken this date. RN reports pt appears to tolerate what she does eat/drink. Objective study would be beneficial to fully assess swallow function and safety, however, given pt quadriplegia and body habitus, MBS may not be a viable option.  Recommend consideration of FEES if objective study is desired. No po given at this time, due to pt lethargy. ST to continue to assess diet tolerance. Recommend small bites and slow rate when feeding, with time between presentations for pt to clear oral cavity. Cues may be needed to dry swallow.    HPI HPI: 48 year old female admitted 10/21/13 due to SOB, sepsis. PMH significant for chronic respiratory failure, trach/PMSV (2011), quadriplegia, recurrent PNA, GERD, PEG placement, recurrent UTI. Pt requiring increased suctioning to clear secretions. Variable po intake    Pertinent Vitals Pain Assessment: Faces Pain Score: 0-No pain  SLP Plan  Continue with current plan of care    Recommendations Diet recommendations: Dysphagia 3 (mechanical soft);Thin liquid Liquids provided via: Straw Medication Administration: Whole meds with liquid Supervision: Trained caregiver to feed patient Compensations: Small sips/bites;Slow rate Postural Changes and/or Swallow Maneuvers: Seated upright 90 degrees;Upright 30-60 min after meal              Oral Care Recommendations: Oral care BID Follow up Recommendations: 24 hour supervision/assistance Plan: Continue with current plan of care    GO    Mckay Brandt B. Murvin [facility], CCC-SLP [phone]  [name] 10/23/2013, 3:16  PM

## 2013-10-23 NOTE — Progress Notes (Signed)
CARE MANAGEMENT NOTE 10/23/2013  Patient:  [name], [name]   Account Number:  [id number]  Date Initiated:  10/20/2013  Documentation initiated by:  [name]  Subjective/Objective Assessment:   history of Respiratory failure; Dysphagia; Contracture of hand joint; Anemia; Neurogenic bladder; Anxiety; Schizophrenia; Quadriplegia; Hypertension; CHF (congestive heart failure); Anginal pain; Right leg DVT (1980's); Pneumonia; History     Action/Plan:   return to snf on dc   Anticipated DC Date:  10/26/2013   Anticipated DC Plan:  SKILLED NURSING FACILITY  In-house referral  Clinical Social Worker      DC Planning Services  CM consult      [facility] Choice  NA   Choice offered to / List presented to:  NA      DME agency  NA        HH agency  NA   Status of service:  In process, will continue to follow Medicare Important Message given?   (If response is "NO", the following Medicare IM given date fields will be blank) Date Medicare IM given:   Medicare IM given by:   Date Additional Medicare IM given:   Additional Medicare IM given by:    Discharge Disposition:    Per UR Regulation:  Reviewed for med. necessity/level of care/duration of stay  If discussed at Long Length of Stay Meetings, dates discussed:    Comments:  [name] [id number]/LK from [facility]-sputum showing proteus mirabilis and gram neg rods/trach on 28% o2 vis t.collar/temp99.9/bnp [id number]=Increased bilateral lower lobe airspace disease and moderate right pleural effusion. left side port present and in good condition.  Goal will be to return to [name] once bacteremia is stable.

## 2013-10-23 NOTE — Progress Notes (Signed)
ANTIBIOTIC CONSULT NOTE - INITIAL  Pharmacy Consult for Vancomycin/Cefepime Indication: Sepsis  Assessment: 47yoF with quadriplegia (SNF resident) admitted on 9/7 for SOB and clogged feeding tube. Noted to have UTI (indwelling Foley) and chronic infiltrate on CXR. Experienced hypotension responding to IVF and low SpO2 improved with O2. Ceftriaxone changed to vanc/cefepime 9/8 d/t Hx of pseudomonas in urine and sputum.  Ceftriaxone 9/7 x 1 Vancomycin 9/8 >> D1 Cefepime 9/8 >> D1  Tmax: 100.4; spiking at night WBCs: slightly elevated but stable Renal: SCr 0.9; this appears to be her historical baseline despite paralysis. CrCl 80 ml/min  9/7 blood: NGTD x 2 9/7 urine: > 100k colonies; various org; (taken thru old Foley) 9/7 MRSA PCR: negative 9/8 trach aspirate: P. Mirabilis and other GNR   Goal of Therapy:  Vancomycin trough level 15-20 mcg/ml Eradication of infection Appropriate renal dosing of antibiotics  Plan:   Discussed results with MD, will discontinue vancomycin  Continue Cefepime 1g IV q8h  Follow up culture results, clinical course   Allergies  Allergen Reactions  . Latex Other (See Comments)    Reaction unknown    Patient Measurements: Height:  (157.5 cm) Weight: 193 lb 5.5 oz (87.7 kg) IBW/kg (Calculated) : 50.1 Adjusted Body Weight: 65.2 kg  Vital Signs: Temp: 98.9 F (37.2 C) (09/11 0800) Temp src: Oral (09/11 0800) BP: 135/67 mmHg (09/11 0404) Pulse Rate: 84 (09/11 0404) Intake/Output from previous day: 09/10 0701 - 09/11 0700 In: 3010 [P.O.:600; I.V.:2110; IV Piggyback:300] Out: 4400 [Urine:4400] Intake/Output from this shift:    Labs:  Recent Labs  10/22/13 0400 10/23/13 0500  CREATININE 0.88 0.75   Estimated Creatinine Clearance: 88.4 ml/min (by C-G formula based on Cr of 0.75). No results found for this basename: VANCOTROUGH, Leodis Binet, VANCORANDOM, GENTTROUGH, GENTPEAK, GENTRANDOM, TOBRATROUGH, TOBRAPEAK, TOBRARND,  AMIKACINPEAK, AMIKACINTROU, AMIKACIN,  in the last 72 hours   Microbiology: Recent Results (from the past 720 hour(s))  URINE CULTURE     Status: None   Collection Time    10/19/13  3:45 PM      Result Value Ref Range Status   Specimen Description URINE, CATHETERIZED   Final   Special Requests NONE   Final   Culture  Setup Time     Final   Value: 10/20/2013 01:27     Performed at [facility] Count     Final   Value: >=100,000 COLONIES/ML     Performed at [facility]   Culture     Final   Value: Multiple bacterial morphotypes present, none predominant. Suggest appropriate recollection if clinically indicated.     Performed at [facility]   Report Status 10/21/2013 FINAL   Final  CULTURE, BLOOD (ROUTINE X 2)     Status: None   Collection Time    10/19/13  4:45 PM      Result Value Ref Range Status   Specimen Description BLOOD LEFT HAND   Final   Special Requests BOTTLES DRAWN AEROBIC AND ANAEROBIC   Final   Culture  Setup Time     Final   Value: 10/20/2013 01:27     Performed at [facility]   Culture     Final   Value:        BLOOD CULTURE RECEIVED NO GROWTH TO DATE CULTURE WILL BE HELD FOR 5 DAYS BEFORE ISSUING A FINAL NEGATIVE REPORT     Performed at [facility]   Report Status PENDING  Incomplete  CULTURE, BLOOD (ROUTINE X 2)     Status: None   Collection Time    10/19/13  4:50 PM      Result Value Ref Range Status   Specimen Description BLOOD LEFT WRIST   Final   Special Requests BOTTLES DRAWN AEROBIC ONLY   Final   Culture  Setup Time     Final   Value: 10/20/2013 01:28     Performed at [facility]   Culture     Final   Value:        BLOOD CULTURE RECEIVED NO GROWTH TO DATE CULTURE WILL BE HELD FOR 5 DAYS BEFORE ISSUING A FINAL NEGATIVE REPORT     Performed at [facility]   Report Status PENDING   Incomplete  MRSA PCR SCREENING     Status: None   Collection Time    10/19/13 10:09 PM       Result Value Ref Range Status   MRSA by PCR NEGATIVE  NEGATIVE Final   Comment:            The GeneXpert MRSA Assay (FDA     approved for NASAL specimens     only), is one component of a     comprehensive MRSA colonization     surveillance program. It is not     intended to diagnose MRSA     infection nor to guide or     monitor treatment for     MRSA infections.  CULTURE, RESPIRATORY (NON-EXPECTORATED)     Status: None   Collection Time    10/20/13  4:28 PM      Result Value Ref Range Status   Specimen Description TRACHEAL ASPIRATE   Final   Special Requests NONE   Final   Gram Stain     Final   Value: ABUNDANT WBC PRESENT,BOTH PMN AND MONONUCLEAR     RARE SQUAMOUS EPITHELIAL CELLS PRESENT     NO ORGANISMS SEEN     Performed at [facility]   Culture     Final   Value: MODERATE GRAM NEGATIVE RODS     MODERATE PROTEUS MIRABILIS     Performed at [facility]   Report Status PENDING   Incomplete    Medical History: Past Medical History  Diagnosis Date  . Respiratory failure   . Dysphagia   . Contracture of hand joint   . Anemia   . Neurogenic bladder   . Anxiety   . Schizophrenia   . Quadriplegia   . Hypertension   . CHF (congestive heart failure)   . Anginal pain   . Right leg DVT 1980's  . Pneumonia     "today; I get it alot" (07/21/2012)  . History of blood transfusion     "I've had a few" (07/21/2012)  . GERD (gastroesophageal reflux disease)   . Arthritis     "in my hands" (07/21/2012)  . Depression   . Kidney stones     "about 4; 2 different times" (6/92014)  . On home oxygen therapy     "38% q hs" (07/21/2012)    Medications:  Infusions:  . sodium chloride 10 mL/hr at 10/20/13 0100  . sodium chloride 100 mL/hr at 10/22/13 2010   Anti-infectives   Start     Dose/Rate Route Frequency Ordered Stop   10/20/13 1700  vancomycin (VANCOCIN) IVPB 1000 mg/200 mL premix     1,000 mg 200 mL/hr over 60 Minutes Intravenous  Every 12 hours 10/20/13  1038     10/20/13 1100  vancomycin (VANCOCIN) 1,250 mg in sodium chloride 0.9 % 250 mL IVPB     1,250 mg 166.7 mL/hr over 90 Minutes Intravenous  Once 10/20/13 1038 10/20/13 1231   10/20/13 1100  ceFEPIme (MAXIPIME) 1 g in dextrose 5 % 50 mL IVPB     1 g 100 mL/hr over 30 Minutes Intravenous Every 8 hours 10/20/13 1038     10/19/13 2230  cefTRIAXone (ROCEPHIN) 1 g in dextrose 5 % 50 mL IVPB  Status:  Discontinued     1 g 100 mL/hr over 30 Minutes Intravenous Daily at bedtime 10/19/13 2148 10/20/13 1014     Bernadene Person, PharmD Pager: [phone] 10/23/2013, 11:18 AM

## 2013-10-23 NOTE — Progress Notes (Signed)
[facility] PROGRESS NOTE   [name] AVW:[id number] DOB: 01-20-1966 DOA: 10/19/2013 PCP: Terald Sleeper, MD  HPI/Subjective: Patient seen and examined, denies chest pain or shortness of breath. Still complains of constipation and abdominal pain. Patient has clogged PEG tube, and does not want to replace it. Patient did have swallow evaluation yesterday and was started on dysphagia 3 diet.  Assessment/Plan: Principal Problem:   Sepsis Active Problems:   Tracheostomy in place   Quadriplegia   Hypoxemia   UTI (urinary tract infection)   Acute-on-chronic respiratory failure    Sepsis -Heart rate of 112, respiratory rate is 27 the presence of UTI and suspected respiratory tract infection. -Blood pressure fluctuates, likely secondary to quadriplegia, cannot rule out that it is secondary to sepsis. -Blood culture is pending and urine cultures only growing morphophytes. -Pt has history of Pseudomonas in both urine and sputum so ceftriaxone and switched to cefepime, vancomycin added. - Will discontinue vancomycin and start the cefepime.  Acute and chronic respiratory failure -Patient has chronic respiratory failure, she does have tracheostomy set in place. -Patient presented with hypoxia with oxygen saturation down to 82%. -Currently on 35% oxygen through the trach collar. -This is likely secondary to concurrent sepsis from UTI in addition to suspected pneumonia.  ? UTI -Associated with indwelling Foley catheter. -Patient initially started on ceftriaxone, antibiotic switched to cefepime and vancomycin. -Urine culture only growing contamination.  Possible HCAP -Patient has persistent LLL infiltrates, but now presented with increased secretions and hypoxia. -Obtain tracheal secretions culture, patient started on cefepime and vancomycin. -Continue oxygen, provide bronchodilators, mucolytics and antibiotics.  Quadriplegia -Secondary to previous a spinal injury,  continue frequent turns, change indwelling Foley catheter.  Abdominal pain/constipation -Provide a bowel regimen. Had old nonfunctioning pain pump in the right side of her abdominal wall (subcutaneous). - Fleet enema was ordered with no significant results --Abdominal x-rays done today, showed nonspecific bowel gas pattern and large amount of stool - Will order magnesium citrate  Elevated d-dimer -Patient has elevated d-dimer of 1.78, also hypoxia. VQ scan ordered to rule out PE. -I think patient hypoxia, tachycardia and elevated D. dimers are secondary to sepsis. -I discontinued VQ scan, patient will be on prophylactic dose of Lovenox. - Patient again became hypoxic last night, unable to obtain CT angio as  patient refused IV contrast - Will order VQ scan to rule out pulmonary embolism  Dysphagia - Clinical swallow evaluation was performed, recommend dysphagia 3 diet and with thin  Liquid -  IR consult to remove the PEG tube  Hyperkalemia - Potassium was 5.6 - Will give 1 dose of Kayexalate 15 g by mouth x1 - Check BMP in a.m.  Constipation - Will give SMOG enema.  Code Status: Full code Family Communication: Plan discussed with patient's sister at home Disposition Plan: Remains inpatient   Consultants:  None  Procedures:  None  Antibiotics:  Ceftriaxone 9/7>> 9/8  Cefepime 9/8  Vancomycin 9/8>>9/11   Objective: Filed Vitals:   10/23/13 1205  BP:   Pulse: 106  Temp:   Resp: 19    Intake/Output Summary (Last 24 hours) at 10/23/13 1608 Last data filed at 10/23/13 0600  Gross per 24 hour  Intake   2010 ml  Output   2150 ml  Net   -140 ml   Filed Weights   10/19/13 2200  Weight: 87.7 kg (193 lb 5.5 oz)    Exam: Physical Exam: Head: Normocephalic, atraumatic.  Neck: supple,No deformities, masses, or tenderness noted.trach collar in  place Lungs: Normal respiratory effort. B/L Clear to auscultation, no crackles or wheezes.  Heart: Regular RR. S1  and S2 normal  Abdomen: BS normoactive. Soft, Nondistended, non-tender.  Extremities: No pretibial edema, no erythema   Data Reviewed: Basic Metabolic Panel:  Recent Labs Lab 10/19/13 1634 10/20/13 0005 10/20/13 0445 10/22/13 0400 10/23/13 0500  NA 133*  --  139 136* 136*  K 3.8  --  3.3* 5.6* 4.8  CL 92*  --  96 100 97  CO2  --   --  34* 29 31  GLUCOSE 81  --  82 111* 86  BUN 21  --  CREATININE 1.40* 0.94 0.89 0.88 0.75  CALCIUM  --   --  8.5 8.3* 8.4  MG  --   --  1.7  --   --   PHOS  --   --  4.2  --   --    Liver Function Tests:  Recent Labs Lab 10/20/13 0445  AST 6  ALT <5  ALKPHOS 66  BILITOT <0.2*  PROT 6.1  ALBUMIN 2.0*   No results found for this basename: LIPASE, AMYLASE,  in the last 168 hours No results found for this basename: AMMONIA,  in the last 168 hours CBC:  Recent Labs Lab 10/19/13 1622 10/19/13 1634 10/20/13 0445  WBC 10.2  --  11.4*  NEUTROABS 6.4  --   --   HGB 9.3* 11.2* 8.7*  HCT 32.6* 33.0* 31.4*  MCV 76.5*  --  78.1  PLT 234  --  250   Cardiac Enzymes:  Recent Labs Lab 10/20/13 0005 10/20/13 0445 10/20/13 1000 10/20/13 1705  TROPONINI <0.30 <0.30 <0.30 <0.30   BNP (last 3 results)  Recent Labs  10/23/13 0500  PROBNP 6779.0*   CBG: No results found for this basename: GLUCAP,  in the last 168 hours  Micro Recent Results (from the past 240 hour(s))  URINE CULTURE     Status: None   Collection Time    10/19/13  3:45 PM      Result Value Ref Range Status   Specimen Description URINE, CATHETERIZED   Final   Special Requests NONE   Final   Culture  Setup Time     Final   Value: 10/20/2013 01:27     Performed at [facility] Count     Final   Value: >=100,000 COLONIES/ML     Performed at [facility]   Culture     Final   Value: Multiple bacterial morphotypes present, none predominant. Suggest appropriate recollection if clinically indicated.     Performed at [facility]   Report Status 10/21/2013 FINAL   Final  CULTURE, BLOOD (ROUTINE X 2)     Status: None   Collection Time    10/19/13  4:45 PM      Result Value Ref Range Status   Specimen Description BLOOD LEFT HAND   Final   Special Requests BOTTLES DRAWN AEROBIC AND ANAEROBIC   Final   Culture  Setup Time     Final   Value: 10/20/2013 01:27     Performed at [facility]   Culture     Final   Value:        BLOOD CULTURE RECEIVED NO GROWTH TO DATE CULTURE WILL BE HELD FOR 5 DAYS BEFORE ISSUING A FINAL NEGATIVE REPORT     Performed at [facility]   Report  Status PENDING   Incomplete  CULTURE, BLOOD (ROUTINE X 2)     Status: None   Collection Time    10/19/13  4:50 PM      Result Value Ref Range Status   Specimen Description BLOOD LEFT WRIST   Final   Special Requests BOTTLES DRAWN AEROBIC ONLY   Final   Culture  Setup Time     Final   Value: 10/20/2013 01:28     Performed at [facility]   Culture     Final   Value:        BLOOD CULTURE RECEIVED NO GROWTH TO DATE CULTURE WILL BE HELD FOR 5 DAYS BEFORE ISSUING A FINAL NEGATIVE REPORT     Performed at [facility]   Report Status PENDING   Incomplete  MRSA PCR SCREENING     Status: None   Collection Time    10/19/13 10:09 PM      Result Value Ref Range Status   MRSA by PCR NEGATIVE  NEGATIVE Final   Comment:            The GeneXpert MRSA Assay (FDA     approved for NASAL specimens     only), is one component of a     comprehensive MRSA colonization     surveillance program. It is not     intended to diagnose MRSA     infection nor to guide or     monitor treatment for     MRSA infections.  CULTURE, RESPIRATORY (NON-EXPECTORATED)     Status: None   Collection Time    10/20/13  4:28 PM      Result Value Ref Range Status   Specimen Description TRACHEAL ASPIRATE   Final   Special Requests NONE   Final   Gram Stain     Final   Value: ABUNDANT WBC PRESENT,BOTH PMN AND MONONUCLEAR     RARE  SQUAMOUS EPITHELIAL CELLS PRESENT     NO ORGANISMS SEEN     Performed at [facility]   Culture     Final   Value: MODERATE GRAM NEGATIVE RODS     MODERATE PROTEUS MIRABILIS     Performed at [facility]   Report Status PENDING   Incomplete     Studies: Nm Pulmonary Perfusion  October 24, 2013   CLINICAL DATA:  Shortness of Breath  EXAM: NUCLEAR MEDICINE PERFUSION LUNG SCAN  Views: Anterior, posterior, left lateral, right lateral, RPO, LPO, RAO, LAO  Radionuclide:  Technetium 70m macroaggregated albumin  Dose:  5.5 mCi  Route of administration: Intravenous  COMPARISON:  Chest radiograph October 19, 2013  FINDINGS: There is slightly diminished uptake throughout the entire right lung compared to the left. This finding could be due to clearing effusion on the right. No segmental perfusion defects are identified on this study.  IMPRESSION: Question effusion on the right causing generalized decreased uptake on the right compared to the left. No wedge-shaped perfusion defects are identified. This study overall is felt to constitute a low probability of pulmonary embolus.   Electronically Signed   By: Bretta Bang M.D.   On: 24-Oct-2013 14:51   Dg Chest Port 1 View  October 24, 2013   CLINICAL DATA:  Dysphagia.  Chest discomfort.  EXAM: PORTABLE CHEST - 1 VIEW  COMPARISON:  10/19/2013  FINDINGS: Increased moderate size right pleural effusion is seen. Bilateral lower lobe airspace disease is also seen which is increased since previous study. Cardiomegaly is stable.  Tracheostomy tube and left-sided Port-A-Cath remain in appropriate position.  IMPRESSION: Increased bilateral lower lobe airspace disease and moderate right pleural effusion.  Stable cardiomegaly.   Electronically Signed   By: Myles Rosenthal M.D.   On: 10/22/2013 20:39   Dg Abd 2 Views  10/22/2013   CLINICAL DATA:  Abdominal distension.  EXAM: ABDOMEN - 2 VIEW  COMPARISON:  10/19/2013  FINDINGS: Left lateral decubitus images were  obtained. No evidence for free air. Difficult to exclude right pleural fluid. Again noted is a spinal stimulator device. There is a large amount of stool throughout the abdomen. Nonspecific bowel gas pattern. Degenerative changes in both hips, particularly on the right side.  IMPRESSION: Nonspecific bowel gas pattern. Large amount of stool in the abdomen.   Electronically Signed   By: Richarda Overlie M.D.   On: 10/22/2013 09:23    Scheduled Meds: . aspirin  325 mg Oral Daily  . ceFEPime (MAXIPIME) IV  1 g Intravenous Q8H  . collagenase   Topical Daily  . enoxaparin (LOVENOX) injection  40 mg Subcutaneous QHS  . feeding supplement (RESOURCE BREEZE)  1 Container Oral BID BM  . furosemide  20 mg Intravenous Q12H  . guaiFENesin  15 mL Oral 3 times per day  . ipratropium-albuterol  3 mL Nebulization Q6H  . metoprolol tartrate  50 mg Oral BID  . polyethylene glycol  17 g Oral BID  . polyvinyl alcohol  1 drop Both Eyes QID  . sennosides  10 mL Oral BID  . sodium chloride  10-40 mL Intracatheter Q12H  . sodium chloride  3 mL Intravenous Q12H  . sucralfate  1 g Oral TID WC & HS  . venlafaxine  100 mg Oral BID  . ziprasidone  40 mg Oral Daily  . ziprasidone  80 mg Oral BID WC   Continuous Infusions: . sodium chloride 10 mL/hr at 10/20/13 0100  . sodium chloride 100 mL/hr at 10/22/13 2010       Time spent: 25 minutes    [facility]  [facility] Pager [phone] If 7PM-7AM, please contact night-coverage at [email or web address], password [facility] 10/23/2013, 4:08 PM  LOS: 4 days

## 2013-10-24 LAB — CBC
HCT: 31.1 % — ABNORMAL LOW (ref 36.0–46.0)
Hemoglobin: 8.9 g/dL — ABNORMAL LOW (ref 12.0–15.0)
MCH: 21.7 pg — ABNORMAL LOW (ref 26.0–34.0)
MCHC: 28.6 g/dL — AB (ref 30.0–36.0)
MCV: 75.9 fL — ABNORMAL LOW (ref 78.0–100.0)
PLATELETS: 295 10*3/uL (ref 150–400)
RBC: 4.1 MIL/uL (ref 3.87–5.11)
RDW: 13.4 % (ref 11.5–15.5)
WBC: 12.3 10*3/uL — ABNORMAL HIGH (ref 4.0–10.5)

## 2013-10-24 LAB — BASIC METABOLIC PANEL
Anion gap: 9 (ref 5–15)
BUN: 8 mg/dL (ref 6–23)
CALCIUM: 8.4 mg/dL (ref 8.4–10.5)
CO2: 30 mEq/L (ref 19–32)
CREATININE: 0.76 mg/dL (ref 0.50–1.10)
Chloride: 96 mEq/L (ref 96–112)
GFR calc Af Amer: >90 mL/min (ref >90)
GLUCOSE: 97 mg/dL (ref 70–99)
Potassium: 4.3 mEq/L (ref 3.7–5.3)
SODIUM: 135 meq/L — AB (ref 137–147)

## 2013-10-24 LAB — CULTURE, RESPIRATORY W GRAM STAIN

## 2013-10-24 LAB — CULTURE, RESPIRATORY

## 2013-10-24 MED ORDER — CHLORHEXIDINE GLUCONATE 0.12 % MT SOLN
15.0000 mL | Freq: Two times a day (BID) | OROMUCOSAL | Status: DC
  Administered 2013-10-24 – 2013-10-28 (×9): 15 mL via OROMUCOSAL
  Filled 2013-10-24 (×12): qty 15

## 2013-10-24 MED ORDER — FUROSEMIDE 10 MG/ML IJ SOLN
40.0000 mg | Freq: Two times a day (BID) | INTRAMUSCULAR | Status: AC
  Administered 2013-10-24: 40 mg via INTRAVENOUS
  Filled 2013-10-24: qty 4

## 2013-10-24 MED ORDER — LEVOFLOXACIN IN D5W 750 MG/150ML IV SOLN
750.0000 mg | INTRAVENOUS | Status: DC
  Filled 2013-10-24: qty 150

## 2013-10-24 MED ORDER — METOPROLOL TARTRATE 1 MG/ML IV SOLN
5.0000 mg | Freq: Once | INTRAVENOUS | Status: AC
  Administered 2013-10-24: 5 mg via INTRAVENOUS
  Filled 2013-10-24: qty 5

## 2013-10-24 MED ORDER — CETYLPYRIDINIUM CHLORIDE 0.05 % MT LIQD
7.0000 mL | Freq: Two times a day (BID) | OROMUCOSAL | Status: DC
  Administered 2013-10-24 – 2013-10-28 (×7): 7 mL via OROMUCOSAL

## 2013-10-24 MED ORDER — IMIPENEM-CILASTATIN 500 MG IV SOLR
500.0000 mg | Freq: Four times a day (QID) | INTRAVENOUS | Status: DC
  Administered 2013-10-24 – 2013-10-28 (×16): 500 mg via INTRAVENOUS
  Filled 2013-10-24 (×20): qty 500

## 2013-10-24 MED ORDER — HYDRALAZINE HCL 20 MG/ML IJ SOLN
10.0000 mg | Freq: Four times a day (QID) | INTRAMUSCULAR | Status: DC | PRN
Start: 2013-10-24 — End: 2013-10-26
  Administered 2013-10-24: 08:00:00 via INTRAVENOUS
  Filled 2013-10-24: qty 1

## 2013-10-24 NOTE — Progress Notes (Signed)
[facility] PROGRESS NOTE   [name] UEA:[id number] DOB: 1966-02-12 DOA: 10/19/2013 PCP: Terald Sleeper, MD  HPI/Subjective: Patient seen and examined, this morning again became short of breath. Had good diuresis with lasix.  Assessment/Plan: Principal Problem:   Sepsis Active Problems:   Tracheostomy in place   Quadriplegia   Hypoxemia   UTI (urinary tract infection)   Acute-on-chronic respiratory failure    Sepsis -Heart rate of 112, respiratory rate is 27 the presence of UTI and suspected respiratory tract infection. -Blood pressure fluctuates, likely secondary to quadriplegia, cannot rule out that it is secondary to sepsis. -Blood culture is pending and urine cultures only growing morphophytes. -Pt has history of Pseudomonas in both urine and sputum so ceftriaxone and switched to cefepime, vancomycin added. - Will discontinue vancomycin and continue cefepime.  Acute and chronic respiratory failure -Patient has chronic respiratory failure, she does have tracheostomy set in place.  - She has thick yellow sputum from the trach, and the sputum culture is growing Gram negative rods, proteus mirabilis. - She is on cefepime, will also add Levaquin. -Patient presented with hypoxia with oxygen saturation down to 82%. -Currently on 35% oxygen through the trach collar. - CXR showed pleural effusions, will start lasix 40 mg IV q 12 hrs.   ? UTI -Associated with indwelling Foley catheter. -Patient initially started on ceftriaxone, antibiotic switched to cefepime and vancomycin. -Urine culture only growing contamination.  Possible HCAP -Patient has persistent LLL infiltrates, but now presented with increased secretions and hypoxia. -Obtain tracheal secretions culture, patient started on cefepime and vancomycin. - Vanc discontinued, start levaquin. -Continue oxygen, provide bronchodilators, mucolytics and antibiotics.  Quadriplegia -Secondary to previous a  spinal injury, continue frequent turns, change indwelling Foley catheter.  Abdominal pain/constipation - Constipation resolved after the SMOG enema -Provide a bowel regimen. Had old nonfunctioning pain pump in the right side of her abdominal wall (subcutaneous). - Fleet enema was ordered with no significant results --Abdominal x-rays done today, showed nonspecific bowel gas pattern and large amount of stool   Elevated d-dimer -Patient has elevated d-dimer of 1.78, also hypoxia. VQ scan ordered to rule out PE. -I think patient hypoxia, tachycardia and elevated D. dimers are secondary to sepsis. -I discontinued VQ scan, patient will be on prophylactic dose of Lovenox. - Patient again became hypoxic last night, unable to obtain CT angio as  patient refused IV contrast - V/Q scan was low probability for pulmonary embolus.  Dysphagia - Clinical swallow evaluation was performed, recommend dysphagia 3 diet and with thin  Liquid -  IR consult to remove the PEG tube  Hyperkalemia - Resolved,  - Potassium was 5.6, given 1 dose of Kayexalate 15 g by mouth x1 -  Constipation Resolved -After SMOG enema.  Code Status: Full code Family Communication: Plan discussed with patient's sister at home Disposition Plan: Remains inpatient   Consultants:  None  Procedures:  None  Antibiotics:  Ceftriaxone 9/7>> 9/8  Cefepime 9/8  Vancomycin 9/8>>9/11  levaquin 9/12   Objective: Filed Vitals:   10/24/13 1030  BP: 127/74  Pulse:   Temp:   Resp: 27    Intake/Output Summary (Last 24 hours) at 10/24/13 1106 Last data filed at 10/24/13 1020  Gross per 24 hour  Intake   2720 ml  Output   4600 ml  Net  -1880 ml   Filed Weights   10/19/13 2200 10/24/13 0542  Weight: 87.7 kg (193 lb 5.5 oz) 88.3 kg (194 lb 10.7 oz)  Exam: Physical Exam: Head: Normocephalic, atraumatic.  Neck: supple,No deformities, masses, or tenderness noted.trach collar in place Lungs: Bilateral  rhonchi Heart: Regular RR. S1 and S2 normal  Abdomen: BS normoactive. Soft, Nondistended, non-tender.  Extremities: No pretibial edema, no erythema   Data Reviewed: Basic Metabolic Panel:  Recent Labs Lab 10/19/13 1634 10/20/13 0005 10/20/13 0445 10/22/13 0400 10/23/13 0500 10/24/13 0500  NA 133*  --  139 136* 136* 135*  K 3.8  --  3.3* 5.6* 4.8 4.3  CL 92*  --  96 100 97 96  CO2  --   --  34* GLUCOSE 81  --  82 111* 86 97  BUN 21  --  CREATININE 1.40* 0.94 0.89 0.88 0.75 0.76  CALCIUM  --   --  8.5 8.3* 8.4 8.4  MG  --   --  1.7  --   --   --   PHOS  --   --  4.2  --   --   --    Liver Function Tests:  Recent Labs Lab 10/20/13 0445  AST 6  ALT <5  ALKPHOS 66  BILITOT <0.2*  PROT 6.1  ALBUMIN 2.0*   No results found for this basename: LIPASE, AMYLASE,  in the last 168 hours No results found for this basename: AMMONIA,  in the last 168 hours CBC:  Recent Labs Lab 10/19/13 1622 10/19/13 1634 10/20/13 0445 10/24/13 0500  WBC 10.2  --  11.4* 12.3*  NEUTROABS 6.4  --   --   --   HGB 9.3* 11.2* 8.7* 8.9*  HCT 32.6* 33.0* 31.4* 31.1*  MCV 76.5*  --  78.1 75.9*  PLT 234  --  250 295   Cardiac Enzymes:  Recent Labs Lab 10/20/13 0005 10/20/13 0445 10/20/13 1000 10/20/13 1705  TROPONINI <0.30 <0.30 <0.30 <0.30   BNP (last 3 results)  Recent Labs  10/23/13 0500  PROBNP 6779.0*   CBG: No results found for this basename: GLUCAP,  in the last 168 hours  Micro Recent Results (from the past 240 hour(s))  URINE CULTURE     Status: None   Collection Time    10/19/13  3:45 PM      Result Value Ref Range Status   Specimen Description URINE, CATHETERIZED   Final   Special Requests NONE   Final   Culture  Setup Time     Final   Value: 10/20/2013 01:27     Performed at [facility] Count     Final   Value: >=100,000 COLONIES/ML     Performed at [facility]   Culture     Final   Value: Multiple bacterial  morphotypes present, none predominant. Suggest appropriate recollection if clinically indicated.     Performed at [facility]   Report Status 10/21/2013 FINAL   Final  CULTURE, BLOOD (ROUTINE X 2)     Status: None   Collection Time    10/19/13  4:45 PM      Result Value Ref Range Status   Specimen Description BLOOD LEFT HAND   Final   Special Requests BOTTLES DRAWN AEROBIC AND ANAEROBIC   Final   Culture  Setup Time     Final   Value: 10/20/2013 01:27     Performed at [facility]   Culture     Final   Value:  BLOOD CULTURE RECEIVED NO GROWTH TO DATE CULTURE WILL BE HELD FOR 5 DAYS BEFORE ISSUING A FINAL NEGATIVE REPORT     Performed at [facility]   Report Status PENDING   Incomplete  CULTURE, BLOOD (ROUTINE X 2)     Status: None   Collection Time    10/19/13  4:50 PM      Result Value Ref Range Status   Specimen Description BLOOD LEFT WRIST   Final   Special Requests BOTTLES DRAWN AEROBIC ONLY   Final   Culture  Setup Time     Final   Value: 10/20/2013 01:28     Performed at [facility]   Culture     Final   Value:        BLOOD CULTURE RECEIVED NO GROWTH TO DATE CULTURE WILL BE HELD FOR 5 DAYS BEFORE ISSUING A FINAL NEGATIVE REPORT     Performed at [facility]   Report Status PENDING   Incomplete  MRSA PCR SCREENING     Status: None   Collection Time    10/19/13 10:09 PM      Result Value Ref Range Status   MRSA by PCR NEGATIVE  NEGATIVE Final   Comment:            The GeneXpert MRSA Assay (FDA     approved for NASAL specimens     only), is one component of a     comprehensive MRSA colonization     surveillance program. It is not     intended to diagnose MRSA     infection nor to guide or     monitor treatment for     MRSA infections.  CULTURE, RESPIRATORY (NON-EXPECTORATED)     Status: None   Collection Time    10/20/13  4:28 PM      Result Value Ref Range Status   Specimen Description TRACHEAL ASPIRATE    Final   Special Requests NONE   Final   Gram Stain     Final   Value: ABUNDANT WBC PRESENT,BOTH PMN AND MONONUCLEAR     RARE SQUAMOUS EPITHELIAL CELLS PRESENT     NO ORGANISMS SEEN     Performed at [facility]   Culture     Final   Value: MODERATE GRAM NEGATIVE RODS     MODERATE PROTEUS MIRABILIS     Performed at [facility]   Report Status PENDING   Incomplete     Studies: Nm Pulmonary Perfusion  11-12-13   CLINICAL DATA:  Shortness of Breath  EXAM: NUCLEAR MEDICINE PERFUSION LUNG SCAN  Views: Anterior, posterior, left lateral, right lateral, RPO, LPO, RAO, LAO  Radionuclide:  Technetium 46m macroaggregated albumin  Dose:  5.5 mCi  Route of administration: Intravenous  COMPARISON:  Chest radiograph October 19, 2013  FINDINGS: There is slightly diminished uptake throughout the entire right lung compared to the left. This finding could be due to clearing effusion on the right. No segmental perfusion defects are identified on this study.  IMPRESSION: Question effusion on the right causing generalized decreased uptake on the right compared to the left. No wedge-shaped perfusion defects are identified. This study overall is felt to constitute a low probability of pulmonary embolus.   Electronically Signed   By: Bretta Bang M.D.   On: 11-12-2013 14:51   Dg Chest Port 1 View  2013/11/12   CLINICAL DATA:  Dysphagia.  Chest discomfort.  EXAM: PORTABLE CHEST - 1  VIEW  COMPARISON:  10/19/2013  FINDINGS: Increased moderate size right pleural effusion is seen. Bilateral lower lobe airspace disease is also seen which is increased since previous study. Cardiomegaly is stable.  Tracheostomy tube and left-sided Port-A-Cath remain in appropriate position.  IMPRESSION: Increased bilateral lower lobe airspace disease and moderate right pleural effusion.  Stable cardiomegaly.   Electronically Signed   By: Myles Rosenthal M.D.   On: 10/22/2013 20:39    Scheduled Meds: . antiseptic oral rinse   7 mL Mouth Rinse q12n4p  . aspirin  325 mg Oral Daily  . ceFEPime (MAXIPIME) IV  1 g Intravenous Q8H  . chlorhexidine  15 mL Mouth Rinse BID  . collagenase   Topical Daily  . enoxaparin (LOVENOX) injection  40 mg Subcutaneous QHS  . feeding supplement (RESOURCE BREEZE)  1 Container Oral BID BM  . furosemide  40 mg Intravenous Q12H  . guaiFENesin  15 mL Oral 3 times per day  . ipratropium-albuterol  3 mL Nebulization Q6H  . metoprolol tartrate  50 mg Oral BID  . polyethylene glycol  17 g Oral BID  . polyvinyl alcohol  1 drop Both Eyes QID  . sennosides  10 mL Oral BID  . sodium chloride  10-40 mL Intracatheter Q12H  . sodium chloride  3 mL Intravenous Q12H  . sucralfate  1 g Oral TID WC & HS  . venlafaxine  100 mg Oral BID  . ziprasidone  40 mg Oral Daily  . ziprasidone  80 mg Oral BID WC   Continuous Infusions: . sodium chloride 10 mL/hr at 10/24/13 0753       Time spent: 25 minutes    [facility]  [facility] Pager [phone] If 7PM-7AM, please contact night-coverage at [email or web address], password [facility] 10/24/2013, 11:06 AM  LOS: 5 days

## 2013-10-25 LAB — BASIC METABOLIC PANEL
Anion gap: 11 (ref 5–15)
BUN: 7 mg/dL (ref 6–23)
CO2: 30 meq/L (ref 19–32)
CREATININE: 0.79 mg/dL (ref 0.50–1.10)
Calcium: 8.3 mg/dL — ABNORMAL LOW (ref 8.4–10.5)
Chloride: 92 mEq/L — ABNORMAL LOW (ref 96–112)
GFR calc Af Amer: >90 mL/min (ref >90)
GFR calc non Af Amer: >90 mL/min (ref >90)
GLUCOSE: 90 mg/dL (ref 70–99)
Potassium: 3.4 mEq/L — ABNORMAL LOW (ref 3.7–5.3)
Sodium: 133 mEq/L — ABNORMAL LOW (ref 137–147)

## 2013-10-25 MED ORDER — FUROSEMIDE 10 MG/ML IJ SOLN
20.0000 mg | Freq: Two times a day (BID) | INTRAMUSCULAR | Status: DC
  Administered 2013-10-25 – 2013-10-27 (×5): 20 mg via INTRAVENOUS
  Filled 2013-10-25 (×6): qty 2

## 2013-10-25 MED ORDER — GUAIFENESIN ER 600 MG PO TB12
1200.0000 mg | ORAL_TABLET | Freq: Two times a day (BID) | ORAL | Status: DC
  Administered 2013-10-25 (×2): 1200 mg via ORAL
  Filled 2013-10-25 (×2): qty 2

## 2013-10-25 MED ORDER — POTASSIUM CHLORIDE CRYS ER 20 MEQ PO TBCR
40.0000 meq | EXTENDED_RELEASE_TABLET | Freq: Once | ORAL | Status: AC
  Administered 2013-10-25: 40 meq via ORAL
  Filled 2013-10-25: qty 2

## 2013-10-25 NOTE — Progress Notes (Signed)
No change at this time in d/c plan. Patient is a resident of [facility] with plan to return there when medically stable per MD.  CSW services will continue to follow patient and assist with d/c plan as indicated.  Lorri Frederick. Jaci Lazier, [place]  [phone]  (weekend coverage)

## 2013-10-25 NOTE — Progress Notes (Signed)
Echocardiogram 2D Echocardiogram has been performed.  [name] 10/25/2013, 9:30 AM

## 2013-10-25 NOTE — Progress Notes (Signed)
[facility] PROGRESS NOTE   [name]:[id number] DOB: 1965-06-07 DOA: 10/19/2013 PCP: Terald Sleeper, MD  HPI/Subjective: Patient seen and examined, having difficulty with coughing up phlegm.  Assessment/Plan: Principal Problem:   Sepsis Active Problems:   Tracheostomy in place   Quadriplegia   Hypoxemia   UTI (urinary tract infection)   Acute-on-chronic respiratory failure    Sepsis -Heart rate of 112, respiratory rate is 27 the presence of UTI and suspected respiratory tract infection. -Blood pressure fluctuates, likely secondary to quadriplegia, cannot rule out that it is secondary to sepsis. -Blood culture is pending and urine cultures only growing morphophytes. -Pt has history of Pseudomonas in both urine and sputum so ceftriaxone and switched to cefepime, vancomycin added. - Will discontinue vancomycin and  Cefepime. - Started on Primaxin as ESBL growing in sputum.  Acute and chronic respiratory failure -Patient has chronic respiratory failure, she does have tracheostomy set in place.  - She has thick yellow sputum from the trach, and the sputum culture is growing Gram negative rods, proteus mirabilis. - She is on cefepime, will also add Levaquin. -Patient presented with hypoxia with oxygen saturation down to 82%. -Currently on 35% oxygen through the trach collar. - CXR showed pleural effusions, will change  lasix to 20 mg  IV q 12 hrs. - Start Mucinex 1200 mg po BID - Chest PT   ? UTI -Associated with indwelling Foley catheter. -Patient initially started on ceftriaxone, antibiotic switched to cefepime and vancomycin. -Urine culture only growing contamination.  Possible HCAP -Patient has persistent LLL infiltrates, but now presented with increased secretions and hypoxia. -Obtain tracheal secretions culture, patient started on cefepime and vancomycin. - Vanc discontinued, start levaquin. -Continue oxygen, provide bronchodilators, mucolytics  and antibiotics.  Quadriplegia -Secondary to previous a spinal injury, continue frequent turns, change indwelling Foley catheter.  Abdominal pain/constipation - Constipation resolved after the SMOG enema -Provide a bowel regimen. Had old nonfunctioning pain pump in the right side of her abdominal wall (subcutaneous). - Fleet enema was ordered with no significant results --Abdominal x-rays done today, showed nonspecific bowel gas pattern and large amount of stool   Elevated d-dimer -Patient has elevated d-dimer of 1.78, also hypoxia. VQ scan ordered to rule out PE. -I think patient hypoxia, tachycardia and elevated D. dimers are secondary to sepsis. -I discontinued VQ scan, patient will be on prophylactic dose of Lovenox. - Patient again became hypoxic last night, unable to obtain CT angio as  patient refused IV contrast - V/Q scan was low probability for pulmonary embolus.  Dysphagia - Clinical swallow evaluation was performed, recommend dysphagia 3 diet and with thin  Liquid -  IR consult to remove the PEG tube  Hyperkalemia - Resolved,  - Potassium was 5.6, given 1 dose of Kayexalate 15 g by mouth x1 -  Constipation Resolved -After SMOG enema.  Code Status: Full code Family Communication: Plan discussed with patient's sister at home Disposition Plan: Remains inpatient   Consultants:  None  Procedures:  None  Antibiotics:  Ceftriaxone 9/7>> 9/8  Cefepime 9/8  Vancomycin 9/8>>9/11  levaquin 9/12   Objective: Filed Vitals:   10/25/13 1154  BP:   Pulse: 116  Temp:   Resp: 21    Intake/Output Summary (Last 24 hours) at 10/25/13 1227 Last data filed at 10/25/13 0600  Gross per 24 hour  Intake   1290 ml  Output   2000 ml  Net   -710 ml   Filed Weights   10/19/13 2200 10/24/13 0542  10/25/13 0400  Weight: 87.7 kg (193 lb 5.5 oz) 88.3 kg (194 lb 10.7 oz) 88.4 kg (194 lb 14.2 oz)    Exam: Physical Exam: Head: Normocephalic, atraumatic.  Neck:  supple,No deformities, masses, or tenderness noted.trach collar in place Lungs: Bilateral rhonchi Heart: Regular RR. S1 and S2 normal  Abdomen: BS normoactive. Soft, Nondistended, non-tender.  Extremities: No pretibial edema, no erythema   Data Reviewed: Basic Metabolic Panel:  Recent Labs Lab 10/20/13 0445 10/22/13 0400 10/23/13 0500 10/24/13 0500 10/25/13 0500  NA 139 136* 136* 135* 133*  K 3.3* 5.6* 4.8 4.3 3.4*  CL 96 100 97 96 92*  CO2 34* GLUCOSE 82 111* 86 97 90  BUN CREATININE 0.89 0.88 0.75 0.76 0.79  CALCIUM 8.5 8.3* 8.4 8.4 8.3*  MG 1.7  --   --   --   --   PHOS 4.2  --   --   --   --    Liver Function Tests:  Recent Labs Lab 10/20/13 0445  AST 6  ALT <5  ALKPHOS 66  BILITOT <0.2*  PROT 6.1  ALBUMIN 2.0*   No results found for this basename: LIPASE, AMYLASE,  in the last 168 hours No results found for this basename: AMMONIA,  in the last 168 hours CBC:  Recent Labs Lab 10/19/13 1622 10/19/13 1634 10/20/13 0445 10/24/13 0500  WBC 10.2  --  11.4* 12.3*  NEUTROABS 6.4  --   --   --   HGB 9.3* 11.2* 8.7* 8.9*  HCT 32.6* 33.0* 31.4* 31.1*  MCV 76.5*  --  78.1 75.9*  PLT 234  --  250 295   Cardiac Enzymes:  Recent Labs Lab 10/20/13 0005 10/20/13 0445 10/20/13 1000 10/20/13 1705  TROPONINI <0.30 <0.30 <0.30 <0.30   BNP (last 3 results)  Recent Labs  10/23/13 0500  PROBNP 6779.0*   CBG: No results found for this basename: GLUCAP,  in the last 168 hours  Micro Recent Results (from the past 240 hour(s))  URINE CULTURE     Status: None   Collection Time    10/19/13  3:45 PM      Result Value Ref Range Status   Specimen Description URINE, CATHETERIZED   Final   Special Requests NONE   Final   Culture  Setup Time     Final   Value: 10/20/2013 01:27     Performed at [facility] Count     Final   Value: >=100,000 COLONIES/ML     Performed at [facility]   Culture     Final    Value: Multiple bacterial morphotypes present, none predominant. Suggest appropriate recollection if clinically indicated.     Performed at [facility]   Report Status 10/21/2013 FINAL   Final  CULTURE, BLOOD (ROUTINE X 2)     Status: None   Collection Time    10/19/13  4:45 PM      Result Value Ref Range Status   Specimen Description BLOOD LEFT HAND   Final   Special Requests BOTTLES DRAWN AEROBIC AND ANAEROBIC   Final   Culture  Setup Time     Final   Value: 10/20/2013 01:27     Performed at [facility]   Culture     Final   Value:        BLOOD CULTURE RECEIVED NO GROWTH TO DATE CULTURE WILL  BE HELD FOR 5 DAYS BEFORE ISSUING A FINAL NEGATIVE REPORT     Performed at [facility]   Report Status PENDING   Incomplete  CULTURE, BLOOD (ROUTINE X 2)     Status: None   Collection Time    10/19/13  4:50 PM      Result Value Ref Range Status   Specimen Description BLOOD LEFT WRIST   Final   Special Requests BOTTLES DRAWN AEROBIC ONLY   Final   Culture  Setup Time     Final   Value: 10/20/2013 01:28     Performed at [facility]   Culture     Final   Value:        BLOOD CULTURE RECEIVED NO GROWTH TO DATE CULTURE WILL BE HELD FOR 5 DAYS BEFORE ISSUING A FINAL NEGATIVE REPORT     Performed at [facility]   Report Status PENDING   Incomplete  MRSA PCR SCREENING     Status: None   Collection Time    10/19/13 10:09 PM      Result Value Ref Range Status   MRSA by PCR NEGATIVE  NEGATIVE Final   Comment:            The GeneXpert MRSA Assay (FDA     approved for NASAL specimens     only), is one component of a     comprehensive MRSA colonization     surveillance program. It is not     intended to diagnose MRSA     infection nor to guide or     monitor treatment for     MRSA infections.  CULTURE, RESPIRATORY (NON-EXPECTORATED)     Status: None   Collection Time    10/20/13  4:28 PM      Result Value Ref Range Status   Specimen  Description TRACHEAL ASPIRATE   Final   Special Requests NONE   Final   Gram Stain     Final   Value: ABUNDANT WBC PRESENT,BOTH PMN AND MONONUCLEAR     RARE SQUAMOUS EPITHELIAL CELLS PRESENT     NO ORGANISMS SEEN     Performed at [facility]   Culture     Final   Value: MODERATE ACINETOBACTER CALCOACETICUS/BAUMANNII COMPLEX     MODERATE PROTEUS MIRABILIS     Note: Confirmed Extended Spectrum Beta-Lactamase Producer (ESBL) CRITICAL RESULT CALLED TO, READ BACK BY AND VERIFIED WITH: S. MAIN RN 1115AM 10/24/13 GUSTK     Performed at [facility]   Report Status 10/24/2013 FINAL   Final   Organism ID, Bacteria ACINETOBACTER CALCOACETICUS/BAUMANNII COMPLEX   Final   Organism ID, Bacteria PROTEUS MIRABILIS   Final     Studies: No results found.  Scheduled Meds: . antiseptic oral rinse  7 mL Mouth Rinse q12n4p  . aspirin  325 mg Oral Daily  . chlorhexidine  15 mL Mouth Rinse BID  . collagenase   Topical Daily  . enoxaparin (LOVENOX) injection  40 mg Subcutaneous QHS  . feeding supplement (RESOURCE BREEZE)  1 Container Oral BID BM  . guaiFENesin  1,200 mg Oral BID  . imipenem-cilastatin  500 mg Intravenous 4 times per day  . ipratropium-albuterol  3 mL Nebulization Q6H  . metoprolol tartrate  50 mg Oral BID  . polyethylene glycol  17 g Oral BID  . polyvinyl alcohol  1 drop Both Eyes QID  . sennosides  10 mL Oral BID  . sodium chloride  10-40 mL Intracatheter Q12H  . sodium chloride  3 mL Intravenous Q12H  . sucralfate  1 g Oral TID WC & HS  . venlafaxine  100 mg Oral BID  . ziprasidone  40 mg Oral Daily  . ziprasidone  80 mg Oral BID WC   Continuous Infusions: . sodium chloride 10 mL/hr at 10/24/13 0753       Time spent: 25 minutes    [facility]  [facility] Pager [phone] If 7PM-7AM, please contact night-coverage at [email or web address], password [email or web address] 10/25/2013, 12:27 PM  LOS: 6 days

## 2013-10-26 LAB — BASIC METABOLIC PANEL
Anion gap: 9 (ref 5–15)
BUN: 8 mg/dL (ref 6–23)
CALCIUM: 8.5 mg/dL (ref 8.4–10.5)
CO2: 31 meq/L (ref 19–32)
CREATININE: 0.74 mg/dL (ref 0.50–1.10)
Chloride: 94 mEq/L — ABNORMAL LOW (ref 96–112)
GFR calc Af Amer: >90 mL/min (ref >90)
GFR calc non Af Amer: >90 mL/min (ref >90)
GLUCOSE: 82 mg/dL (ref 70–99)
Potassium: 3.9 mEq/L (ref 3.7–5.3)
Sodium: 134 mEq/L — ABNORMAL LOW (ref 137–147)

## 2013-10-26 LAB — CULTURE, BLOOD (ROUTINE X 2)
CULTURE: NO GROWTH
Culture: NO GROWTH

## 2013-10-26 MED ORDER — AMLODIPINE BESYLATE 5 MG PO TABS
5.0000 mg | ORAL_TABLET | Freq: Every day | ORAL | Status: DC
  Administered 2013-10-26 – 2013-10-28 (×3): 5 mg via ORAL
  Filled 2013-10-26 (×3): qty 1

## 2013-10-26 MED ORDER — GUAIFENESIN 200 MG PO TABS
400.0000 mg | ORAL_TABLET | ORAL | Status: DC
Start: 2013-10-26 — End: 2013-10-28
  Administered 2013-10-26 – 2013-10-28 (×10): 400 mg via ORAL
  Filled 2013-10-26 (×27): qty 2

## 2013-10-26 MED ORDER — HYDRALAZINE HCL 25 MG PO TABS
25.0000 mg | ORAL_TABLET | Freq: Four times a day (QID) | ORAL | Status: DC | PRN
  Filled 2013-10-26: qty 1

## 2013-10-26 NOTE — Care Management Note (Addendum)
    Page 1 of 2   10/28/2013     12:27:12 PM CARE MANAGEMENT NOTE 10/28/2013  Patient:  [name], [name]   Account Number:  [id number]  Date Initiated:  10/20/2013  Documentation initiated by:  DAVIS,RHONDA  Subjective/Objective Assessment:   history of Respiratory failure; Dysphagia; Contracture of hand joint; Anemia; Neurogenic bladder; Anxiety; Schizophrenia; Quadriplegia; Hypertension; CHF (congestive heart failure); Anginal pain; Right leg DVT (1980's); Pneumonia; History     Action/Plan:   return to snf on dc   Anticipated DC Date:  10/28/2013   Anticipated DC Plan:  SKILLED NURSING FACILITY  In-house referral  Clinical Social Worker      DC Planning Services  CM consult      [facility] Choice  NA   Choice offered to / List presented to:  NA      DME agency  NA        HH agency  NA   Status of service:  Completed, signed off Medicare Important Message given?  YES (If response is "NO", the following Medicare IM given date fields will be blank) Date Medicare IM given:  10/26/2013 Medicare IM given by:  [facility] Date Additional Medicare IM given:  10/28/2013 Additional Medicare IM given by:  [facility]  Discharge Disposition:  SKILLED NURSING FACILITY  Per UR Regulation:  Reviewed for med. necessity/level of care/duration of stay  If discussed at Long Length of Stay Meetings, dates discussed:   10/27/2013    Comments:  10/28/13 Shanina Kepple RN,BSN NCM [phone] D/C SNF.  10/26/13 Sergi Gellner RN,BSN NCM [phone] TRANSFER TO 4W.IV ABX,TELE.D/C PLAN RETURN SNF.  Anola Gurney [id number]/VW from [facility]-sputum showing proteus mirabilis and gram neg rods/trach on 28% o2 vis t.collar/temp99.9/bnp 6779.0/[id number]=Increased bilateral lower lobe airspace disease and moderate right pleural effusion. left side port present and in good condition.  Goal will be to return to [facility] once bacteremia is stable.

## 2013-10-26 NOTE — Progress Notes (Signed)
ANTIBIOTIC CONSULT NOTE - FOLLOW UP  Pharmacy Consult for Primaxin Indication: Sepsis, Pneumonia  Allergies  Allergen Reactions  . Latex Other (See Comments)    Reaction unknown    Patient Measurements: Height:  (157.5 cm) Weight: 194 lb 14.2 oz (88.4 kg) IBW/kg (Calculated) : 50.1  Vital Signs: Temp: 99.4 F (37.4 C) (09/14 0800) Temp src: Oral (09/14 0800) BP: 149/78 mmHg (09/14 0749) Pulse Rate: 93 (09/14 0749) Intake/Output from previous day: 09/13 0701 - 09/14 0700 In: 640 [I.V.:240; IV Piggyback:400] Out: 4700 [Urine:4700]  Labs:  Recent Labs  10/24/13 0500 10/25/13 0500 10/26/13 0615  WBC 12.3*  --   --   HGB 8.9*  --   --   PLT 295  --   --   CREATININE 0.76 0.79 0.74   Estimated Creatinine Clearance: 88.8 ml/min (by C-G formula based on Cr of 0.74).   Anti-infectives: 9/7 Ceftriaxone x 1 9/8 >> Vancomycin >> 9/11 9/8 >> Cefepime >> 9/12 9/12 >> primaxin >>  Assessment: 47yoF with quadriplegia (SNF resident) admitted on 9/7 for SOB and clogged feeding tube. Noted to have UTI (indwelling Foley) and chronic infiltrate on CXR. Pt also with chronic trach. Experienced hypotension responding to IVF and low SpO2 improved with O2. Ceftriaxone changed to vanc/cefepime on 9/8 d/t Hx of pseudomonas in urine and sputum from 2013.  With + ESBL culture, vanc and cefepime d/c, and Pharmacy consulted to dose Primaxin.  Today, 9/14:  Day #3 Primaxin  Tmax: 99.7  WBCs: slightly elevated but stable (9/12)  Renal: SCr stable WNL. CrCl 88 ml/min   Respiratory culture with ESBL P. Mirabilis and acinetobacter (sens to imipenem)   Goal of Therapy:  Appropriate abx dosing, eradication of infection.   Plan:   Continue Primaxin 500 mg IV q6h.  Follow up renal fxn and culture results.  Lynann Beaver PharmD, BCPS Pager [phone] 10/26/2013 10:07 AM

## 2013-10-26 NOTE — Progress Notes (Signed)
[facility] PROGRESS NOTE   [name] ZOX:[id number] DOB: Aug 05, 1965 DOA: 10/19/2013 PCP: Terald Sleeper, MD  HPI/Subjective: Patient seen and examined, no complaints this morning  Assessment/Plan: Principal Problem:   Sepsis Active Problems:   Tracheostomy in place   Quadriplegia   Hypoxemia   UTI (urinary tract infection)   Acute-on-chronic respiratory failure    Sepsis -Heart rate of 112, respiratory rate is 27 the presence of UTI and suspected respiratory tract infection. -Blood pressure fluctuates, likely secondary to quadriplegia, cannot rule out that it is secondary to sepsis. -Blood culture is pending and urine cultures only growing morphophytes. -Pt has history of Pseudomonas in both urine and sputum so ceftriaxone and switched to cefepime, vancomycin added. - Will discontinue vancomycin and  Cefepime. - Started on Primaxin as ESBL growing in sputum.  Acute and chronic respiratory failure -Patient has chronic respiratory failure, she does have tracheostomy set in place.  - She has thick yellow sputum from the trach, and the sputum culture is growing Gram negative rods, proteus mirabilis. - She is on cefepime, will also add Levaquin. -Patient presented with hypoxia with oxygen saturation down to 82%. -Currently on 35% oxygen through the trach collar. - CXR showed pleural effusions, will change  lasix to 20 mg  IV q 12 hrs. - Start Mucinex 1200 mg po BID - Chest PT   ? UTI -Associated with indwelling Foley catheter. -Patient initially started on ceftriaxone, antibiotic switched to cefepime and vancomycin. -Urine culture only growing contamination.  Possible HCAP -Patient has persistent LLL infiltrates, but now presented with increased secretions and hypoxia. -Obtain tracheal secretions culture, patient started on cefepime and vancomycin. - Vanc discontinued, start levaquin. -Continue oxygen, provide bronchodilators, mucolytics and  antibiotics.  Quadriplegia -Secondary to previous a spinal injury, continue frequent turns, change indwelling Foley catheter.  Abdominal pain/constipation - Constipation resolved after the SMOG enema -Provide a bowel regimen. Had old nonfunctioning pain pump in the right side of her abdominal wall (subcutaneous). - Fleet enema was ordered with no significant results --Abdominal x-rays done today, showed nonspecific bowel gas pattern and large amount of stool   Elevated d-dimer -Patient has elevated d-dimer of 1.78, also hypoxia. VQ scan ordered to rule out PE. -I think patient hypoxia, tachycardia and elevated D. dimers are secondary to sepsis. -I discontinued VQ scan, patient will be on prophylactic dose of Lovenox. - Patient again became hypoxic last night, unable to obtain CT angio as  patient refused IV contrast - V/Q scan was low probability for pulmonary embolus.  Dysphagia - Clinical swallow evaluation was performed, recommend dysphagia 3 diet and with thin  Liquid -  IR consult to remove the PEG tube  Hyperkalemia - Resolved,  - Potassium was 5.6, given 1 dose of Kayexalate 15 g by mouth x1  Hypertension Will continue with Metoprolol 50 mg po BID and restart Amlodipine 5 mg po daily.  Constipation Resolved -After SMOG enema.  Code Status: Full code Family Communication: Plan discussed with patient's sister at home Disposition Plan: Remains inpatient   Consultants:  None  Procedures:  None  Antibiotics:  Ceftriaxone 9/7>> 9/8  Cefepime 9/8- 9/12  Vancomycin 9/8>>9/11  levaquin 9/12-9/13  Primaxin- 9/12   Objective: Filed Vitals:   10/26/13 1502  BP:   Pulse: 90  Temp:   Resp: 18    Intake/Output Summary (Last 24 hours) at 10/26/13 1652 Last data filed at 10/26/13 0857  Gross per 24 hour  Intake 454.67 ml  Output   4690 ml  Net -4235.33 ml   Filed Weights   10/19/13 2200 10/24/13 0542 10/25/13 0400  Weight: 87.7 kg (193 lb 5.5 oz)  88.3 kg (194 lb 10.7 oz) 88.4 kg (194 lb 14.2 oz)    Exam: Physical Exam: Head: Normocephalic, atraumatic.  Neck: supple,No deformities, masses, or tenderness noted.trach collar in place Lungs: Bilateral rhonchi Heart: Regular RR. S1 and S2 normal  Abdomen: BS normoactive. Soft, Nondistended, non-tender.  Extremities: No pretibial edema, no erythema   Data Reviewed: Basic Metabolic Panel:  Recent Labs Lab 10/20/13 0445 10/22/13 0400 10/23/13 0500 10/24/13 0500 10/25/13 0500 10/26/13 0615  NA 139 136* 136* 135* 133* 134*  K 3.3* 5.6* 4.8 4.3 3.4* 3.9  CL 96 100 97 96 92* 94*  CO2 34* GLUCOSE 82 111* 86 97 90 82  BUN CREATININE 0.89 0.88 0.75 0.76 0.79 0.74  CALCIUM 8.5 8.3* 8.4 8.4 8.3* 8.5  MG 1.7  --   --   --   --   --   PHOS 4.2  --   --   --   --   --    Liver Function Tests:  Recent Labs Lab 10/20/13 0445  AST 6  ALT <5  ALKPHOS 66  BILITOT <0.2*  PROT 6.1  ALBUMIN 2.0*   No results found for this basename: LIPASE, AMYLASE,  in the last 168 hours No results found for this basename: AMMONIA,  in the last 168 hours CBC:  Recent Labs Lab 10/20/13 0445 10/24/13 0500  WBC 11.4* 12.3*  HGB 8.7* 8.9*  HCT 31.4* 31.1*  MCV 78.1 75.9*  PLT 250 295   Cardiac Enzymes:  Recent Labs Lab 10/20/13 0005 10/20/13 0445 10/20/13 1000 10/20/13 1705  TROPONINI <0.30 <0.30 <0.30 <0.30   BNP (last 3 results)  Recent Labs  10/23/13 0500  PROBNP 6779.0*   CBG: No results found for this basename: GLUCAP,  in the last 168 hours  Micro Recent Results (from the past 240 hour(s))  URINE CULTURE     Status: None   Collection Time    10/19/13  3:45 PM      Result Value Ref Range Status   Specimen Description URINE, CATHETERIZED   Final   Special Requests NONE   Final   Culture  Setup Time     Final   Value: 10/20/2013 01:27     Performed at [facility] Count     Final   Value: >=100,000 COLONIES/ML      Performed at [facility]   Culture     Final   Value: Multiple bacterial morphotypes present, none predominant. Suggest appropriate recollection if clinically indicated.     Performed at [facility]   Report Status 10/21/2013 FINAL   Final  CULTURE, BLOOD (ROUTINE X 2)     Status: None   Collection Time    10/19/13  4:45 PM      Result Value Ref Range Status   Specimen Description BLOOD LEFT HAND   Final   Special Requests BOTTLES DRAWN AEROBIC AND ANAEROBIC   Final   Culture  Setup Time     Final   Value: 10/20/2013 01:27     Performed at [facility]   Culture     Final   Value: NO GROWTH 5 DAYS     Performed at [facility]   Report Status 10/26/2013 FINAL  Final  CULTURE, BLOOD (ROUTINE X 2)     Status: None   Collection Time    10/19/13  4:50 PM      Result Value Ref Range Status   Specimen Description BLOOD LEFT WRIST   Final   Special Requests BOTTLES DRAWN AEROBIC ONLY   Final   Culture  Setup Time     Final   Value: 10/20/2013 01:28     Performed at [facility]   Culture     Final   Value: NO GROWTH 5 DAYS     Performed at [facility]   Report Status 10/26/2013 FINAL   Final  MRSA PCR SCREENING     Status: None   Collection Time    10/19/13 10:09 PM      Result Value Ref Range Status   MRSA by PCR NEGATIVE  NEGATIVE Final   Comment:            The GeneXpert MRSA Assay (FDA     approved for NASAL specimens     only), is one component of a     comprehensive MRSA colonization     surveillance program. It is not     intended to diagnose MRSA     infection nor to guide or     monitor treatment for     MRSA infections.  CULTURE, RESPIRATORY (NON-EXPECTORATED)     Status: None   Collection Time    10/20/13  4:28 PM      Result Value Ref Range Status   Specimen Description TRACHEAL ASPIRATE   Final   Special Requests NONE   Final   Gram Stain     Final   Value: ABUNDANT WBC PRESENT,BOTH PMN AND  MONONUCLEAR     RARE SQUAMOUS EPITHELIAL CELLS PRESENT     NO ORGANISMS SEEN     Performed at [facility]   Culture     Final   Value: MODERATE ACINETOBACTER CALCOACETICUS/BAUMANNII COMPLEX     MODERATE PROTEUS MIRABILIS     Note: Confirmed Extended Spectrum Beta-Lactamase Producer (ESBL) CRITICAL RESULT CALLED TO, READ BACK BY AND VERIFIED WITH: S. MAIN RN 1115AM 10/24/13 GUSTK     Performed at [facility]   Report Status 10/24/2013 FINAL   Final   Organism ID, Bacteria ACINETOBACTER CALCOACETICUS/BAUMANNII COMPLEX   Final   Organism ID, Bacteria PROTEUS MIRABILIS   Final     Studies: No results found.  Scheduled Meds: . antiseptic oral rinse  7 mL Mouth Rinse q12n4p  . aspirin  325 mg Oral Daily  . chlorhexidine  15 mL Mouth Rinse BID  . collagenase   Topical Daily  . enoxaparin (LOVENOX) injection  40 mg Subcutaneous QHS  . feeding supplement (RESOURCE BREEZE)  1 Container Oral BID BM  . furosemide  20 mg Intravenous Q12H  . guaiFENesin  400 mg Oral 6 times per day  . imipenem-cilastatin  500 mg Intravenous 4 times per day  . ipratropium-albuterol  3 mL Nebulization Q6H  . metoprolol tartrate  50 mg Oral BID  . polyethylene glycol  17 g Oral BID  . polyvinyl alcohol  1 drop Both Eyes QID  . sennosides  10 mL Oral BID  . sodium chloride  10-40 mL Intracatheter Q12H  . sucralfate  1 g Oral TID WC & HS  . venlafaxine  100 mg Oral BID  . ziprasidone  40 mg Oral Daily  . ziprasidone  80 mg Oral  BID WC   Continuous Infusions: . sodium chloride 10 mL/hr at 10/24/13 0753       Time spent: 25 minutes    [facility]  [facility] Pager [phone] If 7PM-7AM, please contact night-coverage at [email or web address], password [email or web address] 10/26/2013, 4:52 PM  LOS: 7 days

## 2013-10-26 NOTE — Progress Notes (Signed)
Pt refuses the vest after several attempts to initiate.

## 2013-10-26 NOTE — Progress Notes (Signed)
Aerosolized water bottle, tubing, and trach collar changed. New ambu bag placed at bedside due to no face mask present.

## 2013-10-27 DIAGNOSIS — J9620 Acute and chronic respiratory failure, unspecified whether with hypoxia or hypercapnia: Secondary | ICD-10-CM

## 2013-10-27 DIAGNOSIS — N319 Neuromuscular dysfunction of bladder, unspecified: Secondary | ICD-10-CM

## 2013-10-27 LAB — CBC
HEMATOCRIT: 30.2 % — AB (ref 36.0–46.0)
HEMOGLOBIN: 9.1 g/dL — AB (ref 12.0–15.0)
MCH: 22 pg — ABNORMAL LOW (ref 26.0–34.0)
MCHC: 30.1 g/dL (ref 30.0–36.0)
MCV: 73.1 fL — AB (ref 78.0–100.0)
Platelets: 312 10*3/uL (ref 150–400)
RBC: 4.13 MIL/uL (ref 3.87–5.11)
RDW: 13.4 % (ref 11.5–15.5)
WBC: 13.4 10*3/uL — AB (ref 4.0–10.5)

## 2013-10-27 LAB — BASIC METABOLIC PANEL
Anion gap: 8 (ref 5–15)
BUN: 8 mg/dL (ref 6–23)
CHLORIDE: 91 meq/L — AB (ref 96–112)
CO2: 35 mEq/L — ABNORMAL HIGH (ref 19–32)
CREATININE: 0.78 mg/dL (ref 0.50–1.10)
Calcium: 8.9 mg/dL (ref 8.4–10.5)
GFR calc non Af Amer: >90 mL/min (ref >90)
Glucose, Bld: 87 mg/dL (ref 70–99)
Potassium: 3.6 mEq/L — ABNORMAL LOW (ref 3.7–5.3)
Sodium: 134 mEq/L — ABNORMAL LOW (ref 137–147)

## 2013-10-27 MED ORDER — HYDROCODONE-ACETAMINOPHEN 5-325 MG PO TABS: ORAL_TABLET | ORAL | Status: DC

## 2013-10-27 MED ORDER — OXYCODONE HCL 15 MG PO TABS: ORAL_TABLET | ORAL | Status: DC

## 2013-10-27 MED ORDER — ENSURE COMPLETE SHAKE PO LIQD: 1.0000 | Freq: Two times a day (BID) | ORAL | Status: AC

## 2013-10-27 MED ORDER — SENNOSIDES 8.8 MG/5ML PO SYRP: 10.0000 mL | ORAL_SOLUTION | Freq: Two times a day (BID) | ORAL | Status: AC

## 2013-10-27 MED ORDER — FUROSEMIDE 20 MG PO TABS: 20.0000 mg | ORAL_TABLET | Freq: Two times a day (BID) | ORAL | Status: AC

## 2013-10-27 MED ORDER — FUROSEMIDE 20 MG PO TABS
20.0000 mg | ORAL_TABLET | Freq: Two times a day (BID) | ORAL | Status: DC
  Administered 2013-10-27 – 2013-10-28 (×2): 20 mg via ORAL
  Filled 2013-10-27 (×4): qty 1

## 2013-10-27 MED ORDER — IMIPENEM-CILASTATIN 500 MG IV SOLR: 500.0000 mg | Freq: Four times a day (QID) | INTRAVENOUS | Status: AC

## 2013-10-27 NOTE — Plan of Care (Signed)
Problem: Phase I Progression Outcomes Goal: Voiding-avoid urinary catheter unless indicated Outcome: Not Applicable Date Met:  44/45/84 Chronic foley

## 2013-10-27 NOTE — Discharge Summary (Signed)
Physician Discharge Summary  [name] ZOX:[id number] DOB: 12/02/1965 DOA: 10/19/2013  PCP: Terald Sleeper, MD  Admit date: 10/19/2013 Discharge date: 10/28/2013  Time spent: **  Recommendations for Outpatient Follow-up:  1. **Follow up PCP in 2 weeks  Discharge Diagnoses:  Principal Problem:   Sepsis Active Problems:   Tracheostomy in place   Quadriplegia   Hypoxemia   UTI (urinary tract infection)   Acute-on-chronic respiratory failure   Discharge Condition: Stable  Diet recommendation: Dys 3 dietDiet recommendations: Dysphagia 3 (mechanical soft);Thin liquid  Liquids provided via: Straw  Medication Administration: Crushed with puree  Supervision: Trained caregiver to feed patient  Compensations: Small sips/bites;Slow rate  Postural Changes and/or Swallow Maneuvers: Seated upright 90 degrees;Upright 30-60 min after meal  Patient may use Passy-Muir Speech Valve: Intermittently with supervision;During all therapies with supervision;During PO intake/meals (only when secretions clear)  PMSV Supervision: Full  Oral Care Recommendations: Oral care BID  Follow up Recommendations: 24 hour supervision/assistance  Plan: Continue with current plan of care    Filed Weights   10/19/13 2200 10/24/13 0542 10/25/13 0400  Weight: 87.7 kg (193 lb 5.5 oz) 88.3 kg (194 lb 10.7 oz) 88.4 kg (194 lb 14.2 oz)    History of present illness:  48 y.o. female has a past medical history of Respiratory failure; Dysphagia; Contracture of hand joint; Anemia; Neurogenic bladder; Anxiety; Schizophrenia; Quadriplegia; Hypertension; CHF (congestive heart failure); Anginal pain; Right leg DVT (1980's); Pneumonia; History of blood transfusion; GERD (gastroesophageal reflux disease); Arthritis; Depression; Kidney stones; and On home oxygen therapy.  Presented with  Patient had been having intermittent complaints of shortness of breath while residing at a nursing home. Today she was brought in  to [facility] department because of clogged feeding tube. It would not flush. ER physician was unable to replace it or de clog it. 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 Describes chronic abdominal pain to her abdomen. Patient had had in the past recurrent urinary tract infections. UA was significant for UTI chest x-ray was worrisome for persistent infiltrate. Patient initially was noted to be hypotensive with blood pressures down 69/30 but after IV fluids it improved her blood pressure 145/70. Initial oxygen saturation was down to 82% the patient's arrival. In the past it was thought that maybe she is not appropriately ventilating due to body habitus  Hospitalist was called for admission for hypoxia transient hypotension and UTI      Hospital Course:   Sepsis , resolved Patient was admitted with sepsis due to UTI and suspected respiratory tract infection, blood cultures showed no growth urine culture showed contamination. Patient was initially started on vancomycin and ceftriaxone which was later switched to cefepime. Patient's sputum culture grew ESBL, so patient was started on Primaxin and vancomycin and cefepime were discontinued.   Plan is to continue Primaxin for 7 more days stop date 11/03/2013   Acute and chronic respiratory failure / healthcare associated pneumonia Patient developed acute on chronic respiratory failure, secondary to pneumonia. Respiratory culture grew Acinetobacter and Proteus mirabilis. Patient was started on vancomycin and cefepime and later switched to Primaxin.  ? Diastolic heart failure Patient's echo showed EF of 70% -Patient presented with hypoxia with oxygen saturation down to 82%.  -Currently on 35% oxygen through the trach collar.  - CXR showed pleural effusions, patient was started on IV Lasix and diuresed very well at this time we have changed the Lasix to 20 mg  by  mouth twice a day.    Quadriplegia  -Secondary to previous a spinal injury, continue frequent turns, continue indwelling Foley catheter  Abdominal pain/constipation  - Constipation resolved after the SMOG enema  -Provide a bowel regimen. Had old nonfunctioning pain pump in the right side of her abdominal wall (subcutaneous).  - Fleet enema was ordered with no significant results  --Abdominal x-rays done today, showed nonspecific bowel gas pattern and large amount of stool   Elevated d-dimer  -Patient had elevated d-dimer of 1.78, also hypoxia. VQ scan ordered to rule out PE.  - Patient again became hypoxic last night, unable to obtain CT angio as patient refused IV contrast  - V/Q scan was low probability for pulmonary embolus.   Dysphagia  - Clinical swallow evaluation was performed, recommend dysphagia 3 diet and with thin Liquid  - As patient was taking by mouth, she wanted to remove the PEG tube. PEG tube was removed by interventional radiology.   Hypertension  Will continue with Metoprolol 50 mg po BID and restart Amlodipine 5 mg po daily.   Constipation  Resolved  -After SMOG enema.  Procedures:  Echocardiogram   Consultations:  None   Discharge Exam: Filed Vitals:   10/27/13 1430  BP: 142/80  Pulse: 82  Temp: 98.9 F (37.2 C)  Resp: 18    General: appears in no acute distress  Cardiovascular: S1-S2 regular Respiratory: Clear bilaterally   Discharge Instructions You were cared for by a hospitalist during your hospital stay. If you have any questions about your discharge medications or the care you received while you were in the hospital after you are discharged, you can call the unit and asked to speak with the hospitalist on call if the hospitalist that took care of you is not available. Once you are discharged, your primary care physician will handle any further medical issues. Please note that NO REFILLS for any discharge medications will be authorized  once you are discharged, as it is imperative that you return to your primary care physician (or establish a relationship with a primary care physician if you do not have one) for your aftercare needs so that they can reassess your need for medications and monitor your lab values.  Discharge Instructions   Discharge instructions    Complete by:  As directed   Diet recommendations: Dysphagia 3 (mechanical soft);Thin liquid Liquids provided via: Straw Medication Administration: Crushed with puree Supervision: Trained caregiver to feed patient Compensations: Small sips/bites;Slow rate Postural Changes and/or Swallow Maneuvers: Seated upright 90 degrees;Upright 30-60 min after meal          Patient may use Passy-Muir Speech Valve: Intermittently with supervision;During all therapies with supervision;During PO intake/meals (only when secretions clear) PMSV Supervision: Full            Oral Care Recommendations: Oral care BID Follow up Recommendations: 24 hour supervision/assistance Plan: Continue with current plan of care          Current Discharge Medication List    START taking these medications   Details  imipenem-cilastatin 500 mg in sodium chloride 0.9 % 100 mL Inject 500 mg into the vein every 6 (six) hours. Qty: 2 Bottle, Refills: 0    Nutritional Supplements (ENSURE COMPLETE SHAKE) LIQD Take 1 Can by mouth 2 (two) times daily. Qty: 1 Bottle, Refills: 1    sennosides (SENOKOT) 8.8 MG/5ML syrup Take 10 mLs by mouth 2 (two) times daily. Qty: 240 mL, Refills: 0  CONTINUE these medications which have CHANGED   Details  furosemide (LASIX) 20 MG tablet Take 1 tablet (20 mg total) by mouth 2 (two) times daily. Qty: 30 tablet    HYDROcodone-acetaminophen (NORCO/VICODIN) 5-325 MG per tablet Take one tablet by mouth every 4 hours as needed for pain Qty: 30 tablet, Refills: 0    oxyCODONE (ROXICODONE) 15 MG immediate release tablet Take one tablet by mouth twice daily after  breakfast and at 2pm; Take one tablet by mouth every 4 hours as needed for pain Qty: 30 tablet, Refills: 0      CONTINUE these medications which have NOT CHANGED   Details  acetaminophen (TYLENOL) 500 MG tablet Take 1,000 mg by mouth 3 (three) times daily.    albuterol (PROVENTIL) (2.5 MG/3ML) 0.083% nebulizer solution Take 2.5 mg by nebulization every 6 (six) hours.    amLODipine (NORVASC) 5 MG tablet Take 5 mg by mouth daily.    aspirin 325 MG tablet Take 325 mg by mouth daily.    bisacodyl (DULCOLAX) 10 MG suppository Place 10 mg rectally every other day as needed for constipation.    calcium-vitamin D (OSCAL 500/200 D-3) 500-200 MG-UNIT per tablet Take 1 tablet by mouth every morning.    docusate sodium (COLACE) 100 MG capsule Take 100 mg by mouth 2 (two) times daily.    guaiFENesin (ROBITUSSIN) 100 MG/5ML SOLN Take 15 mLs by mouth every 8 (eight) hours. scheduled    ipratropium (ATROVENT) 0.02 % nebulizer solution Take 500 mcg by nebulization every 6 (six) hours.    magnesium hydroxide (MILK OF MAGNESIA) 400 MG/5ML suspension Take 30 mLs by mouth every other day as needed for mild constipation.    metoprolol tartrate (LOPRESSOR) 25 MG tablet Take 50 mg by mouth 2 (two) times daily.     pantoprazole sodium (PROTONIX) 40 mg/20 mL PACK Take 40 mg by mouth daily.    polyethylene glycol (MIRALAX / GLYCOLAX) packet Take 17 g by mouth daily. constipation    Polyvinyl Alcohol-Povidone (FRESHKOTE) 2.7-2 % SOLN Place 1 drop into both eyes 4 (four) times daily.    sucralfate (CARAFATE) 1 G tablet Take 1 tablet (1 g total) by mouth 4 (four) times daily -  with meals and at bedtime.    venlafaxine (EFFEXOR) 100 MG tablet Take 100 mg by mouth 2 (two) times daily.    !! ziprasidone (GEODON) 40 MG capsule Take 40 mg by mouth daily at 12 noon.    !! ziprasidone (GEODON) 80 MG capsule Take 80 mg by mouth 2 (two) times daily with a meal.     !! - Potential duplicate medications found.  Please discuss with provider.     Allergies  Allergen Reactions  . Latex Other (See Comments)    Reaction unknown      The results of significant diagnostics from this hospitalization (including imaging, microbiology, ancillary and laboratory) are listed below for reference.    Significant Diagnostic Studies: Nm Pulmonary Perfusion  11/21/2013   CLINICAL DATA:  Shortness of Breath  EXAM: NUCLEAR MEDICINE PERFUSION LUNG SCAN  Views: Anterior, posterior, left lateral, right lateral, RPO, LPO, RAO, LAO  Radionuclide:  Technetium 34m macroaggregated albumin  Dose:  5.5 mCi  Route of administration: Intravenous  COMPARISON:  Chest radiograph October 19, 2013  FINDINGS: There is slightly diminished uptake throughout the entire right lung compared to the left. This finding could be due to clearing effusion on the right. No segmental perfusion defects are identified on this study.  IMPRESSION:  Question effusion on the right causing generalized decreased uptake on the right compared to the left. No wedge-shaped perfusion defects are identified. This study overall is felt to constitute a low probability of pulmonary embolus.   Electronically Signed   By: Bretta Bang M.D.   On: 10/22/2013 14:51   Dg Chest Port 1 View  10/22/2013   CLINICAL DATA:  Dysphagia.  Chest discomfort.  EXAM: PORTABLE CHEST - 1 VIEW  COMPARISON:  10/19/2013  FINDINGS: Increased moderate size right pleural effusion is seen. Bilateral lower lobe airspace disease is also seen which is increased since previous study. Cardiomegaly is stable.  Tracheostomy tube and left-sided Port-A-Cath remain in appropriate position.  IMPRESSION: Increased bilateral lower lobe airspace disease and moderate right pleural effusion.  Stable cardiomegaly.   Electronically Signed   By: Myles Rosenthal M.D.   On: 10/22/2013 20:39   Dg Abd 2 Views  10/22/2013   CLINICAL DATA:  Abdominal distension.  EXAM: ABDOMEN - 2 VIEW  COMPARISON:  10/19/2013  FINDINGS:  Left lateral decubitus images were obtained. No evidence for free air. Difficult to exclude right pleural fluid. Again noted is a spinal stimulator device. There is a large amount of stool throughout the abdomen. Nonspecific bowel gas pattern. Degenerative changes in both hips, particularly on the right side.  IMPRESSION: Nonspecific bowel gas pattern. Large amount of stool in the abdomen.   Electronically Signed   By: Richarda Overlie M.D.   On: 10/22/2013 09:23   Dg Abd Acute W/chest  10/19/2013   CLINICAL DATA:  Abdominal pain, quadriplegic  EXAM: ACUTE ABDOMEN SERIES (ABDOMEN 2 VIEW & CHEST 1 VIEW)  COMPARISON:  [facility] abdomen[facility]pelvis [facility]  FINDINGS: There is no evidence of dilated bowel loops or free intraperitoneal air. No radiopaque calculi or other significant radiographic abnormality is seen. Heart size is moderately enlarged, unchanged. Stable left lower lobe opacity is again identified. The spinal stimulator projects over the right abdomen. Tracheostomy tube is noted. Cervical fusion hardware partly visualized. Left-sided Port-A-Cath terminates over the mid SVC. The patient could not be appropriately positioned due to immobility. Left hip degenerative change again noted. Feeding tube in place.  IMPRESSION: Nonobstructive bowel gas pattern.  Persistent left lower lobe consolidation, possibly atelectasis although pneumonia could appear similar.   Electronically Signed   By: Christiana Pellant M.D.   On: 10/19/2013 17:37    Microbiology: Recent Results (from the past 240 hour(s))  URINE CULTURE     Status: None   Collection Time    10/19/13  3:45 PM      Result Value Ref Range Status   Specimen Description URINE, CATHETERIZED   Final   Special Requests NONE   Final   Culture  Setup Time     Final   Value: 10/20/2013 01:27     Performed at [facility] Count     Final   Value: >=100,000 COLONIES/ML     Performed at [facility]   Culture     Final    Value: Multiple bacterial morphotypes present, none predominant. Suggest appropriate recollection if clinically indicated.     Performed at [facility]   Report Status 10/21/2013 FINAL   Final  CULTURE, BLOOD (ROUTINE X 2)     Status: None   Collection Time    10/19/13  4:45 PM      Result Value Ref Range Status   Specimen Description BLOOD LEFT HAND   Final  Special Requests BOTTLES DRAWN AEROBIC AND ANAEROBIC   Final   Culture  Setup Time     Final   Value: 10/20/2013 01:27     Performed at [facility]   Culture     Final   Value: NO GROWTH 5 DAYS     Performed at [facility]   Report Status 10/26/2013 FINAL   Final  CULTURE, BLOOD (ROUTINE X 2)     Status: None   Collection Time    10/19/13  4:50 PM      Result Value Ref Range Status   Specimen Description BLOOD LEFT WRIST   Final   Special Requests BOTTLES DRAWN AEROBIC ONLY   Final   Culture  Setup Time     Final   Value: 10/20/2013 01:28     Performed at [facility]   Culture     Final   Value: NO GROWTH 5 DAYS     Performed at [facility]   Report Status 10/26/2013 FINAL   Final  MRSA PCR SCREENING     Status: None   Collection Time    10/19/13 10:09 PM      Result Value Ref Range Status   MRSA by PCR NEGATIVE  NEGATIVE Final   Comment:            The GeneXpert MRSA Assay (FDA     approved for NASAL specimens     only), is one component of a     comprehensive MRSA colonization     surveillance program. It is not     intended to diagnose MRSA     infection nor to guide or     monitor treatment for     MRSA infections.  CULTURE, RESPIRATORY (NON-EXPECTORATED)     Status: None   Collection Time    10/20/13  4:28 PM      Result Value Ref Range Status   Specimen Description TRACHEAL ASPIRATE   Final   Special Requests NONE   Final   Gram Stain     Final   Value: ABUNDANT WBC PRESENT,BOTH PMN AND MONONUCLEAR     RARE SQUAMOUS EPITHELIAL CELLS PRESENT     NO  ORGANISMS SEEN     Performed at [facility]   Culture     Final   Value: MODERATE ACINETOBACTER CALCOACETICUS/BAUMANNII COMPLEX     MODERATE PROTEUS MIRABILIS     Note: Confirmed Extended Spectrum Beta-Lactamase Producer (ESBL) CRITICAL RESULT CALLED TO, READ BACK BY AND VERIFIED WITH: S. MAIN RN [id number] 10/24/13 GUSTK     Performed at [facility]   Report Status 10/24/2013 FINAL   Final   Organism ID, Bacteria ACINETOBACTER CALCOACETICUS/BAUMANNII COMPLEX   Final   Organism ID, Bacteria PROTEUS MIRABILIS   Final     Labs: Basic Metabolic Panel:  Recent Labs Lab 10/23/13 0500 10/24/13 0500 10/25/13 0500 10/26/13 0615 10/27/13 0535  NA 136* 135* 133* 134* 134*  K 4.8 4.3 3.4* 3.9 3.6*  CL 97 96 92* 94* 91*  CO2 35*  GLUCOSE 86 97 90 82 87  BUN CREATININE 0.75 0.76 0.79 0.74 0.78  CALCIUM 8.4 8.4 8.3* 8.5 8.9   Liver Function Tests: No results found for this basename: AST, ALT, ALKPHOS, BILITOT, PROT, ALBUMIN,  in the last 168 hours No results found for this basename: LIPASE, AMYLASE,  in the last 168 hours No  results found for this basename: AMMONIA,  in the last 168 hours CBC:  Recent Labs Lab 10/24/13 0500 10/27/13 0535  WBC 12.3* 13.4*  HGB 8.9* 9.1*  HCT 31.1* 30.2*  MCV 75.9* 73.1*  PLT 295 312   Cardiac Enzymes: No results found for this basename: CKTOTAL, CKMB, CKMBINDEX, TROPONINI,  in the last 168 hours BNP: BNP (last 3 results)  Recent Labs  10/23/13 0500  PROBNP 6779.0*   CBG: No results found for this basename: GLUCAP,  in the last 168 hours     Signed:  Nyashia Raney S  [facility] 10/27/2013, 5:44 PM

## 2013-10-27 NOTE — Progress Notes (Signed)
Speech Language Pathology Treatment: Dysphagia  Patient Details Name: [name] MRN: [id number] DOB: [birth year] Today's Date: 10/27/2013 Time: 1007-1050 SLP Time Calculation (min): 43 min  Assessment / Plan / Recommendation Clinical Impression  Pt educated to aspiration precautions and strategies to mitigate risk.  She expressed desire for regular/thin diet today.  SLP provided oral care, brushed dentures and placed in oral cavity.  Observed pt consuming medicine crushed with applesauce, water, and graham cracker. Delayed cough noted after intake - pt reported was due to cracker and her not using PMSV.  Advised pt to use caution with use of PMSV currently due to amount of secretions and risk for mucus plugging - she agreed.  Note pt refusing chest PT stating she is "tired".  Suspect pt's secretions are contributing to her issues/dysphagia.      Pt agreeable to continue modified diet *dys3/thin with strict aspiration precautions.   Recommend follow up at SNF *pt states she is to dc tomorrow* for dysphagia/PMSV management.    SLP placed soft call bell beside pt's head on right and she demonstrated adequate usage without accidentally calling.  Advised RN.     HPI HPI: 48 year old female admitted 10/21/13 due to SOB, sepsis. PMH significant for chronic respiratory failure, trach/PMSV (2011), quadriplegia, recurrent PNA, GERD, PEG placement, recurrent UTI. Pt requiring increased suctioning to clear secretions. Variable po intake    Pertinent Vitals Pain Assessment: No/denies pain  SLP Plan  Continue with current plan of care    Recommendations Diet recommendations: Dysphagia 3 (mechanical soft);Thin liquid Liquids provided via: Straw Medication Administration: Crushed with puree Supervision: Trained caregiver to feed patient Compensations: Small sips/bites;Slow rate Postural Changes and/or Swallow Maneuvers: Seated upright 90 degrees;Upright 30-60 min after meal      Patient may use  Passy-Muir Speech Valve: Intermittently with supervision;During all therapies with supervision;During PO intake/meals (only when secretions clear) PMSV Supervision: Full       Oral Care Recommendations: Oral care BID Follow up Recommendations: 24 hour supervision/assistance Plan: Continue with current plan of care    GO     Mills Koller, MS [facility] SLP [phone]

## 2013-10-27 NOTE — Progress Notes (Signed)
NUTRITION FOLLOW UP  Intervention:   -Recommend Ensure Complete BID -Discontinue Resource Breeze -Encouraged PO intake and compliance of Dys3 diet textures -RD to follow  Nutrition Dx:   Inadequate oral intake related to clear liquid diet as evidenced by diet order; progressing with diet advancement    Goal:   Advance diet as tolerated to low sodium diet; progressing  Monitor:   Total protein/energy intake, labs, weights, GI profile, skin integrity  Assessment:   9/08: - Met with pt who reports she was on a regular/thin liquid diet at [facility] and had a good appetite and was eating 3 meals/day  - Reports she had not used PEG for a long time for feeding, was just getting water put through it and was taking medications orally  - Pt denied any problems chewing or swallowing  - Reports losing 100 pounds in the past 8 months due to not eating well however weight trend shows pt's weight down 27 pounds in the past 15 months  - RN reports pt did well with coke and applesauce  - Per RN, PEG still clogged today  Potassium slightly low, getting oral replacement  Magnesium and phosphorus WNL  9/15:  -Pt with good appetite, consuming 75% of meals. Reported she would prefer to be on regular diet textures, but understands and is compliant with SLP recommendations for Dys3 diet -Pt does not like Resource Breeze supplements, will d/c'd. Pt reported she used to consume 1-2 Ensure Complete daily. Will order for assistance with wound healing/skin integrity. WOC evaluation on 9/09 reported with pt full thickness pressure ulcer on right ischial tuberosity w/newly developed eschar -MD noted plan for PEG tube to be removed -Constipation resolved post SMOG enema  Height: Ht Readings from Last 1 Encounters:  10/19/13 5' 2"  (1.575 m)    Weight Status:   Wt Readings from Last 1 Encounters:  10/25/13 194 lb 14.2 oz (88.4 kg)    Re-estimated needs:  Kcal: 1500-1700  Protein: 80-95g  Fluid:  1.5-1.7L/day    Skin: Unstageable pressure ulcer on left ischial tuberosity  Diet Order: Dysphagia 3    Intake/Output Summary (Last 24 hours) at 10/27/13 1218 Last data filed at 10/27/13 0920  Gross per 24 hour  Intake   1174 ml  Output   3800 ml  Net  -2626 ml    Last BM: 9/15   Labs:   Recent Labs Lab 10/25/13 0500 10/26/13 0615 10/27/13 0535  NA 133* 134* 134*  K 3.4* 3.9 3.6*  CL 92* 94* 91*  CO2 30 31 35*  BUN 7 8 8   CREATININE 0.79 0.74 0.78  CALCIUM 8.3* 8.5 8.9  GLUCOSE 90 82 87    CBG (last 3)  No results found for this basename: GLUCAP,  in the last 72 hours  Scheduled Meds: . amLODipine  5 mg Oral Daily  . antiseptic oral rinse  7 mL Mouth Rinse q12n4p  . aspirin  325 mg Oral Daily  . chlorhexidine  15 mL Mouth Rinse BID  . collagenase   Topical Daily  . enoxaparin (LOVENOX) injection  40 mg Subcutaneous QHS  . feeding supplement (RESOURCE BREEZE)  1 Container Oral BID BM  . furosemide  20 mg Intravenous Q12H  . guaiFENesin  400 mg Oral 6 times per day  . imipenem-cilastatin  500 mg Intravenous 4 times per day  . ipratropium-albuterol  3 mL Nebulization Q6H  . metoprolol tartrate  50 mg Oral BID  . polyethylene glycol  17 g  Oral BID  . polyvinyl alcohol  1 drop Both Eyes QID  . sennosides  10 mL Oral BID  . sodium chloride  10-40 mL Intracatheter Q12H  . sucralfate  1 g Oral TID WC & HS  . venlafaxine  100 mg Oral BID  . ziprasidone  40 mg Oral Daily  . ziprasidone  80 mg Oral BID WC    Continuous Infusions: . sodium chloride 10 mL/hr at 10/27/13 [place] LDN Clinical Dietitian [phone]

## 2013-10-28 DIAGNOSIS — A419 Sepsis, unspecified organism: Secondary | ICD-10-CM

## 2013-10-28 LAB — BASIC METABOLIC PANEL
Anion gap: 8 (ref 5–15)
BUN: 9 mg/dL (ref 6–23)
CO2: 35 meq/L — AB (ref 19–32)
CREATININE: 0.72 mg/dL (ref 0.50–1.10)
Calcium: 8.7 mg/dL (ref 8.4–10.5)
Chloride: 93 mEq/L — ABNORMAL LOW (ref 96–112)
GFR calc non Af Amer: >90 mL/min (ref >90)
Glucose, Bld: 89 mg/dL (ref 70–99)
Potassium: 3.1 mEq/L — ABNORMAL LOW (ref 3.7–5.3)
Sodium: 136 mEq/L — ABNORMAL LOW (ref 137–147)

## 2013-10-28 LAB — MAGNESIUM: Magnesium: 1.7 mg/dL (ref 1.5–2.5)

## 2013-10-28 MED ORDER — HEPARIN SOD (PORK) LOCK FLUSH 100 UNIT/ML IV SOLN
500.0000 [IU] | INTRAVENOUS | Status: AC | PRN
  Administered 2013-10-28: 500 [IU]

## 2013-10-28 NOTE — Progress Notes (Signed)
Pt d/c to [facility], pt is stable, pt was transport via [facility].

## 2013-10-28 NOTE — Progress Notes (Signed)
Patient is set to discharge back to [facility] today. Patient & sister, Adela Lank aware. Discharge packet in [place] - RN, Ene aware. [facility] called for transport.   Lincoln Maxin, LCSW [facility] Clinical Social Worker cell #: [phone]

## 2013-10-28 NOTE — Progress Notes (Signed)
[facility] PROGRESS NOTE   [name] ZOX:[id number] DOB: 1965-12-19 DOA: 10/19/2013 PCP: Terald Sleeper, MD  HPI/Subjective: Denies any complaints  Assessment/Plan: Principal Problem:   Sepsis Active Problems:   Tracheostomy in place   Quadriplegia   Hypoxemia   UTI (urinary tract infection)   Acute-on-chronic respiratory failure    Sepsis -Heart rate of 112, respiratory rate is 27 the presence of UTI and suspected respiratory tract infection. -Blood pressure fluctuates, likely secondary to quadriplegia, cannot rule out that it is secondary to sepsis. -Blood culture is pending and urine cultures only growing morphophytes. -Pt has history of Pseudomonas in both urine and sputum so ceftriaxone and switched to cefepime, vancomycin added. -Patient is on Primaxin for ESBL Proteus in the sputum.  Acute and chronic respiratory failure -Patient has chronic respiratory failure, she does have tracheostomy set in place.  - She has thick yellow sputum from the trach, and the sputum culture is growing Gram negative rods, proteus mirabilis. - She is on cefepime, will also add Levaquin. -Patient presented with hypoxia with oxygen saturation down to 82%. -Currently on 35% oxygen through the trach collar. - CXR showed pleural effusions, will change  lasix to 20 mg  IV q 12 hrs. - Start Mucinex 1200 mg po BID - Chest PT  ? UTI -Associated with indwelling Foley catheter. -Urine culture showed poly-morphotypes  Possible HCAP -Patient has persistent LLL infiltrates, but now presented with increased secretions and hypoxia. -Obtain tracheal secretions culture, patient started on cefepime and vancomycin. - Vanc discontinued, start levaquin. -Continue oxygen, provide bronchodilators, mucolytics and antibiotics.  Quadriplegia -Secondary to previous a spinal injury, continue frequent turns, change indwelling Foley catheter.  Abdominal pain/constipation - Constipation  resolved after the SMOG enema -Provide a bowel regimen. Had old nonfunctioning pain pump in the right side of her abdominal wall (subcutaneous). - Fleet enema was ordered with no significant results --Abdominal x-rays done today, showed nonspecific bowel gas pattern and large amount of stool   Elevated d-dimer -Patient has elevated d-dimer of 1.78, also hypoxia. VQ scan ordered to rule out PE. -I think patient hypoxia, tachycardia and elevated D. dimers are secondary to sepsis. -I discontinued VQ scan, patient will be on prophylactic dose of Lovenox. -Multiple episodes of hypoxia, unable to obtain CT angio as  patient refused IV contrast -V/Q scan was low probability for pulmonary embolus.  Dysphagia -Clinical swallow evaluation was performed, recommend dysphagia 3 diet and with thin  Liquid -IR consult to remove the PEG tube  Hyperkalemia - Resolved,  - Potassium was 5.6, given 1 dose of Kayexalate 15 g by mouth x1  Hypertension Will continue with Metoprolol 50 mg po BID and restart Amlodipine 5 mg po daily.  Constipation Resolved -After SMOG enema.  Code Status: Full code Family Communication: Plan discussed with patient's sister at home Disposition Plan: Remains inpatient   Consultants:  None  Procedures:  None  Antibiotics:  Ceftriaxone 9/7>> 9/8  Cefepime 9/8- 9/12  Vancomycin 9/8>>9/11  levaquin 9/12-9/13  Primaxin- 9/12   Objective: Filed Vitals:   10/28/13 0735  BP:   Pulse: 87  Temp:   Resp: 18    Intake/Output Summary (Last 24 hours) at 10/28/13 1056 Last data filed at 10/28/13 0655  Gross per 24 hour  Intake   1960 ml  Output   2700 ml  Net   -740 ml   Filed Weights   10/19/13 2200 10/24/13 0542 10/25/13 0400  Weight: 87.7 kg (193 lb 5.5 oz) 88.3 kg (194  lb 10.7 oz) 88.4 kg (194 lb 14.2 oz)    Exam: Physical Exam: Head: Normocephalic, atraumatic.  Neck: supple,No deformities, masses, or tenderness noted.trach collar in  place Lungs: Bilateral rhonchi Heart: Regular RR. S1 and S2 normal  Abdomen: BS normoactive. Soft, Nondistended, non-tender.  Extremities: No pretibial edema, no erythema   Data Reviewed: Basic Metabolic Panel:  Recent Labs Lab 10/24/13 0500 10/25/13 0500 10/26/13 0615 10/27/13 0535 10/28/13 0500  NA 135* 133* 134* 134* 136*  K 4.3 3.4* 3.9 3.6* 3.1*  CL 96 92* 94* 91* 93*  CO2 35* 35*  GLUCOSE 97 90 82 87 89  BUN CREATININE 0.76 0.79 0.74 0.78 0.72  CALCIUM 8.4 8.3* 8.5 8.9 8.7  MG  --   --   --   --  1.7   Liver Function Tests: No results found for this basename: AST, ALT, ALKPHOS, BILITOT, PROT, ALBUMIN,  in the last 168 hours No results found for this basename: LIPASE, AMYLASE,  in the last 168 hours No results found for this basename: AMMONIA,  in the last 168 hours CBC:  Recent Labs Lab 10/24/13 0500 10/27/13 0535  WBC 12.3* 13.4*  HGB 8.9* 9.1*  HCT 31.1* 30.2*  MCV 75.9* 73.1*  PLT 295 312   Cardiac Enzymes: No results found for this basename: CKTOTAL, CKMB, CKMBINDEX, TROPONINI,  in the last 168 hours BNP (last 3 results)  Recent Labs  10/23/13 0500  PROBNP 6779.0*   CBG: No results found for this basename: GLUCAP,  in the last 168 hours  Micro Recent Results (from the past 240 hour(s))  URINE CULTURE     Status: None   Collection Time    10/19/13  3:45 PM      Result Value Ref Range Status   Specimen Description URINE, CATHETERIZED   Final   Special Requests NONE   Final   Culture  Setup Time     Final   Value: 10/20/2013 01:27     Performed at [facility] Count     Final   Value: >=100,000 COLONIES/ML     Performed at [facility]   Culture     Final   Value: Multiple bacterial morphotypes present, none predominant. Suggest appropriate recollection if clinically indicated.     Performed at [facility]   Report Status 10/21/2013 FINAL   Final  CULTURE, BLOOD (ROUTINE X 2)      Status: None   Collection Time    10/19/13  4:45 PM      Result Value Ref Range Status   Specimen Description BLOOD LEFT HAND   Final   Special Requests BOTTLES DRAWN AEROBIC AND ANAEROBIC   Final   Culture  Setup Time     Final   Value: 10/20/2013 01:27     Performed at [facility]   Culture     Final   Value: NO GROWTH 5 DAYS     Performed at [facility]   Report Status 10/26/2013 FINAL   Final  CULTURE, BLOOD (ROUTINE X 2)     Status: None   Collection Time    10/19/13  4:50 PM      Result Value Ref Range Status   Specimen Description BLOOD LEFT WRIST   Final   Special Requests BOTTLES DRAWN AEROBIC ONLY   Final   Culture  Setup Time     Final  Value: 10/20/2013 01:28     Performed at [facility]   Culture     Final   Value: NO GROWTH 5 DAYS     Performed at [facility]   Report Status 10/26/2013 FINAL   Final  MRSA PCR SCREENING     Status: None   Collection Time    10/19/13 10:09 PM      Result Value Ref Range Status   MRSA by PCR NEGATIVE  NEGATIVE Final   Comment:            The GeneXpert MRSA Assay (FDA     approved for NASAL specimens     only), is one component of a     comprehensive MRSA colonization     surveillance program. It is not     intended to diagnose MRSA     infection nor to guide or     monitor treatment for     MRSA infections.  CULTURE, RESPIRATORY (NON-EXPECTORATED)     Status: None   Collection Time    10/20/13  4:28 PM      Result Value Ref Range Status   Specimen Description TRACHEAL ASPIRATE   Final   Special Requests NONE   Final   Gram Stain     Final   Value: ABUNDANT WBC PRESENT,BOTH PMN AND MONONUCLEAR     RARE SQUAMOUS EPITHELIAL CELLS PRESENT     NO ORGANISMS SEEN     Performed at [facility]   Culture     Final   Value: MODERATE ACINETOBACTER CALCOACETICUS/BAUMANNII COMPLEX     MODERATE PROTEUS MIRABILIS     Note: Confirmed Extended Spectrum Beta-Lactamase Producer (ESBL)  CRITICAL RESULT CALLED TO, READ BACK BY AND VERIFIED WITH: S. MAIN RN [id number] 10/24/13 GUSTK     Performed at [facility]   Report Status 10/24/2013 FINAL   Final   Organism ID, Bacteria ACINETOBACTER CALCOACETICUS/BAUMANNII COMPLEX   Final   Organism ID, Bacteria PROTEUS MIRABILIS   Final     Studies: No results found.  Scheduled Meds: . amLODipine  5 mg Oral Daily  . antiseptic oral rinse  7 mL Mouth Rinse q12n4p  . aspirin  325 mg Oral Daily  . chlorhexidine  15 mL Mouth Rinse BID  . collagenase   Topical Daily  . enoxaparin (LOVENOX) injection  40 mg Subcutaneous QHS  . feeding supplement (RESOURCE BREEZE)  1 Container Oral BID BM  . furosemide  20 mg Oral BID  . guaiFENesin  400 mg Oral 6 times per day  . imipenem-cilastatin  500 mg Intravenous 4 times per day  . ipratropium-albuterol  3 mL Nebulization Q6H  . metoprolol tartrate  50 mg Oral BID  . polyethylene glycol  17 g Oral BID  . polyvinyl alcohol  1 drop Both Eyes QID  . sennosides  10 mL Oral BID  . sodium chloride  10-40 mL Intracatheter Q12H  . sucralfate  1 g Oral TID WC & HS  . venlafaxine  100 mg Oral BID  . ziprasidone  40 mg Oral Daily  . ziprasidone  80 mg Oral BID WC   Continuous Infusions: . sodium chloride 10 mL/hr at 10/27/13 0535       Time spent: 25 minutes    [facility] A  [facility] Pager [phone] If 7PM-7AM, please contact night-coverage at [email or web address], password [facility] 10/28/2013, 10:56 AM  LOS: 9 days

## 2013-10-28 NOTE — Progress Notes (Signed)
Pt FiO2 increased to 35% at 8 lpm due to repeated desaturations after tracheal suctioning and lavaging and sliding the Pt up in the bed.

## 2013-10-28 NOTE — Progress Notes (Signed)
Notified by [facility] at 2120 that pt had a run of SVT, non-sustained; pt sleeping at the time. Vital signs stable. Hospitalist notified and orders given for lab work.

## 2013-10-28 NOTE — Progress Notes (Signed)
Attempted 3times to call to give report to nurse at [facility], was placed on hold for several times. Called back and left my call back number with Rene Kocher and informed her to tell nurse at [facility] to call me when he/she is ready for report. Transport notified by [employer]

## 2013-10-29 ENCOUNTER — Other Ambulatory Visit: Payer: Self-pay | Admitting: *Deleted

## 2013-10-29 MED ORDER — OXYCODONE HCL 15 MG PO TABS: ORAL_TABLET | ORAL | Status: AC

## 2013-10-29 MED ORDER — HYDROCODONE-ACETAMINOPHEN 5-325 MG PO TABS: ORAL_TABLET | ORAL | Status: AC

## 2013-10-29 NOTE — Telephone Encounter (Signed)
[facility] 

## 2013-10-30 ENCOUNTER — Non-Acute Institutional Stay (SKILLED_NURSING_FACILITY): Payer: Medicare Other | Admitting: Internal Medicine

## 2013-10-30 DIAGNOSIS — I10 Essential (primary) hypertension: Secondary | ICD-10-CM

## 2013-10-30 DIAGNOSIS — J9610 Chronic respiratory failure, unspecified whether with hypoxia or hypercapnia: Secondary | ICD-10-CM

## 2013-10-30 DIAGNOSIS — N390 Urinary tract infection, site not specified: Secondary | ICD-10-CM

## 2013-10-30 DIAGNOSIS — J189 Pneumonia, unspecified organism: Secondary | ICD-10-CM

## 2013-10-31 NOTE — Progress Notes (Signed)
HISTORY & PHYSICAL  DATE: 10/30/2013   FACILITY: [facility]  LEVEL OF CARE: SNF (31)  ALLERGIES:  Allergies  Allergen Reactions  . Latex Other (See Comments)    Reaction unknown    CHIEF COMPLAINT:  Manage UTI, pneumonia and hypertension  HISTORY OF PRESENT ILLNESS: 48 year old African American female was hospitalized secondary to sepsis due to UTI and pneumonia. After a hospitalization she is readmitted back to the facility for long-term care management.  PNEUMONIA: The pneumonia remains stable.  The patient denies ongoing chest pain, cough, shortness of breath, fever, chills or night sweats. No complications reported from the current antibiotic being used. Respiratory culture grew Acinetobacter and Proteus mirabilis. Patient overall is a poor historian.  UTI: The UTI remains stable.  The patient denies ongoing suprapubic pain, flank pain, dysuria, urinary frequency, urinary hesitancy or hematuria.  No complications reported from the current antibiotic being used.  HTN: Pt 's HTN remains stable.  Denies CP, sob, DOE, pedal edema, headaches, dizziness or visual disturbances.  No complications from the medications currently being used.  Last BP : 126/74.  PAST MEDICAL HISTORY :  Past Medical History  Diagnosis Date  . Respiratory failure   . Dysphagia   . Contracture of hand joint   . Anemia   . Neurogenic bladder   . Anxiety   . Schizophrenia   . Quadriplegia   . Hypertension   . CHF (congestive heart failure)   . Anginal pain   . Right leg DVT 1980's  . Pneumonia     "today; I get it alot" (07/21/2012)  . History of blood transfusion     "I've had a few" (07/21/2012)  . GERD (gastroesophageal reflux disease)   . Arthritis     "in my hands" (07/21/2012)  . Depression   . Kidney stones     "about 4; 2 different times" (6/92014)  . On home oxygen therapy     "38% q hs" (07/21/2012)    PAST SURGICAL HISTORY: Past Surgical History  Procedure  Laterality Date  . Tracheostomy  2011  . Gastrostomy w/ feeding tube    . Tubal ligation  1996  . Cystoscopy/retrograde/ureteroscopy/stone extraction with basket    . Lithotripsy    . Peg placement      SOCIAL HISTORY:  reports that she quit smoking about 6 years ago. Her smoking use included Cigarettes. She has a 8 pack-year smoking history. She has never used smokeless tobacco. She reports that she does not drink alcohol or use illicit drugs.  FAMILY HISTORY: None  CURRENT MEDICATIONS: Reviewed per MAR/see medication list  REVIEW OF SYSTEMS: Unobtainable-patient is a poor historian  PHYSICAL EXAMINATION  VS:  See VS section  GENERAL: no acute distress, morbidly obese body habitus EYES: conjunctivae normal, sclerae normal, normal eye lids MOUTH/THROAT: lips without lesions,no lesions in the mouth,tongue is without lesions,uvula elevates in midline NECK: Trach present LYMPHATICS: no LAN in the neck, no supraclavicular LAN RESPIRATORY: breathing is even & unlabored, BS CTAB CARDIAC: RRR, no murmur,no extra heart sounds, no edema GI:  ABDOMEN: abdomen soft, no BS, no masses, no tenderness  LIVER/SPLEEN: no hepatomegaly, no splenomegaly MUSCULOSKELETAL: HEAD: normal to inspection  EXTREMITIES: LEFT UPPER EXTREMITY: Unable to assess- contracted RIGHT UPPER EXTREMITY: Unable to assess- contracted LEFT LOWER EXTREMITY:  Unable to assess-quadriplegic RIGHT LOWER EXTREMITY: Unable to assess-quadriplegic PSYCHIATRIC: the patient is alert & oriented to person, affect & behavior appropriate  LABS/RADIOLOGY:  Labs reviewed: Basic Metabolic Panel:  Recent Labs  16/10/96 0445  10/26/13 0615 10/27/13 0535 10/28/13 0500  NA 139  < > 134* 134* 136*  K 3.3*  < > 3.9 3.6* 3.1*  CL 96  < > 94* 91* 93*  CO2 34*  < > 31 35* 35*  GLUCOSE 82  < > 82 87 89  BUN 16  < > CREATININE 0.89  < > 0.74 0.78 0.72  CALCIUM 8.5  < > 8.5 8.9 8.7  MG 1.7  --   --   --  1.7  PHOS 4.2  --    --   --   --   < > = values in this interval not displayed. Liver Function Tests:  Recent Labs  08/23/13 1230 10/20/13 0445  AST 11 6  ALT 7 <5  ALKPHOS 71 66  BILITOT <0.2* <0.2*  PROT 6.9 6.1  ALBUMIN 3.0* 2.0*    Recent Labs  08/23/13 1230  LIPASE 11   CBC:  Recent Labs  08/23/13 1230 10/19/13 1622  10/20/13 0445 10/24/13 0500 10/27/13 0535  WBC 8.8 10.2  --  11.4* 12.3* 13.4*  NEUTROABS 5.9 6.4  --   --   --   --   HGB 9.2* 9.3*  < > 8.7* 8.9* 9.1*  HCT 31.9* 32.6*  < > 31.4* 31.1* 30.2*  MCV 77.2* 76.5*  --  78.1 75.9* 73.1*  PLT 193 234  --  250 295 312  < > = values in this interval not displayed.  Cardiac Enzymes:  Recent Labs  10/20/13 0445 10/20/13 1000 10/20/13 1705  TROPONINI <0.30 <0.30 <0.30    Transthoracic Echocardiography  Patient:    [name], [name] MR #:       [id number] Study Date: 10/23/2013 Gender:     F Age:        47 Height:     157.5 cm Weight:     88 kg BSA:        2 m^2 Pt. Status: Room:       [address]    [place] [id number]  ATTENDING    Sharl Ma, Gar Gibbon, Gagan S  REFERRING    Mauro Kaufmann S  SONOGRAPHER  Danford Bad, RCS  PERFORMING   [facility], Inpatient  cc:  -------------------------------------------------------------------  ------------------------------------------------------------------- Indications:      428.0 CHF.  ------------------------------------------------------------------- Impressions:  - Technically limited study.   The left ventricle is probably normal in size and appears to be   hyperdynamic (EF>70%) with a small intracavitary &quot;gradient&quot;.   The aortic valve and LV OT are not seen. The velocities across   the aortic valve are minimally elevated. This may be due to the   hyperdynamic state rather than true obstruction.   Other cardiac structures cannot be evaluated.  Transthoracic echocardiography.  M-mode, complete 2D, spectral Doppler, and  color Doppler.  Birthdate:  Patient birthdate: Jan 14, 1966.  Age:  Patient is 48 yr old.  Sex:  Gender: female. BMI: 35.5 kg/m^2.  Blood pressure:     166/83  Patient status: Inpatient.  Study date:  Study date: 10/23/2013. Study time: 03:35 PM.  -------------------------------------------------------------------  ------------------------------------------------------------------- Aortic valve:   Doppler:     Peak velocity ratio of LVOT to aortic valve: 0.61.    Peak gradient (S): 23 mm Hg.  ------------------------------------------------------------------- Mitral valve:   Doppler:     Peak gradient (D): 2 mm  Hg.  ------------------------------------------------------------------- Measurements   Left ventricle                    Value       Reference  LV e&', medial                     6.4   cm/s  ---------  LV E/e&', medial                   12.27       ---------    LVOT                              Value       Reference  LVOT peak velocity, S             145   cm/s  ---------  LVOT peak gradient, S             8     mm Hg ---------    Aortic valve                      Value       Reference  Aortic valve peak velocity, S     239   cm/s  ---------  Aortic peak gradient, S           23    mm Hg ---------  Velocity ratio, peak, LVOT/AV     0.61        ---------    Mitral valve                      Value       Reference  Mitral E-wave peak velocity       78.5  cm/s  ---------  Mitral A-wave peak velocity       128   cm/s  ---------  Mitral deceleration time      (L) 120   ms    150 - 230  Mitral peak gradient, D           2     mm Hg ---------  Mitral E/A ratio, peak            0.6         ---------  ABDOMEN - 2 VIEW   COMPARISON:  10/19/2013   FINDINGS: Left lateral decubitus images were obtained. No evidence for free air. Difficult to exclude right pleural fluid. Again noted is a spinal stimulator device. There is a large amount of stool throughout the abdomen.  Nonspecific bowel gas pattern. Degenerative changes in both hips, particularly on the right side.   IMPRESSION: Nonspecific bowel gas pattern. Large amount of stool in the abdomen NUCLEAR MEDICINE PERFUSION LUNG SCAN   Views: Anterior, posterior, left lateral, right lateral, RPO, LPO, RAO, LAO   Radionuclide:  Technetium 39m macroaggregated albumin   Dose:  5.5 mCi   Route of administration: Intravenous   COMPARISON:  Chest radiograph October 19, 2013   FINDINGS: There is slightly diminished uptake throughout the entire right lung compared to the left. This finding could be due to clearing effusion on the right. No segmental perfusion defects are identified on this study.   IMPRESSION: Question effusion on the right causing generalized decreased uptake on the right compared to the left. No wedge-shaped perfusion defects are identified. This study overall is felt  to constitute a low probability of pulmonary embolus.   PORTABLE CHEST - 1 VIEW   COMPARISON:  10/19/2013   FINDINGS: Increased moderate size right pleural effusion is seen. Bilateral lower lobe airspace disease is also seen which is increased since previous study. Cardiomegaly is stable.   Tracheostomy tube and left-sided Port-A-Cath remain in appropriate position.   IMPRESSION: Increased bilateral lower lobe airspace disease and moderate right pleural effusion.   Stable cardiomegaly.   ASSESSMENT/PLAN:  Pneumonia-continue Primaxin in as prescribed UTI-continue Primaxin and vancomycin as prescribed Hypertension-well-controlled Chronic respiratory failure-continue trach Schizophrenia-continue current medications Diastolic CHF-continue Lasix Constipation-continue current laxatives Check CBC and BMP  I have reviewed patient's medical records received at admission/from hospitalization.  CPT CODE: 16109  Angela Cox, MD [facility] [phone]

## 2013-11-06 ENCOUNTER — Non-Acute Institutional Stay: Payer: Medicare Other | Admitting: Internal Medicine

## 2013-11-06 ENCOUNTER — Encounter (SKILLED_NURSING_FACILITY): Payer: Medicare Other | Admitting: Internal Medicine

## 2013-11-06 DIAGNOSIS — J189 Pneumonia, unspecified organism: Secondary | ICD-10-CM

## 2013-11-06 DIAGNOSIS — Y95 Nosocomial condition: Principal | ICD-10-CM

## 2013-11-06 NOTE — Progress Notes (Signed)
Date: 11/06/2013  MRN:  [id number] Name:  [name] Sex:  female Age:  48 y.o. DOB:04-Mar-1965   [facility] #:                       Facility/Room; Level Of Care: SNF Provider:  [facility]  Emergency Contacts: Contact Information   Name Relation Home Work Mobile   [place] Sister [phone]      Code Status: MOST Form:  Allergies:   HPI: This is a patient who recently returned from the hospital having an extensive right greater than left lower lobe pneumonia and right pleural effusion. VQ scan was low probability for pulmonary embolism. She had a swallowing study performed that recommended a dysphagia 3 diet with thin liquids. She was started on vancomycin and Rocephin and later switched to cefepime. The patient's sputum grew out ESBL, both Proteus and Acinetobacter. She was discharged back you're on imipenem. She also seems to have stopped 2 of her psychiatric medications including Risperdal and Klonopin, this has been raised by the consultants pharmacist .  Past Medical History  Diagnosis Date  . Respiratory failure   . Dysphagia   . Contracture of hand joint   . Anemia   . Neurogenic bladder   . Anxiety   . Schizophrenia   . Quadriplegia   . Hypertension   . CHF (congestive heart failure)   . Anginal pain   . Right leg DVT 1980's  . Pneumonia     "today; I get it alot" (07/21/2012)  . History of blood transfusion     "I've had a few" (07/21/2012)  . GERD (gastroesophageal reflux disease)   . Arthritis     "in my hands" (07/21/2012)  . Depression   . Kidney stones     "about 4; 2 different times" (6/92014)  . On home oxygen therapy     "38% q hs" (07/21/2012)    Past Surgical History  Procedure Laterality Date  . Tracheostomy  2011  . Gastrostomy w/ feeding tube    . Tubal ligation  1996  . Cystoscopy/retrograde/ureteroscopy/stone extraction with basket    . Lithotripsy    . Peg placement        Procedures: Consultants:  Current Outpatient Prescriptions  Medication Sig Dispense Refill  . acetaminophen (TYLENOL) 500 MG tablet Take 1,000 mg by mouth 3 (three) times daily.      Marland Kitchen albuterol (PROVENTIL) (2.5 MG/3ML) 0.083% nebulizer solution Take 2.5 mg by nebulization every 6 (six) hours.      Marland Kitchen amLODipine (NORVASC) 5 MG tablet Take 5 mg by mouth daily.      Marland Kitchen aspirin 325 MG tablet Take 325 mg by mouth daily.      . bisacodyl (DULCOLAX) 10 MG suppository Place 10 mg rectally every other day as needed for constipation.      . calcium-vitamin D (OSCAL 500/200 D-3) 500-200 MG-UNIT per tablet Take 1 tablet by mouth every morning.      . docusate sodium (COLACE) 100 MG capsule Take 100 mg by mouth 2 (two) times daily.      . furosemide (LASIX) 20 MG tablet Take 1 tablet (20 mg total) by mouth 2 (two) times daily.  30 tablet    . guaiFENesin (ROBITUSSIN) 100 MG/5ML SOLN Take 15 mLs by mouth every 8 (eight) hours. scheduled      . HYDROcodone-acetaminophen (NORCO/VICODIN) 5-325 MG per tablet Take one tablet by mouth every 4 hours as needed for pain  180  tablet  0  . ipratropium (ATROVENT) 0.02 % nebulizer solution Take 500 mcg by nebulization every 6 (six) hours.      . magnesium hydroxide (MILK OF MAGNESIA) 400 MG/5ML suspension Take 30 mLs by mouth every other day as needed for mild constipation.      . metoprolol tartrate (LOPRESSOR) 25 MG tablet Take 50 mg by mouth 2 (two) times daily.       . Nutritional Supplements (ENSURE COMPLETE SHAKE) LIQD Take 1 Can by mouth 2 (two) times daily.  1 Bottle  1  . oxyCODONE (ROXICODONE) 15 MG immediate release tablet Take one tablet by mouth twice daily; Take one tablet by mouth every 4 hours as needed for pain  240 tablet  0  . pantoprazole sodium (PROTONIX) 40 mg/20 mL PACK Take 40 mg by mouth daily.      . polyethylene glycol (MIRALAX / GLYCOLAX) packet Take 17 g by mouth daily. constipation      . Polyvinyl Alcohol-Povidone (FRESHKOTE) 2.7-2 %  SOLN Place 1 drop into both eyes 4 (four) times daily.      . sennosides (SENOKOT) 8.8 MG/5ML syrup Take 10 mLs by mouth 2 (two) times daily.  240 mL  0  . sucralfate (CARAFATE) 1 G tablet Take 1 tablet (1 g total) by mouth 4 (four) times daily -  with meals and at bedtime.      Marland Kitchen venlafaxine (EFFEXOR) 100 MG tablet Take 100 mg by mouth 2 (two) times daily.      . ziprasidone (GEODON) 40 MG capsule Take 40 mg by mouth daily at 12 noon.      . ziprasidone (GEODON) 80 MG capsule Take 80 mg by mouth 2 (two) times daily with a meal.         Immunization History  Administered Date(s) Administered  . Influenza-Unspecified 11/12/2012  . Pneumococcal-Unspecified 02/24/2009     Diet:  History  Substance Use Topics  . Smoking status: Former Smoker -- 2.00 packs/day for 4 years    Types: Cigarettes    Quit date: 11/17/2006  . Smokeless tobacco: Never Used  . Alcohol Use: No    Vital signs: O2 sats 95% on trach collar oxygen at 28%, respirations 18 and unlabored pulse rate 77 Respiratory very shallow air entry bilaterally     CLINICAL DATA:  Dysphagia.  Chest discomfort.   EXAM: PORTABLE CHEST - 1 VIEW   COMPARISON:  10/19/2013   FINDINGS: Increased moderate size right pleural effusion is seen. Bilateral lower lobe airspace disease is also seen which is increased since previous study. Cardiomegaly is stable.   Tracheostomy tube and left-sided Port-A-Cath remain in appropriate position.   IMPRESSION: Increased bilateral lower lobe airspace disease and moderate right pleural effusion.   Stable cardiomegaly.    Cardiac heart sounds are normal there is no murmur she appears to be euvolemic Abdomen no liver spleen or tenderness PEG site looks fine. Extremities no evidence of a DVT   Impression/plan #1 extensive right greater than left pneumonia she has completed treatment. She still complains of shortness of breath. Nevertheless her oxygenation seems stable. #2 history of  severe psychosis with very prominent somatic delusions. In spite of this she seems stable on the Geodon and for now I am not going to restart the Risperdal or the Klonopin.

## 2013-11-06 NOTE — Progress Notes (Signed)
Patient ID: [name], female   DOB: [birth year], 48 y.o.   MRN: [id number]   MRN:  [id number] Name:  [name] Sex:  female Age:  48 y.o. DOB:March 24, 1965   [facility] #:                       Facility/Room; Level Of Care: SNF Provider:  [facility]  Emergency Contacts: Contact Information   Name Relation Home Work Mobile   [place] Sister [phone]      Code Status: MOST Form:  Allergies:   HPI: This is a patient who recently returned from the hospital having an extensive right greater than left lower lobe pneumonia and right pleural effusion. VQ scan was low probability for pulmonary embolism. She had a swallowing study performed that recommended a dysphagia 3 diet with thin liquids. She was started on vancomycin and Rocephin and later switched to cefepime. The patient's sputum grew out ESBL, both Proteus and Acinetobacter. She was discharged back you're on imipenem. She also seems to have stopped 2 of her psychiatric medications including Risperdal and Klonopin, this has been raised by the consultants pharmacist .  Past Medical History  Diagnosis Date  . Respiratory failure   . Dysphagia   . Contracture of hand joint   . Anemia   . Neurogenic bladder   . Anxiety   . Schizophrenia   . Quadriplegia   . Hypertension   . CHF (congestive heart failure)   . Anginal pain   . Right leg DVT 1980's  . Pneumonia     "today; I get it alot" (07/21/2012)  . History of blood transfusion     "I've had a few" (07/21/2012)  . GERD (gastroesophageal reflux disease)   . Arthritis     "in my hands" (07/21/2012)  . Depression   . Kidney stones     "about 4; 2 different times" (6/92014)  . On home oxygen therapy     "38% q hs" (07/21/2012)    Past Surgical History  Procedure Laterality Date  . Tracheostomy  2011  . Gastrostomy w/ feeding tube    . Tubal ligation  1996  . Cystoscopy/retrograde/ureteroscopy/stone extraction with basket    .  Lithotripsy    . Peg placement       Procedures: Consultants:  Current Outpatient Prescriptions  Medication Sig Dispense Refill  . acetaminophen (TYLENOL) 500 MG tablet Take 1,000 mg by mouth 3 (three) times daily.      Marland Kitchen albuterol (PROVENTIL) (2.5 MG/3ML) 0.083% nebulizer solution Take 2.5 mg by nebulization every 6 (six) hours.      Marland Kitchen amLODipine (NORVASC) 5 MG tablet Take 5 mg by mouth daily.      Marland Kitchen aspirin 325 MG tablet Take 325 mg by mouth daily.      . bisacodyl (DULCOLAX) 10 MG suppository Place 10 mg rectally every other day as needed for constipation.      . calcium-vitamin D (OSCAL 500/200 D-3) 500-200 MG-UNIT per tablet Take 1 tablet by mouth every morning.      . docusate sodium (COLACE) 100 MG capsule Take 100 mg by mouth 2 (two) times daily.      . furosemide (LASIX) 20 MG tablet Take 1 tablet (20 mg total) by mouth 2 (two) times daily.  30 tablet    . guaiFENesin (ROBITUSSIN) 100 MG/5ML SOLN Take 15 mLs by mouth every 8 (eight) hours. scheduled      . HYDROcodone-acetaminophen (NORCO/VICODIN) 5-325 MG per tablet  Take one tablet by mouth every 4 hours as needed for pain  180 tablet  0  . ipratropium (ATROVENT) 0.02 % nebulizer solution Take 500 mcg by nebulization every 6 (six) hours.      . magnesium hydroxide (MILK OF MAGNESIA) 400 MG/5ML suspension Take 30 mLs by mouth every other day as needed for mild constipation.      . metoprolol tartrate (LOPRESSOR) 25 MG tablet Take 50 mg by mouth 2 (two) times daily.       . Nutritional Supplements (ENSURE COMPLETE SHAKE) LIQD Take 1 Can by mouth 2 (two) times daily.  1 Bottle  1  . oxyCODONE (ROXICODONE) 15 MG immediate release tablet Take one tablet by mouth twice daily; Take one tablet by mouth every 4 hours as needed for pain  240 tablet  0  . pantoprazole sodium (PROTONIX) 40 mg/20 mL PACK Take 40 mg by mouth daily.      . polyethylene glycol (MIRALAX / GLYCOLAX) packet Take 17 g by mouth daily. constipation      . Polyvinyl  Alcohol-Povidone (FRESHKOTE) 2.7-2 % SOLN Place 1 drop into both eyes 4 (four) times daily.      . sennosides (SENOKOT) 8.8 MG/5ML syrup Take 10 mLs by mouth 2 (two) times daily.  240 mL  0  . sucralfate (CARAFATE) 1 G tablet Take 1 tablet (1 g total) by mouth 4 (four) times daily -  with meals and at bedtime.      Marland Kitchen venlafaxine (EFFEXOR) 100 MG tablet Take 100 mg by mouth 2 (two) times daily.      . ziprasidone (GEODON) 40 MG capsule Take 40 mg by mouth daily at 12 noon.      . ziprasidone (GEODON) 80 MG capsule Take 80 mg by mouth 2 (two) times daily with a meal.         Immunization History  Administered Date(s) Administered  . Influenza-Unspecified 11/12/2012  . Pneumococcal-Unspecified 02/24/2009     Diet:  History  Substance Use Topics  . Smoking status: Former Smoker -- 2.00 packs/day for 4 years    Types: Cigarettes    Quit date: 11/17/2006  . Smokeless tobacco: Never Used  . Alcohol Use: No    Vital signs: O2 sats 95% on trach collar oxygen at 28%, respirations 18 and unlabored pulse rate 77 Respiratory very shallow air entry bilaterally     CLINICAL DATA:  Dysphagia.  Chest discomfort.   EXAM: PORTABLE CHEST - 1 VIEW   COMPARISON:  10/19/2013   FINDINGS: Increased moderate size right pleural effusion is seen. Bilateral lower lobe airspace disease is also seen which is increased since previous study. Cardiomegaly is stable.   Tracheostomy tube and left-sided Port-A-Cath remain in appropriate position.   IMPRESSION: Increased bilateral lower lobe airspace disease and moderate right pleural effusion.   Stable cardiomegaly.    Cardiac heart sounds are normal there is no murmur she appears to be euvolemic Abdomen no liver spleen or tenderness PEG site looks fine. Extremities no evidence of a DVT   Impression/plan #1 extensive right greater than left pneumonia she has completed treatment. She still complains of shortness of breath. Nevertheless her  oxygenation seems stable. #2 history of severe psychosis with very prominent somatic delusions. In spite of this she seems stable on the Geodon and for now I am not going to restart the Risperdal or the Klonopin.

## 2013-11-12 ENCOUNTER — Non-Acute Institutional Stay (SKILLED_NURSING_FACILITY): Payer: Medicare Other | Admitting: Internal Medicine

## 2013-11-12 DIAGNOSIS — L89220 Pressure ulcer of left hip, unstageable: Secondary | ICD-10-CM

## 2013-11-16 NOTE — Progress Notes (Addendum)
Patient ID: Kerry Fortdwina Halbur, female   DOB: 1965-12-04, 48 y.o.   MRN: [id number]               PROGRESS NOTE  DATE:  11/12/2013    [facility]    LEVEL OF CARE:   SNF   Acute Visit   CHIEF COMPLAINT:  Review of pressure ulcer on the posterior aspect of her left greater trochanter.    HISTORY OF PRESENT ILLNESS:  Mrs. Laurence AlyCorbett is a patient who recently returned to the facility after being hospitalized for an extensive right greater than left pneumonia and a right pleural effusion.  She has a history of pressure ulcers in the area.  However, after she came back from the hospital, this had reopened and I was asked to look at this today.    PHYSICAL EXAMINATION:   SKIN:  INSPECTION:  Left greater trochanter:  There is a fairly substantial unstageable wound here with thick adherent eschar.  Using forceps and a scalpel, I removed a large amount of this.  Unfortunately, there was a nick and arterial bleeding.  This ultimately responded to silver nitrate and pressure.  There is no evidence of infection around the site.  The dressing will be changed to Santyl and foam.    ASSESSMENT/PLAN:  Unstageable wound on the left greater trochanter in the midst of scar tissue, but without significant infection.  This underwent an extensive debridement which was surgical.  I removed a copious amount of necrotic tissue.  The bleeding required silver nitrate and direct pressure.  This will require a Santyl based dressing and likely further mechanical debridement and/or pulse lavage.

## 2013-12-07 ENCOUNTER — Non-Acute Institutional Stay (SKILLED_NURSING_FACILITY): Payer: Medicare Other | Admitting: Internal Medicine

## 2013-12-07 DIAGNOSIS — D638 Anemia in other chronic diseases classified elsewhere: Secondary | ICD-10-CM

## 2013-12-07 DIAGNOSIS — J9610 Chronic respiratory failure, unspecified whether with hypoxia or hypercapnia: Secondary | ICD-10-CM

## 2013-12-07 DIAGNOSIS — G8250 Quadriplegia, unspecified: Secondary | ICD-10-CM

## 2013-12-07 DIAGNOSIS — I10 Essential (primary) hypertension: Secondary | ICD-10-CM

## 2013-12-08 NOTE — Progress Notes (Signed)
Patient ID: [name], female   DOB: 24-Sep-1965, 48 y.o.   MRN: [id number]         PROGRESS NOTE  DATE:  12/07/2013  [facility]   LEVEL OF CARE: SNF  Routine Visit  CHIEF COMPLAINT:  Manage anemia of chronic dz, insomnia and hypertension.   HISTORY OF PRESENT ILLNESS:    REASSESSMENT OF ONGOING PROBLEM(S):  ANEMIA:  On 05/16/2012:  Patient's hemoglobin was 10.1, MCV 73 on 1/14.  On 04/28/2012:  Hemoglobin was 10.4, and 6/14 hemoglobin 9.3, in 10/14 Hb 10.9, mcv 74, in 4-15 hemoglobin 9.5, MCV 75, in 6-15 hemoglobin 10, MCV 72, in 7-15 hemoglobin 11.4, in 9-15 Hb 9.1, mcv 75.    The patient denies fatigue, melena or hematochezia. No complications from the medications currently being used.   HTN: Pt 's HTN remains stable.  Denies CP, sob, DOE, headaches, dizziness or visual disturbances.  No complications from the medications currently being used.  Last BP : 150/80,  120/62, 114/62, 140/80, 158/90, 122/64, 140/65, 150/82, 139/86, 109/62, 104/66, 158/72, 110/60, 124/68, 108/63.  INSOMNIA: The insomnia remains stable.  No complications noted from the medications presently being used. Patient denies ongoing insomnia, pain, hallucinations, delusions.  PAST MEDICAL HISTORY : Reviewed.  No changes.  [facility]: [facility]  REVIEW OF SYSTEMS:  Difficult to obtain.  Patient is a poor historian  PHYSICAL EXAMINATION  VS: See vital signs section  GENERAL: no acute distress, morbidly obese body habitus EYES: conjunctivae normal, sclerae normal, normal eye lids NECK: patient has a trach LYMPHATICS: difficult to assess due to trach RESPIRATORY: breathing is even & unlabored, BS CTAB CARDIAC: RRR, no murmur,no extra heart sounds EDEMA/VARICOSITIES:  +1 bilateral lower extremity edema  ARTERIAL:  pedal pulses nonpalpable  GI: abdomen soft, normal BS, no masses, no tenderness, no hepatomegaly, no splenomegaly PSYCHIATRIC: the patient is alert & oriented to  person, affect & behavior appropriate  LABS/RADIOLOGY: 9-15 Hb 9.1, mcv 75 ow cbc nl, bmp nl, HbA1c 5.3 7-15 CBC normal, triglycerides 159 otherwise FLP normal 6-15 hemoglobin 10, MCV 72 otherwise CBC normal, creatinine 1.1 otherwise BMP normal, hemoglobin A1c 6 4-15 hemoglobin 9.5, MCV 75 otherwise CBC normal, albumin 3.4 otherwise CMP normal 3-15 hemoglobin A1c 5.9 1-15 hemoglobin 10.2, MCV 75 otherwise CBC normal 12 -14 hemoglobin A1c 5.8  10/14 Hb 10.9, mcv 74 ow cbc nl, glc 119 ow cmp nl  9-14 hemoglobin A1c 6.1 7-14 BUN 19, creatinine 1.02 6/14 BUN 30, creatinine 1.25, hemoglobin 9.3, MCV 75 otherwise CBC normal, hemoglobin A1c 6 In 05/2012:  Hemoglobin 10.1, MCV 73, otherwise CBC normal.    CMP normal.  In 04/2012:  Hemoglobin A1C  5.7.    ASSESSMENT/PLAN:    Anemia of chronic dz. Stable.  Hypertension. Controlled.   Chronic respiratory failure.  Continue trach support.   Quadriplegia.  Continue supportive care.   Schizophrenia.  Stable.   GERD.  Continue Protonix.   Constipation.  Continue current laxatives.    Chronic pain.  Currently on routine Ultram.    CPT CODE: [id number]  Newton PiggGayani Y. Sarah Doryasanayaka, MD [facility] [phone]

## 2014-01-15 ENCOUNTER — Encounter (HOSPITAL_COMMUNITY): Payer: Self-pay

## 2014-01-15 ENCOUNTER — Inpatient Hospital Stay (HOSPITAL_COMMUNITY)
Admission: EM | Admit: 2014-01-15 | Discharge: 2014-02-12 | DRG: 296 | Disposition: E | Payer: Medicare Other | Attending: Emergency Medicine | Admitting: Emergency Medicine

## 2014-01-15 ENCOUNTER — Inpatient Hospital Stay (HOSPITAL_COMMUNITY): Payer: Medicare Other

## 2014-01-15 ENCOUNTER — Other Ambulatory Visit: Payer: Self-pay

## 2014-01-15 ENCOUNTER — Emergency Department (HOSPITAL_COMMUNITY): Payer: Medicare Other

## 2014-01-15 DIAGNOSIS — D638 Anemia in other chronic diseases classified elsewhere: Secondary | ICD-10-CM | POA: Diagnosis present

## 2014-01-15 DIAGNOSIS — Z79899 Other long term (current) drug therapy: Secondary | ICD-10-CM

## 2014-01-15 DIAGNOSIS — I959 Hypotension, unspecified: Secondary | ICD-10-CM | POA: Diagnosis present

## 2014-01-15 DIAGNOSIS — E46 Unspecified protein-calorie malnutrition: Secondary | ICD-10-CM | POA: Diagnosis present

## 2014-01-15 DIAGNOSIS — G9382 Brain death: Secondary | ICD-10-CM | POA: Diagnosis present

## 2014-01-15 DIAGNOSIS — Z9981 Dependence on supplemental oxygen: Secondary | ICD-10-CM

## 2014-01-15 DIAGNOSIS — R570 Cardiogenic shock: Secondary | ICD-10-CM | POA: Diagnosis present

## 2014-01-15 DIAGNOSIS — R1310 Dysphagia, unspecified: Secondary | ICD-10-CM | POA: Diagnosis present

## 2014-01-15 DIAGNOSIS — F419 Anxiety disorder, unspecified: Secondary | ICD-10-CM | POA: Diagnosis present

## 2014-01-15 DIAGNOSIS — J9621 Acute and chronic respiratory failure with hypoxia: Secondary | ICD-10-CM | POA: Diagnosis not present

## 2014-01-15 DIAGNOSIS — I10 Essential (primary) hypertension: Secondary | ICD-10-CM | POA: Diagnosis present

## 2014-01-15 DIAGNOSIS — I509 Heart failure, unspecified: Secondary | ICD-10-CM | POA: Diagnosis present

## 2014-01-15 DIAGNOSIS — Z66 Do not resuscitate: Secondary | ICD-10-CM | POA: Diagnosis present

## 2014-01-15 DIAGNOSIS — I469 Cardiac arrest, cause unspecified: Principal | ICD-10-CM | POA: Diagnosis present

## 2014-01-15 DIAGNOSIS — F329 Major depressive disorder, single episode, unspecified: Secondary | ICD-10-CM | POA: Diagnosis present

## 2014-01-15 DIAGNOSIS — N179 Acute kidney failure, unspecified: Secondary | ICD-10-CM | POA: Diagnosis present

## 2014-01-15 DIAGNOSIS — J90 Pleural effusion, not elsewhere classified: Secondary | ICD-10-CM | POA: Diagnosis present

## 2014-01-15 DIAGNOSIS — F209 Schizophrenia, unspecified: Secondary | ICD-10-CM | POA: Diagnosis present

## 2014-01-15 DIAGNOSIS — I4891 Unspecified atrial fibrillation: Secondary | ICD-10-CM | POA: Diagnosis present

## 2014-01-15 DIAGNOSIS — Z6835 Body mass index (BMI) 35.0-35.9, adult: Secondary | ICD-10-CM | POA: Diagnosis not present

## 2014-01-15 DIAGNOSIS — Z87891 Personal history of nicotine dependence: Secondary | ICD-10-CM | POA: Diagnosis not present

## 2014-01-15 DIAGNOSIS — Z930 Tracheostomy status: Secondary | ICD-10-CM | POA: Diagnosis not present

## 2014-01-15 DIAGNOSIS — E872 Acidosis: Secondary | ICD-10-CM | POA: Diagnosis present

## 2014-01-15 DIAGNOSIS — N319 Neuromuscular dysfunction of bladder, unspecified: Secondary | ICD-10-CM | POA: Diagnosis present

## 2014-01-15 DIAGNOSIS — E875 Hyperkalemia: Secondary | ICD-10-CM | POA: Diagnosis present

## 2014-01-15 DIAGNOSIS — Z9289 Personal history of other medical treatment: Secondary | ICD-10-CM

## 2014-01-15 DIAGNOSIS — J398 Other specified diseases of upper respiratory tract: Secondary | ICD-10-CM

## 2014-01-15 DIAGNOSIS — Z931 Gastrostomy status: Secondary | ICD-10-CM | POA: Diagnosis not present

## 2014-01-15 DIAGNOSIS — G8250 Quadriplegia, unspecified: Secondary | ICD-10-CM | POA: Diagnosis present

## 2014-01-15 DIAGNOSIS — E871 Hypo-osmolality and hyponatremia: Secondary | ICD-10-CM | POA: Diagnosis present

## 2014-01-15 DIAGNOSIS — G931 Anoxic brain damage, not elsewhere classified: Secondary | ICD-10-CM | POA: Diagnosis not present

## 2014-01-15 DIAGNOSIS — Z7982 Long term (current) use of aspirin: Secondary | ICD-10-CM | POA: Diagnosis not present

## 2014-01-15 DIAGNOSIS — K219 Gastro-esophageal reflux disease without esophagitis: Secondary | ICD-10-CM | POA: Diagnosis present

## 2014-01-15 LAB — BLOOD GAS, ARTERIAL
ACID-BASE EXCESS: 4.4 mmol/L — AB (ref 0.0–2.0)
ACID-BASE EXCESS: 3.1 mmol/L — AB (ref 0.0–2.0)
Bicarbonate: 28.5 mEq/L — ABNORMAL HIGH (ref 20.0–24.0)
Bicarbonate: 29.1 mEq/L — ABNORMAL HIGH (ref 20.0–24.0)
DRAWN BY: 275531
Drawn by: 275531
FIO2: 0.4 %
MECHVT: 500 mL
O2 CONTENT: 8 L/min
O2 Saturation: 99.2 %
O2 Saturation: 99.8 %
PCO2 ART: 43.4 mmHg (ref 35.0–45.0)
PCO2 ART: 62.4 mmHg — AB (ref 35.0–45.0)
PEEP: 5 cmH2O
PH ART: 7.433 (ref 7.350–7.450)
PH ART: 7.291 — AB (ref 7.350–7.450)
PO2 ART: 155 mmHg — AB (ref 80.0–100.0)
Patient temperature: 98.6
Patient temperature: 98.6
RATE: 10 resp/min
TCO2: 29.9 mmol/L (ref 0–100)
TCO2: 31 mmol/L (ref 0–100)
pO2, Arterial: 213 mmHg — ABNORMAL HIGH (ref 80.0–100.0)

## 2014-01-15 LAB — URINALYSIS, ROUTINE W REFLEX MICROSCOPIC
Bilirubin Urine: NEGATIVE
Glucose, UA: 100 mg/dL — AB
Ketones, ur: NEGATIVE mg/dL
NITRITE: NEGATIVE
Protein, ur: 100 mg/dL — AB
Specific Gravity, Urine: 1.01 (ref 1.005–1.030)
UROBILINOGEN UA: 0.2 mg/dL (ref 0.0–1.0)
pH: 7 (ref 5.0–8.0)

## 2014-01-15 LAB — BASIC METABOLIC PANEL
ANION GAP: 15 (ref 5–15)
BUN: 25 mg/dL — AB (ref 6–23)
CALCIUM: 9.6 mg/dL (ref 8.4–10.5)
CO2: 26 meq/L (ref 19–32)
Chloride: 97 mEq/L (ref 96–112)
Creatinine, Ser: 1.28 mg/dL — ABNORMAL HIGH (ref 0.50–1.10)
GFR calc Af Amer: 56 mL/min — ABNORMAL LOW (ref >90)
GFR calc non Af Amer: 48 mL/min — ABNORMAL LOW (ref >90)
Glucose, Bld: 154 mg/dL — ABNORMAL HIGH (ref 70–99)
Potassium: 3.1 mEq/L — ABNORMAL LOW (ref 3.7–5.3)
Sodium: 138 mEq/L (ref 137–147)

## 2014-01-15 LAB — CBC WITH DIFFERENTIAL/PLATELET
BASOS ABS: 0 10*3/uL (ref 0.0–0.1)
BASOS PCT: 0 % (ref 0–1)
EOS ABS: 0.4 10*3/uL (ref 0.0–0.7)
Eosinophils Relative: 1 % (ref 0–5)
HEMATOCRIT: 27.2 % — AB (ref 36.0–46.0)
HEMOGLOBIN: 7.5 g/dL — AB (ref 12.0–15.0)
LYMPHS PCT: 20 % (ref 12–46)
Lymphs Abs: 8.9 10*3/uL — ABNORMAL HIGH (ref 0.7–4.0)
MCH: 21.6 pg — ABNORMAL LOW (ref 26.0–34.0)
MCHC: 27.6 g/dL — ABNORMAL LOW (ref 30.0–36.0)
MCV: 78.2 fL (ref 78.0–100.0)
MONOS PCT: 4 % (ref 3–12)
Monocytes Absolute: 1.8 10*3/uL — ABNORMAL HIGH (ref 0.1–1.0)
NEUTROS PCT: 75 % (ref 43–77)
Neutro Abs: 33.5 10*3/uL — ABNORMAL HIGH (ref 1.7–7.7)
Platelets: 333 10*3/uL (ref 150–400)
RBC: 3.48 MIL/uL — ABNORMAL LOW (ref 3.87–5.11)
RDW: 14.8 % (ref 11.5–15.5)
WBC: 44.6 10*3/uL — AB (ref 4.0–10.5)

## 2014-01-15 LAB — CBG MONITORING, ED: Glucose-Capillary: 287 mg/dL — ABNORMAL HIGH (ref 70–99)

## 2014-01-15 LAB — TROPONIN I
Troponin I: <0.3 ng/mL (ref <0.30)
Troponin I: 0.41 ng/mL (ref <0.30)

## 2014-01-15 LAB — POCT I-STAT, CHEM 8
BUN: 25 mg/dL — ABNORMAL HIGH (ref 6–23)
CALCIUM ION: 1.09 mmol/L — AB (ref 1.12–1.23)
Chloride: 96 mEq/L (ref 96–112)
Creatinine, Ser: 1.8 mg/dL — ABNORMAL HIGH (ref 0.50–1.10)
GLUCOSE: 363 mg/dL — AB (ref 70–99)
HEMATOCRIT: 27 % — AB (ref 36.0–46.0)
HEMOGLOBIN: 9.2 g/dL — AB (ref 12.0–15.0)
Potassium: 5.7 mEq/L — ABNORMAL HIGH (ref 3.7–5.3)
Sodium: 129 mEq/L — ABNORMAL LOW (ref 137–147)
TCO2: 17 mmol/L (ref 0–100)

## 2014-01-15 LAB — COMPREHENSIVE METABOLIC PANEL
ALT: 125 U/L — ABNORMAL HIGH (ref 0–35)
AST: 151 U/L — AB (ref 0–37)
Albumin: 2.2 g/dL — ABNORMAL LOW (ref 3.5–5.2)
Alkaline Phosphatase: 178 U/L — ABNORMAL HIGH (ref 39–117)
Anion gap: 29 — ABNORMAL HIGH (ref 5–15)
BUN: 22 mg/dL (ref 6–23)
CALCIUM: 8.8 mg/dL (ref 8.4–10.5)
CO2: 14 meq/L — AB (ref 19–32)
Chloride: 86 mEq/L — ABNORMAL LOW (ref 96–112)
Creatinine, Ser: 1.4 mg/dL — ABNORMAL HIGH (ref 0.50–1.10)
GFR calc Af Amer: 50 mL/min — ABNORMAL LOW (ref >90)
GFR calc non Af Amer: 43 mL/min — ABNORMAL LOW (ref >90)
Glucose, Bld: 354 mg/dL — ABNORMAL HIGH (ref 70–99)
POTASSIUM: 6 meq/L — AB (ref 3.7–5.3)
SODIUM: 129 meq/L — AB (ref 137–147)
Total Bilirubin: <0.2 mg/dL — ABNORMAL LOW (ref 0.3–1.2)
Total Protein: 6.9 g/dL (ref 6.0–8.3)

## 2014-01-15 LAB — PROCALCITONIN: Procalcitonin: 16.55 ng/mL

## 2014-01-15 LAB — CG4 I-STAT (LACTIC ACID): Lactic Acid, Venous: 13.56 mmol/L — ABNORMAL HIGH (ref 0.5–2.2)

## 2014-01-15 LAB — POCT I-STAT 3, ART BLOOD GAS (G3+)
Acid-base deficit: 4 mmol/L — ABNORMAL HIGH (ref 0.0–2.0)
Bicarbonate: 21.8 mEq/L (ref 20.0–24.0)
O2 Saturation: 100 %
Patient temperature: 98.6
TCO2: 23 mmol/L (ref 0–100)
pCO2 arterial: 44.1 mmHg (ref 35.0–45.0)
pH, Arterial: 7.301 — ABNORMAL LOW (ref 7.350–7.450)
pO2, Arterial: 247 mmHg — ABNORMAL HIGH (ref 80.0–100.0)

## 2014-01-15 LAB — URINE MICROSCOPIC-ADD ON

## 2014-01-15 LAB — CREATININE, SERUM
CREATININE: 1.35 mg/dL — AB (ref 0.50–1.10)
GFR calc Af Amer: 52 mL/min — ABNORMAL LOW (ref >90)
GFR calc non Af Amer: 45 mL/min — ABNORMAL LOW (ref >90)

## 2014-01-15 LAB — LACTIC ACID, PLASMA
Lactic Acid, Venous: 5.2 mmol/L — ABNORMAL HIGH (ref 0.5–2.2)
Lactic Acid, Venous: 1.5 mmol/L (ref 0.5–2.2)

## 2014-01-15 LAB — MRSA PCR SCREENING: MRSA by PCR: POSITIVE — AB

## 2014-01-15 LAB — PROTIME-INR
INR: 1.41 (ref 0.00–1.49)
Prothrombin Time: 17.4 seconds — ABNORMAL HIGH (ref 11.6–15.2)

## 2014-01-15 MED ORDER — AMIODARONE HCL IN DEXTROSE 360-4.14 MG/200ML-% IV SOLN: 60.0000 mg/h | Freq: Once | INTRAVENOUS | Status: DC

## 2014-01-15 MED ORDER — DEXTROSE 5 % IV SOLN
150.0000 mg | INTRAVENOUS | Status: AC | PRN
  Administered 2014-01-15 (×2): 150 mg via INTRAVENOUS

## 2014-01-15 MED ORDER — CHLORHEXIDINE GLUCONATE 0.12 % MT SOLN
15.0000 mL | Freq: Two times a day (BID) | OROMUCOSAL | Status: DC
  Administered 2014-01-15 – 2014-01-16 (×3): 15 mL via OROMUCOSAL

## 2014-01-15 MED ORDER — ASPIRIN 81 MG PO CHEW: 324.0000 mg | CHEWABLE_TABLET | ORAL | Status: DC

## 2014-01-15 MED ORDER — TECHNETIUM TC 99M EXAMETAZIME IV KIT
10.0000 | PACK | Freq: Once | INTRAVENOUS | Status: AC | PRN
  Administered 2014-01-15: 20 via INTRAVENOUS

## 2014-01-15 MED ORDER — MUPIROCIN 2 % EX OINT
1.0000 "application " | TOPICAL_OINTMENT | Freq: Two times a day (BID) | CUTANEOUS | Status: DC
  Administered 2014-01-15 – 2014-01-16 (×3): 1 via NASAL
  Filled 2014-01-15: qty 22

## 2014-01-15 MED ORDER — CHLORHEXIDINE GLUCONATE CLOTH 2 % EX PADS
6.0000 | MEDICATED_PAD | Freq: Every day | CUTANEOUS | Status: DC
  Administered 2014-01-15 – 2014-01-16 (×2): 6 via TOPICAL

## 2014-01-15 MED ORDER — PANTOPRAZOLE SODIUM 40 MG IV SOLR
40.0000 mg | Freq: Every day | INTRAVENOUS | Status: DC
  Administered 2014-01-15: 40 mg via INTRAVENOUS
  Filled 2014-01-15: qty 40

## 2014-01-15 MED ORDER — AMIODARONE HCL IN DEXTROSE 360-4.14 MG/200ML-% IV SOLN
30.0000 mg/h | INTRAVENOUS | Status: DC
  Administered 2014-01-15 (×2): 30 mg/h via INTRAVENOUS
  Filled 2014-01-15 (×2): qty 200

## 2014-01-15 MED ORDER — AMIODARONE HCL IN DEXTROSE 360-4.14 MG/200ML-% IV SOLN: 60.0000 mg/h | INTRAVENOUS | Status: DC

## 2014-01-15 MED ORDER — HEPARIN SODIUM (PORCINE) 5000 UNIT/ML IJ SOLN: 5000.0000 [IU] | Freq: Three times a day (TID) | INTRAMUSCULAR | Status: DC

## 2014-01-15 MED ORDER — CETYLPYRIDINIUM CHLORIDE 0.05 % MT LIQD
7.0000 mL | Freq: Four times a day (QID) | OROMUCOSAL | Status: DC
  Administered 2014-01-15 – 2014-01-16 (×5): 7 mL via OROMUCOSAL

## 2014-01-15 MED ORDER — HEPARIN BOLUS VIA INFUSION
4000.0000 [IU] | Freq: Once | INTRAVENOUS | Status: AC
  Administered 2014-01-15: 4000 [IU] via INTRAVENOUS
  Filled 2014-01-15: qty 4000

## 2014-01-15 MED ORDER — SODIUM CHLORIDE 0.9 % IV SOLN
INTRAVENOUS | Status: DC
  Administered 2014-01-15 – 2014-01-16 (×2): via INTRAVENOUS

## 2014-01-15 MED ORDER — INSULIN ASPART 100 UNIT/ML ~~LOC~~ SOLN
10.0000 [IU] | Freq: Once | SUBCUTANEOUS | Status: AC
  Administered 2014-01-15: 10 [IU] via INTRAVENOUS

## 2014-01-15 MED ORDER — SODIUM BICARBONATE 8.4 % IV SOLN
INTRAVENOUS | Status: AC | PRN
  Administered 2014-01-15: 50 meq via INTRAVENOUS

## 2014-01-15 MED ORDER — CALCIUM CHLORIDE 10 % IV SOLN
INTRAVENOUS | Status: AC | PRN
  Administered 2014-01-15: 50 meq via INTRAVENOUS

## 2014-01-15 MED ORDER — VANCOMYCIN HCL IN DEXTROSE 1-5 GM/200ML-% IV SOLN
1000.0000 mg | Freq: Two times a day (BID) | INTRAVENOUS | Status: DC
  Administered 2014-01-15 – 2014-01-16 (×3): 1000 mg via INTRAVENOUS
  Filled 2014-01-15 (×3): qty 200

## 2014-01-15 MED ORDER — EPINEPHRINE HCL 1 MG/ML IJ SOLN
0.5000 ug/min | INTRAMUSCULAR | Status: DC
  Filled 2014-01-15: qty 4

## 2014-01-15 MED ORDER — HEPARIN (PORCINE) IN NACL 100-0.45 UNIT/ML-% IJ SOLN
1100.0000 [IU]/h | INTRAMUSCULAR | Status: DC
  Administered 2014-01-15 – 2014-01-16 (×2): 1100 [IU]/h via INTRAVENOUS
  Filled 2014-01-15 (×2): qty 250

## 2014-01-15 MED ORDER — PHENYLEPHRINE HCL 10 MG/ML IJ SOLN
0.0000 ug/min | INTRAVENOUS | Status: DC
  Filled 2014-01-15: qty 1

## 2014-01-15 MED ORDER — SODIUM BICARBONATE 8.4 % IV SOLN: 50.0000 meq | Freq: Once | INTRAVENOUS | Status: AC

## 2014-01-15 MED ORDER — SODIUM CHLORIDE 0.9 % IV SOLN
INTRAVENOUS | Status: AC | PRN
  Administered 2014-01-15: 150 mL/h via INTRAVENOUS

## 2014-01-15 MED ORDER — DEXTROSE 5 % IV SOLN
0.5000 ug/min | INTRAVENOUS | Status: DC
  Administered 2014-01-15: 0.5 ug/min via INTRAVENOUS
  Filled 2014-01-15: qty 4

## 2014-01-15 MED ORDER — ASPIRIN 300 MG RE SUPP: 300.0000 mg | RECTAL | Status: DC

## 2014-01-15 MED ORDER — SODIUM CHLORIDE 0.9 % IV SOLN: 250.0000 mL | INTRAVENOUS | Status: DC | PRN

## 2014-01-15 MED ORDER — PIPERACILLIN-TAZOBACTAM 3.375 G IVPB
3.3750 g | Freq: Three times a day (TID) | INTRAVENOUS | Status: DC
  Administered 2014-01-15 – 2014-01-16 (×3): 3.375 g via INTRAVENOUS
  Filled 2014-01-15 (×5): qty 50

## 2014-01-15 MED ORDER — SODIUM CHLORIDE 0.9 % IV SOLN
1.0000 g | Freq: Once | INTRAVENOUS | Status: DC
  Filled 2014-01-15: qty 10

## 2014-01-15 NOTE — Consult Note (Signed)
Consult Reason for Consult:hypioxic event, question brainstem function Referring Physician: Dr Molli Knock PCCM  CC: anoxic brain injury  HPI: [name] is an 48 y.o. female quadriplegic with chronic trach, resident of [facility] [place], found apneic and unresponsive with unknown down time. EMS dispatched, pt was asystole on arrival. CPR performed for 6 - 8 minutes and 2 rounds epi administered before ROSC. Then developed A.fib with RVR. On ED arrival, remained hypotensive. Started on epi drip in ED.   CT head from 12/04 images reviewed and found to be consistent with diffuse anoxic brain injury.   Past Medical History  Diagnosis Date  . Respiratory failure   . Dysphagia   . Contracture of hand joint   . Anemia   . Neurogenic bladder   . Anxiety   . Schizophrenia   . Quadriplegia   . Hypertension   . CHF (congestive heart failure)   . Anginal pain   . Right leg DVT 1980's  . Pneumonia     "today; I get it alot" (07/21/2012)  . History of blood transfusion     "I've had a few" (07/21/2012)  . GERD (gastroesophageal reflux disease)   . Arthritis     "in my hands" (07/21/2012)  . Depression   . Kidney stones     "about 4; 2 different times" (6/92014)  . On home oxygen therapy     "38% q hs" (07/21/2012)    Past Surgical History  Procedure Laterality Date  . Tracheostomy  2011  . Gastrostomy w/ feeding tube    . Tubal ligation  1996  . Cystoscopy/retrograde/ureteroscopy/stone extraction with basket    . Lithotripsy    . Peg placement      History reviewed. No pertinent family history.  Social History:  reports that she quit smoking about 7 years ago. Her smoking use included Cigarettes. She has a 8 pack-year smoking history. She has never used smokeless tobacco. She reports that she does not drink alcohol or use illicit drugs.  Allergies  Allergen Reactions  . Latex Other (See Comments)    Reaction unknown    Medications:  Scheduled: . antiseptic oral rinse  7 mL  Mouth Rinse QID  . aspirin  324 mg Oral NOW   Or  . aspirin  300 mg Rectal NOW  . calcium gluconate 1 GM IV  1 g Intravenous Once  . chlorhexidine  15 mL Mouth Rinse BID  . pantoprazole (PROTONIX) IV  40 mg Intravenous QHS  . piperacillin-tazobactam (ZOSYN)  IV  3.375 g Intravenous 3 times per day  . vancomycin  1,000 mg Intravenous Q12H     ROS: Out of a complete 14 system review, the patient complains of only the following symptoms, and all other reviewed systems are negative. Unable to obtain due to mental status Physical Examination: Filed Vitals:   2014/02/03 0815  BP: 81/67  Pulse: 99  Temp:   Resp: 15   Neuro Exam Patient does not respond to verbal stimuli.  Does not respond to deep sternal rub.  Does not follow commands.  No verbalizations noted.  Cranial Nerves: II: patient does not respond confrontation bilaterally, pupils 6mm and fixed bilaterally III,IV,VI: doll's response absent bilaterally.  V,VII: corneal reflex absent bilaterally VIII: patient does not respond to verbal stimuli IX,X: gag reflex absent  XI: trapezius strength unable to test bilaterally XII: tongue strength unable to test Motor: Extremities flaccid throughout.  No spontaneous movement noted.  No purposeful movements noted. Sensory: Does not  respond to noxious stimuli in any extremity. Plantars: Mute bilaterally  Cerebellar: Unable to perform  Laboratory Studies:   Basic Metabolic Panel:  Recent Labs Lab 01/30/2014 0332 01/24/2014 0530 01/18/2014 0908  NA 129*  --  138  K 6.0*  --  3.1*  CL 86*  --  97  CO2 14*  --  26  GLUCOSE 354*  --  154*  BUN 22  --  25*  CREATININE 1.40* 1.35* 1.28*  CALCIUM 8.8  --  9.6    Liver Function Tests:  Recent Labs Lab 01/19/2014 0332  AST 151*  ALT 125*  ALKPHOS 178*  BILITOT <0.2*  PROT 6.9  ALBUMIN 2.2*   No results for input(s): LIPASE, AMYLASE in the last 168 hours. No results for input(s): AMMONIA in the last 168  hours.  CBC:  Recent Labs Lab 01/23/2014 0332  WBC 44.6*  NEUTROABS 33.5*  HGB 7.5*  HCT 27.2*  MCV 78.2  PLT 333    Cardiac Enzymes:  Recent Labs Lab 01/27/2014 0332 01/27/2014 0530  TROPONINI <0.30 <0.30    BNP: Invalid input(s): POCBNP  CBG:  Recent Labs Lab 02/11/2014 0331  GLUCAP 287*    Microbiology: Results for orders placed or performed during the hospital encounter of 10/19/13  Urine culture     Status: None   Collection Time: 10/19/13  3:45 PM  Result Value Ref Range Status   Specimen Description URINE, CATHETERIZED  Final   Special Requests NONE  Final   Culture  Setup Time   Final    10/20/2013 01:27 Performed at [facility]   Colony Count   Final    >=100,000 COLONIES/ML Performed at [facility]   Culture   Final    Multiple bacterial morphotypes present, none predominant. Suggest appropriate recollection if clinically indicated. Performed at [facility]   Report Status 10/21/2013 FINAL  Final  Culture, blood (routine x 2)     Status: None   Collection Time: 10/19/13  4:45 PM  Result Value Ref Range Status   Specimen Description BLOOD LEFT HAND  Final   Special Requests BOTTLES DRAWN AEROBIC AND ANAEROBIC 3ML  Final   Culture  Setup Time   Final    10/20/2013 01:27 Performed at [facility]   Culture   Final    NO GROWTH 5 DAYS Performed at [facility]   Report Status 10/26/2013 FINAL  Final  Culture, blood (routine x 2)     Status: None   Collection Time: 10/19/13  4:50 PM  Result Value Ref Range Status   Specimen Description BLOOD LEFT WRIST  Final   Special Requests BOTTLES DRAWN AEROBIC ONLY 3ML  Final   Culture  Setup Time   Final    10/20/2013 01:28 Performed at [facility]   Culture   Final    NO GROWTH 5 DAYS Performed at [facility]   Report Status 10/26/2013 FINAL  Final  MRSA PCR Screening     Status: None   Collection Time: 10/19/13 10:09 PM  Result Value Ref  Range Status   MRSA by PCR NEGATIVE NEGATIVE Final    Comment:        The GeneXpert MRSA Assay (FDA approved for NASAL specimens only), is one component of a comprehensive MRSA colonization surveillance program. It is not intended to diagnose MRSA infection nor to guide or monitor treatment for MRSA infections.  Culture, respiratory (NON-Expectorated)     Status: None  Collection Time: 10/20/13  4:28 PM  Result Value Ref Range Status   Specimen Description TRACHEAL ASPIRATE  Final   Special Requests NONE  Final   Gram Stain   Final    ABUNDANT WBC PRESENT,BOTH PMN AND MONONUCLEAR RARE SQUAMOUS EPITHELIAL CELLS PRESENT NO ORGANISMS SEEN Performed at [facility]   Culture   Final    MODERATE ACINETOBACTER CALCOACETICUS/BAUMANNII COMPLEX MODERATE PROTEUS MIRABILIS Note: Confirmed Extended Spectrum Beta-Lactamase Producer (ESBL) CRITICAL RESULT CALLED TO, READ BACK BY AND VERIFIED WITH: S. MAIN RN [id number] 10/24/13 GUSTK Performed at [facility]   Report Status 10/24/2013 FINAL  Final   Organism ID, Bacteria ACINETOBACTER CALCOACETICUS/BAUMANNII COMPLEX  Final   Organism ID, Bacteria PROTEUS MIRABILIS  Final      Susceptibility   Proteus mirabilis - MIC*    AMPICILLIN >=32 RESISTANT Resistant     AMPICILLIN/SULBACTAM >=32 RESISTANT Resistant     CEFAZOLIN >=64 RESISTANT Resistant     CEFEPIME RESISTANT      CEFTAZIDIME RESISTANT      CEFTRIAXONE >=64 RESISTANT Resistant     CIPROFLOXACIN >=4 RESISTANT Resistant     GENTAMICIN <=1 SENSITIVE Sensitive     IMIPENEM 2 SENSITIVE Sensitive     PIP/TAZO <=4 SENSITIVE Sensitive     TOBRAMYCIN <=1 SENSITIVE Sensitive     TRIMETH/SULFA 40 SENSITIVE Sensitive     * MODERATE PROTEUS MIRABILIS   Acinetobacter calcoaceticus/baumannii complex - MIC*    AMPICILLIN/SULBACTAM <=2 SENSITIVE Sensitive     CEFEPIME 16 INTERMEDIATE Intermediate     CEFTAZIDIME 8 SENSITIVE Sensitive     CEFTRIAXONE 16 INTERMEDIATE Intermediate      CIPROFLOXACIN >=4 RESISTANT Resistant     GENTAMICIN 4 SENSITIVE Sensitive     IMIPENEM <=0.25 SENSITIVE Sensitive     TOBRAMYCIN <=1 SENSITIVE Sensitive     TRIMETH/SULFA* <=20 SENSITIVE Sensitive      * SET UP TIME:  [id number]    * MODERATE ACINETOBACTER CALCOACETICUS/BAUMANNII COMPLEX    Coagulation Studies:  Recent Labs  01/19/2014 0332  LABPROT 17.4*  INR 1.41    Urinalysis:  Recent Labs Lab 02/03/2014 0553  COLORURINE YELLOW  LABSPEC 1.010  PHURINE 7.0  GLUCOSEU 100*  HGBUR LARGE*  BILIRUBINUR NEGATIVE  KETONESUR NEGATIVE  PROTEINUR 100*  UROBILINOGEN 0.2  NITRITE NEGATIVE  LEUKOCYTESUR LARGE*    Lipid Panel:  No results found for: CHOL, TRIG, HDL, CHOLHDL, VLDL, LDLCALC  HgbA1C:  Lab Results  Component Value Date   HGBA1C  06/28/2010    5.6 (NOTE)                                                                       According to the ADA Clinical Practice Recommendations for 2011, when HbA1c is used as a screening test:   >=6.5%   Diagnostic of Diabetes Mellitus           (if abnormal result  is confirmed)  5.7-6.4%   Increased risk of developing Diabetes Mellitus  References:Diagnosis and Classification of Diabetes Mellitus,Diabetes Care,2011,34(Suppl 1):S62-S69 and Standards of Medical Care in         Diabetes - 2011,Diabetes Care,2011,34  (Suppl 1):S11-S61.    Urine Drug Screen:  No results found for: LABOPIA, COCAINSCRNUR, LABBENZ, AMPHETMU,  THCU, LABBARB  Alcohol Level: No results for input(s): ETH in the last 168 hours.   Imaging: Ct Head Wo Contrast  01-26-14   CLINICAL DATA:  Cardiac arrest.  Status post CPR.  EXAM: CT HEAD WITHOUT CONTRAST  TECHNIQUE: Contiguous axial images were obtained from the base of the skull through the vertex without intravenous contrast.  COMPARISON:  CT of the head performed 12/10/2008  FINDINGS: There is diffuse partial loss of the normal gray-white differentiation of the cerebral and cerebellar hemispheres,  compatible with anoxic brain injury. Mild residual gray-white differentiation is seen, but diffuse parenchymal edema is noted, with partial effacement of the third and lateral ventricles, and effacement of the cisterns about the brainstem.  No significant midline shift is seen. There is no evidence of hemorrhagic transformation at this time. No masses are identified.  There is no evidence of fracture; visualized osseous structures are unremarkable in appearance. The orbits are within normal limits. The paranasal sinuses and mastoid air cells are well-aerated. No significant soft tissue abnormalities are seen.  IMPRESSION: Diffuse partial loss of normal gray-white differentiation at the cerebral and cerebellar hemispheres, compatible with anoxic brain injury. Associated diffuse parenchymal edema seen. No evidence of hemorrhagic transformation at this time.  These results were called by telephone at the time of interpretation on 01-26-14 at 5:35 am to Dr. Glynn Octave , who verbally acknowledged these results.   Electronically Signed   By: Roanna Raider M.D.   On: 01/26/2014 05:38   Dg Chest Portable 1 View  2014/01/26   CLINICAL DATA:  Tracheostomy tube positioning.  Initial encounter.  EXAM: PORTABLE CHEST - 1 VIEW  COMPARISON:  Chest radiograph performed earlier today at 3:43 a.m.  FINDINGS: The patient's tracheostomy tube is seen ending 4 cm above the carina. A left-sided chest port is noted ending about the mid to distal SVC.  There is persistent left basilar airspace opacity, which may reflect atelectasis or possibly aspiration. A small left pleural effusion is suspected. Mild vascular congestion is again noted. No pneumothorax is seen.  The cardiomediastinal silhouette is borderline enlarged. No acute osseous abnormalities are identified.  IMPRESSION: 1. Tracheostomy tube seen ending 4 cm above the carina. 2. Persistent left basilar airspace opacity may reflect atelectasis or possibly aspiration.  Suspect small left pleural effusion. 3. Mild vascular congestion and borderline cardiomegaly.   Electronically Signed   By: Roanna Raider M.D.   On: January 26, 2014 06:54   Dg Chest Portable 1 View  26-Jan-2014   CLINICAL DATA:  Status post cardiac arrest. Endotracheal tube placement. Initial encounter.  EXAM: PORTABLE CHEST - 1 VIEW  COMPARISON:  Chest radiograph from 10/22/2013  FINDINGS: The patient's endotracheal tube is seen ending at the carina. This should be retracted 2-3 cm.  A left-sided chest port is noted ending about the mid SVC.  Left basilar airspace opacification may reflect atelectasis or possibly aspiration. Vascular congestion is noted. A small left pleural effusion is seen. No pneumothorax is identified.  The cardiomediastinal silhouette is mildly enlarged. No displaced rib fractures are seen. Cervical spinal fusion hardware is partially imaged.  IMPRESSION: 1. Endotracheal tube seen ending at the carina. This should be retracted 2-3 cm. 2. Left basilar airspace opacification may reflect atelectasis or possibly aspiration. Small left pleural effusion seen. 3. Vascular congestion and mild cardiomegaly noted.  These results were called by telephone at the time of interpretation on 01/26/14 at 4:02 am to Dr. Glynn Octave, who verbally acknowledged these results.   Electronically Signed  By: Roanna Raider M.D.   On: 01/18/14 04:03     Assessment/Plan:  [name] is an 48 y.o. female quadriplegic with chronic trach, resident of [facility], found apneic and unresponsive with unknown down time. EMS dispatched, pt was asystole on arrival. CPR performed for 6 - 8 minutes and 2 rounds epi administered before ROSC. Then developed A.fib with RVR. On ED arrival, remained hypotensive. Started on epi drip in ED.   CT head from 12/04 images show findings consistent with anoxic brain injury.  History and neurological exam is unfortunately consistent with lack of brain stem function  and brain death. Can complete apnea test for confirmatory study.    Elspeth Cho, DO [facility] [phone]  If 7pm- 7am, please page neurology on call as listed in AMION. 01/18/14, 11:03 AM

## 2014-01-15 NOTE — ED Provider Notes (Signed)
CSN: [id number]     Arrival date & time 01/29/2014  0329 History  This chart was scribed for Sarah OctaveStephen Marv Alfrey, MD by Freida Busmaniana Omoyeni, ED Scribe. This patient was seen in room [facility] and the patient's care was started 3:25 AM.     Chief Complaint  Patient presents with  . Cardiac Arrest   LEVEL 5 CAVEAT due to acuity of medical condition    The history is provided by the EMS personnel. No language interpreter was used.     HPI Comments:  [name] is a 48 y.o. female  brought in by ambulance, who [facility]. Per EMS pt was found apneic and in asystole upon their arrival. They report unknown downtime. CPR was initiated on site for 6-8 minutes followed by 2 rounds of epi before a pulse was regained. EMS states pt has been in AFIB around 130 since. They report a BS of 342, last BP of 103/79, and strong femoral pulses. They placed an I.O. in the pt's right tibia en route.    Past Medical History  Diagnosis Date  . Respiratory failure   . Dysphagia   . Contracture of hand joint   . Anemia   . Neurogenic bladder   . Anxiety   . Schizophrenia   . Quadriplegia   . Hypertension   . CHF (congestive heart failure)   . Anginal pain   . Right leg DVT 1980's  . Pneumonia     "today; I get it alot" (07/21/2012)  . History of blood transfusion     "I've had a few" (07/21/2012)  . GERD (gastroesophageal reflux disease)   . Arthritis     "in my hands" (07/21/2012)  . Depression   . Kidney stones     "about 4; 2 different times" (6/92014)  . On home oxygen therapy     "38% q hs" (07/21/2012)   Past Surgical History  Procedure Laterality Date  . Tracheostomy  2011  . Gastrostomy w/ feeding tube    . Tubal ligation  1996  . Cystoscopy/retrograde/ureteroscopy/stone extraction with basket    . Lithotripsy    . Peg placement     History reviewed. No pertinent family history. History  Substance Use Topics  . Smoking status:  Former Smoker -- 2.00 packs/day for 4 years    Types: Cigarettes    Quit date: 11/17/2006  . Smokeless tobacco: Never Used  . Alcohol Use: No   OB History    No data available     Review of Systems  Unable to perform ROS: Acuity of condition      Allergies  Latex  Home Medications   Prior to Admission medications   Medication Sig Start Date End Date Taking? Authorizing Provider  albuterol (PROVENTIL) (2.5 MG/3ML) 0.083% nebulizer solution Take 2.5 mg by nebulization every 6 (six) hours.   Yes Historical Provider, MD  amLODipine (NORVASC) 5 MG tablet Take 5 mg by mouth daily.   Yes Historical Provider, MD  bisacodyl (DULCOLAX) 10 MG suppository Place 10 mg rectally every other day as needed for constipation.   Yes Historical Provider, MD  calcium-vitamin D (OSCAL 500/200 D-3) 500-200 MG-UNIT per tablet Take 1 tablet by mouth every morning.   Yes Historical Provider, MD  furosemide (LASIX) 20 MG tablet Take 1 tablet (20 mg total) by mouth 2 (two) times daily. 10/27/13  Yes Meredeth IdeGagan S Lama, MD  HYDROcodone-acetaminophen (NORCO/VICODIN) 5-325 MG per tablet Take one  tablet by mouth every 4 hours as needed for pain 10/29/13  Yes Tiffany L Reed, DO  ipratropium (ATROVENT) 0.02 % nebulizer solution Take 500 mcg by nebulization every 6 (six) hours.   Yes Historical Provider, MD  magnesium hydroxide (MILK OF MAGNESIA) 400 MG/5ML suspension Take 30 mLs by mouth every other day as needed for mild constipation.   Yes Historical Provider, MD  Multiple Vitamin (MULTIVITAMIN WITH MINERALS) TABS tablet Take 1 tablet by mouth daily.   Yes Historical Provider, MD  oxybutynin (DITROPAN) 5 MG tablet Take 5 mg by mouth every 8 (eight) hours as needed for bladder spasms.   Yes Historical Provider, MD  pantoprazole sodium (PROTONIX) 40 mg/20 mL PACK Take 40 mg by mouth daily.   Yes Historical Provider, MD  polyethylene glycol (MIRALAX / GLYCOLAX) packet Take 17 g by mouth daily. constipation   Yes Historical  Provider, MD  sennosides (SENOKOT) 8.8 MG/5ML syrup Take 10 mLs by mouth 2 (two) times daily. 10/27/13  Yes Meredeth Ide, MD  UNABLE TO FIND Take by mouth daily at 12 noon. Med Name: Magic Cup   Yes Historical Provider, MD  vitamin C (ASCORBIC ACID) 500 MG tablet Take 500 mg by mouth 2 (two) times daily.   Yes Historical Provider, MD  zinc sulfate 220 MG capsule Take 220 mg by mouth daily.   Yes Historical Provider, MD  acetaminophen (TYLENOL) 500 MG tablet Take 1,000 mg by mouth 3 (three) times daily.    Historical Provider, MD  aspirin 325 MG tablet Take 325 mg by mouth daily.    Historical Provider, MD  docusate sodium (COLACE) 100 MG capsule Take 100 mg by mouth 2 (two) times daily.    Historical Provider, MD  guaiFENesin (ROBITUSSIN) 100 MG/5ML SOLN Take 15 mLs by mouth every 8 (eight) hours. scheduled    Historical Provider, MD  metoprolol tartrate (LOPRESSOR) 25 MG tablet Take 50 mg by mouth 2 (two) times daily.     Historical Provider, MD  Nutritional Supplements (ENSURE COMPLETE SHAKE) LIQD Take 1 Can by mouth 2 (two) times daily. Patient not taking: Reported on 01/13/2014 10/27/13   Meredeth Ide, MD  oxyCODONE (ROXICODONE) 15 MG immediate release tablet Take one tablet by mouth twice daily; Take one tablet by mouth every 4 hours as needed for pain Patient not taking: Reported on 02/10/2014 10/29/13   Tiffany L Reed, DO  Polyvinyl Alcohol-Povidone (FRESHKOTE) 2.7-2 % SOLN Place 1 drop into both eyes 4 (four) times daily.    Historical Provider, MD  sucralfate (CARAFATE) 1 G tablet Take 1 tablet (1 g total) by mouth 4 (four) times daily -  with meals and at bedtime. Patient not taking: Reported on 01/29/2014 03/14/12   [name], MD  venlafaxine (EFFEXOR) 100 MG tablet Take 100 mg by mouth 2 (two) times daily.    Historical Provider, MD  ziprasidone (GEODON) 40 MG capsule Take 40 mg by mouth daily at 12 noon.    Historical Provider, MD  ziprasidone (GEODON) 80 MG capsule Take 80 mg by mouth  2 (two) times daily with a meal.    Historical Provider, MD   BP 149/85 mmHg  Pulse 108  Temp(Src) 96 F (35.6 C) (Oral)  Resp 16  Ht 5\' 2"  (1.575 m)  Wt 194 lb 0.1 oz (88 kg)  BMI 35.47 kg/m2  SpO2 100% Physical Exam  Constitutional: She appears distressed.  Morbidly Obese  HENT:  Head: Normocephalic and atraumatic.  Mouth/Throat: Oropharynx is  clear and moist.  Eyes:  6 mm and fixed  Neck: Normal range of motion. Neck supple.  ETT in tracheostomy  Cardiovascular:  Irregular tachycardia  Pulmonary/Chest: Breath sounds normal. No respiratory distress.  Breath sounds equal ETT in trach  No spontaneous respirations.  Abdominal: She exhibits distension.  Genitourinary:  Indwelling foley   Musculoskeletal:  Upper extremity contractures IO right tibia  Neurological:  Quagraplegia Unresponsive    Skin: Skin is warm and dry.  Nursing note and vitals reviewed.   ED Course  Procedures Labs Review Labs Reviewed  MRSA PCR SCREENING - Abnormal; Notable for the following:    MRSA by PCR POSITIVE (*)    All other components within normal limits  CBC WITH DIFFERENTIAL - Abnormal; Notable for the following:    WBC 44.6 (*)    RBC 3.48 (*)    Hemoglobin 7.5 (*)    HCT 27.2 (*)    MCH 21.6 (*)    MCHC 27.6 (*)    Neutro Abs 33.5 (*)    Lymphs Abs 8.9 (*)    Monocytes Absolute 1.8 (*)    All other components within normal limits  COMPREHENSIVE METABOLIC PANEL - Abnormal; Notable for the following:    Sodium 129 (*)    Potassium 6.0 (*)    Chloride 86 (*)    CO2 14 (*)    Glucose, Bld 354 (*)    Creatinine, Ser 1.40 (*)    Albumin 2.2 (*)    AST 151 (*)    ALT 125 (*)    Alkaline Phosphatase 178 (*)    Total Bilirubin <0.2 (*)    GFR calc non Af Amer 43 (*)    GFR calc Af Amer 50 (*)    Anion gap 29 (*)    All other components within normal limits  PROTIME-INR - Abnormal; Notable for the following:    Prothrombin Time 17.4 (*)    All other components within  normal limits  URINALYSIS, ROUTINE W REFLEX MICROSCOPIC - Abnormal; Notable for the following:    APPearance TURBID (*)    Glucose, UA 100 (*)    Hgb urine dipstick LARGE (*)    Protein, ur 100 (*)    Leukocytes, UA LARGE (*)    All other components within normal limits  CREATININE, SERUM - Abnormal; Notable for the following:    Creatinine, Ser 1.35 (*)    GFR calc non Af Amer 45 (*)    GFR calc Af Amer 52 (*)    All other components within normal limits  TROPONIN I - Abnormal; Notable for the following:    Troponin I 0.41 (*)    All other components within normal limits  LACTIC ACID, PLASMA - Abnormal; Notable for the following:    Lactic Acid, Venous 5.2 (*)    All other components within normal limits  BASIC METABOLIC PANEL - Abnormal; Notable for the following:    Potassium 3.1 (*)    Glucose, Bld 154 (*)    BUN 25 (*)    Creatinine, Ser 1.28 (*)    GFR calc non Af Amer 48 (*)    GFR calc Af Amer 56 (*)    All other components within normal limits  URINE MICROSCOPIC-ADD ON - Abnormal; Notable for the following:    Squamous Epithelial / LPF MANY (*)    Bacteria, UA MANY (*)    All other components within normal limits  BLOOD GAS, ARTERIAL - Abnormal; Notable for the following:  pO2, Arterial 155.0 (*)    Bicarbonate 28.5 (*)    Acid-Base Excess 4.4 (*)    All other components within normal limits  BLOOD GAS, ARTERIAL - Abnormal; Notable for the following:    pH, Arterial 7.291 (*)    pCO2 arterial 62.4 (*)    pO2, Arterial 213.0 (*)    Bicarbonate 29.1 (*)    Acid-Base Excess 3.1 (*)    All other components within normal limits  CBG MONITORING, ED - Abnormal; Notable for the following:    Glucose-Capillary 287 (*)    All other components within normal limits  CULTURE, BLOOD (ROUTINE X 2)  CULTURE, BLOOD (ROUTINE X 2)  URINE CULTURE  CULTURE, RESPIRATORY (NON-EXPECTORATED)  TROPONIN I  TROPONIN I  LACTIC ACID, PLASMA  PROCALCITONIN  TROPONIN I  LACTIC ACID,  PLASMA  LACTIC ACID, PLASMA  BASIC METABOLIC PANEL  BLOOD GAS, ARTERIAL  MAGNESIUM  PHOSPHORUS  PROCALCITONIN  CBC  BLOOD GAS, ARTERIAL  LACTIC ACID, PLASMA  I-STAT ARTERIAL BLOOD GAS, ED  I-STAT CG4 LACTIC ACID, ED  I-STAT CHEM 8, ED    Imaging Review Ct Head Wo Contrast  01/29/2014   CLINICAL DATA:  Cardiac arrest.  Status post CPR.  EXAM: CT HEAD WITHOUT CONTRAST  TECHNIQUE: Contiguous axial images were obtained from the base of the skull through the vertex without intravenous contrast.  COMPARISON:  CT of the head performed 12/10/2008  FINDINGS: There is diffuse partial loss of the normal gray-white differentiation of the cerebral and cerebellar hemispheres, compatible with anoxic brain injury. Mild residual gray-white differentiation is seen, but diffuse parenchymal edema is noted, with partial effacement of the third and lateral ventricles, and effacement of the cisterns about the brainstem.  No significant midline shift is seen. There is no evidence of hemorrhagic transformation at this time. No masses are identified.  There is no evidence of fracture; visualized osseous structures are unremarkable in appearance. The orbits are within normal limits. The paranasal sinuses and mastoid air cells are well-aerated. No significant soft tissue abnormalities are seen.  IMPRESSION: Diffuse partial loss of normal gray-white differentiation at the cerebral and cerebellar hemispheres, compatible with anoxic brain injury. Associated diffuse parenchymal edema seen. No evidence of hemorrhagic transformation at this time.  These results were called by telephone at the time of interpretation on 02/05/2014 at 5:35 am to Dr. Glynn OctaveSTEPHEN Zeina Akkerman , who verbally acknowledged these results.   Electronically Signed   By: Roanna RaiderJeffery  Chang M.D.   On: 2013-06-25 05:38   Nm Brain W Vasc Flow Min 4v  02/07/2014   CLINICAL DATA:  Patient unresponsive. Acute on chronic hypoxic respiratory failure.  EXAM: NM BRAIN SCAN WITH  FLOW - 4+ VIEW  TECHNIQUE: Radionuclide angiogram and static images of the brain were obtained after intravenous injection of radiopharmaceutical.  RADIOPHARMACEUTICALS:  20 mCi Ceretec IV.  COMPARISON:  Head CT scan 2013-06-25.  FINDINGS: No blood flow to the brain is identified.  IMPRESSION: Findings compatible with brain death.   Electronically Signed   By: Drusilla Kannerhomas  Dalessio M.D.   On: 2013-06-25 15:57   Dg Chest Portable 1 View  01/27/2014   CLINICAL DATA:  Tracheostomy tube positioning.  Initial encounter.  EXAM: PORTABLE CHEST - 1 VIEW  COMPARISON:  Chest radiograph performed earlier today at 3:43 a.m.  FINDINGS: The patient's tracheostomy tube is seen ending 4 cm above the carina. A left-sided chest port is noted ending about the mid to distal SVC.  There is persistent left basilar airspace  opacity, which may reflect atelectasis or possibly aspiration. A small left pleural effusion is suspected. Mild vascular congestion is again noted. No pneumothorax is seen.  The cardiomediastinal silhouette is borderline enlarged. No acute osseous abnormalities are identified.  IMPRESSION: 1. Tracheostomy tube seen ending 4 cm above the carina. 2. Persistent left basilar airspace opacity may reflect atelectasis or possibly aspiration. Suspect small left pleural effusion. 3. Mild vascular congestion and borderline cardiomegaly.   Electronically Signed   By: Roanna Raider M.D.   On: 01/25/2014 06:54   Dg Chest Portable 1 View  01/26/2014   CLINICAL DATA:  Status post cardiac arrest. Endotracheal tube placement. Initial encounter.  EXAM: PORTABLE CHEST - 1 VIEW  COMPARISON:  Chest radiograph from 10/22/2013  FINDINGS: The patient's endotracheal tube is seen ending at the carina. This should be retracted 2-3 cm.  A left-sided chest port is noted ending about the mid SVC.  Left basilar airspace opacification may reflect atelectasis or possibly aspiration. Vascular congestion is noted. A small left pleural effusion is  seen. No pneumothorax is identified.  The cardiomediastinal silhouette is mildly enlarged. No displaced rib fractures are seen. Cervical spinal fusion hardware is partially imaged.  IMPRESSION: 1. Endotracheal tube seen ending at the carina. This should be retracted 2-3 cm. 2. Left basilar airspace opacification may reflect atelectasis or possibly aspiration. Small left pleural effusion seen. 3. Vascular congestion and mild cardiomegaly noted.  These results were called by telephone at the time of interpretation on 02/03/2014 at 4:02 am to Dr. Glynn Carlson, who verbally acknowledged these results.   Electronically Signed   By: Roanna Raider M.D.   On: 01/13/2014 04:03     EKG Interpretation None      MDM   Final diagnoses:  Cardiac arrest  Trachea displaced   Post arrest from nursing home. Unknown downtime. Patient found with asystole received CPR for 8 minutes and 2 rounds of epinephrine. Intubated through tracheostomy.  EKG with atrial fibrillation, RVR, ST depression laterally. Discussed with Dr. Katrinka Blazing of interventional cardiology who agreed she is not an immediate cath lab candidate.  Lactate 13. K 5.7 with AKI. WBC 44.  Treat hyperkalemia with bicarb, calcium and insulin. Anemia near baseline.  RTT changed ETT back to 6-0 trach.  Possible aspiration on CXR. Consider PE as well.  Unable to CT due to AKI.  Epi gtt for persistent hypotension.  Amiodarone gtt for Afib rate control. Family updated. D/w Dr. Darrick Penna who will admit to ICU. CT head concerning for anoxic brain injury.   Date: 01/30/2014  Rate: 142  Rhythm: atrial fibrillation  QRS Axis: normal  Intervals: normal  ST/T Wave abnormalities: ST depressions laterally  Conduction Disutrbances:none  Narrative Interpretation:   Old EKG Reviewed: none available   CRITICAL CARE Performed by: [name] Total critical care time: 45 Critical care time was exclusive of separately billable procedures and treating  other patients. Critical care was necessary to treat or prevent imminent or life-threatening deterioration. Critical care was time spent personally by me on the following activities: development of treatment plan with patient and/or surrogate as well as nursing, discussions with consultants, evaluation of patient's response to treatment, examination of patient, obtaining history from patient or surrogate, ordering and performing treatments and interventions, ordering and review of laboratory studies, ordering and review of radiographic studies, pulse oximetry and re-evaluation of patient's condition.     Sarah Octave, MD 01/25/2014 [phone]

## 2014-01-15 NOTE — Progress Notes (Signed)
Nuclear medicine brain scan completed showing no blood flow to the brain. This result combined with lack of brain stem function is unfortunately consistent with a diagnosis of brain death. No further neurological evaluation indicated at this time. Please call with further questions.   Elspeth Choeter Zalika Tieszen, DO [facility] [phone]  If 7pm- 7am, please page neurology on call as listed in AMION.

## 2014-01-15 NOTE — Progress Notes (Signed)
ANTICOAGULATION CONSULT NOTE - Initial Consult  Pharmacy Consult for heparin Indication: r/o pe   Allergies  Allergen Reactions  . Latex Other (See Comments)    Reaction unknown    Patient Measurements: Height: 5\' 2"  (157.5 cm) Weight: 194 lb 0.1 oz (88 kg) IBW/kg (Calculated) : 50.1 Heparin Dosing Weight:   Vital Signs: Temp: 94.1 F (34.5 C) (12/04 0528) Temp Source: Axillary (12/04 0528) BP: 83/56 mmHg (12/04 0515) Pulse Rate: 62 (12/04 0515)  Labs:  Recent Labs  01/28/2014 0332  HGB 7.5*  HCT 27.2*  PLT 333  LABPROT 17.4*  INR 1.41  CREATININE 1.40*  TROPONINI <0.30    Estimated Creatinine Clearance: 50.1 mL/min (by C-G formula based on Cr of 1.4).   Medical History: Past Medical History  Diagnosis Date  . Respiratory failure   . Dysphagia   . Contracture of hand joint   . Anemia   . Neurogenic bladder   . Anxiety   . Schizophrenia   . Quadriplegia   . Hypertension   . CHF (congestive heart failure)   . Anginal pain   . Right leg DVT 1980's  . Pneumonia     "today; I get it alot" (07/21/2012)  . History of blood transfusion     "I've had a few" (07/21/2012)  . GERD (gastroesophageal reflux disease)   . Arthritis     "in my hands" (07/21/2012)  . Depression   . Kidney stones     "about 4; 2 different times" (6/92014)  . On home oxygen therapy     "38% q hs" (07/21/2012)    Medications:   (Not in a hospital admission)  Assessment: 48 yo quadraplegic with cardiac arrest. cpr 6-8 min rosc after 2 doses of epi. Heparin for r/o pe vs mi.  Goal of Therapy:  Heparin level 0.3-0.7 units/ml Monitor platelets by anticoagulation protocol: Yes   Plan:  Heparin 4000 unitsx1 then 1100 units/hr. HL in 6 hours.  Daily HL and cbc starting 12/5 with am labs.   Janice CoffinEarl, Kamira Mellette Jonathan 01/29/2014,5:49 AM

## 2014-01-15 NOTE — ED Notes (Signed)
MD at bedside. 

## 2014-01-15 NOTE — Progress Notes (Signed)
Inpatient Diabetes Program Recommendations  AACE/ADA: New Consensus Statement on Inpatient Glycemic Control (2013)  Target Ranges:  Prepandial:   less than 140 mg/dL      Peak postprandial:   less than 180 mg/dL (1-2 hours)      Critically ill patients:  140 - 180 mg/dL  Results for [name], Sarah (MRN [id number]) as of 01/28/2014 10:52  Ref. Range 01/14/2014 03:32 01/26/2014 09:08  Glucose Latest Range: 70-99 mg/dL 409354 (H) 811154 (H)   Results for [name], [name] (MRN [id number]) as of 01/25/2014 10:52  Ref. Range 01/27/2014 03:31  Glucose-Capillary Latest Range: 70-99 mg/dL 213287 (H)   Diabetes history: No Outpatient Diabetes medications: NA Current orders for Inpatient glycemic control: None  Inpatient Diabetes Program Recommendations Correction (SSI): If appropriate, may want to consider ordering CBGs with Novolog correction scale Q4H.  Note: Patient does not have a documented history of diabetes. Admitted following cardiac arrest. Noted initial hyperglycemia likely stress induced. Noted CBG 154 mg/dl at 0:869:08 am. If appropriate, may want to consider ordering CBGs with Novolog correction Q4H.   Thanks, Orlando PennerMarie Sidney Silberman, RN, MSN, CCRN, CDE Diabetes Coordinator Inpatient Diabetes Program [phone] (Team Pager) [phone] ([facility] office) [phone] [facility] office)

## 2014-01-15 NOTE — Progress Notes (Signed)
ANTIBIOTIC CONSULT NOTE - INITIAL  Pharmacy Consult for vancomycin  Indication: sepsis/aspiration  Allergies  Allergen Reactions  . Latex Other (See Comments)    Reaction unknown    Patient Measurements: Height: 5\' 2"  (157.5 cm) Weight: 194 lb 0.1 oz (88 kg) IBW/kg (Calculated) : 50.1 Adjusted Body Weight:   Vital Signs: BP: 83/56 mmHg (12/04 0515) Pulse Rate: 62 (12/04 0515) Intake/Output from previous day:   Intake/Output from this shift:    Labs:  Recent Labs  2013-09-10 0332  WBC 44.6*  HGB 7.5*  PLT 333  CREATININE 1.40*   Estimated Creatinine Clearance: 50.1 mL/min (by C-G formula based on Cr of 1.4). No results for input(s): VANCOTROUGH, VANCOPEAK, VANCORANDOM, GENTTROUGH, GENTPEAK, GENTRANDOM, TOBRATROUGH, TOBRAPEAK, TOBRARND, AMIKACINPEAK, AMIKACINTROU, AMIKACIN in the last 72 hours.   Microbiology: No results found for this or any previous visit (from the past 720 hour(s)).  Medical History: Past Medical History  Diagnosis Date  . Respiratory failure   . Dysphagia   . Contracture of hand joint   . Anemia   . Neurogenic bladder   . Anxiety   . Schizophrenia   . Quadriplegia   . Hypertension   . CHF (congestive heart failure)   . Anginal pain   . Right leg DVT 1980's  . Pneumonia     "today; I get it alot" (07/21/2012)  . History of blood transfusion     "I've had a few" (07/21/2012)  . GERD (gastroesophageal reflux disease)   . Arthritis     "in my hands" (07/21/2012)  . Depression   . Kidney stones     "about 4; 2 different times" (6/92014)  . On home oxygen therapy     "38% q hs" (07/21/2012)    Medications:   (Not in a hospital admission) Assessment: 48 yo quadriplegic presented to ED via ems with unknown downtime. cpr x 6-8 min with rosc after 2 doses of epi. Vanc and zosyn for aspiration vs sepsis.    Goal of Therapy:  Vancomycin trough level 15-20 mcg/ml  Plan:  Vancomycin 1gm q12h  Zosyn 3.375 gm q8h   Janice CoffinEarl, Aisling Emigh  Jonathan 01/31/2014,5:25 AM

## 2014-01-15 NOTE — ED Notes (Signed)
Pt here by [facility] for CPR, pt from [facility], pt on ems arrival no pulse and no breathing, pt initially asystole cpr performed for 6-8 minutes and then pea given 2 epi and had pulse of 130 a fib, pt had femoral pulses, depression on 12 lead, 7.0 tube placed by ems, i/o to right tib, pt cbg with ems 342.

## 2014-01-15 NOTE — Progress Notes (Signed)
Patient's tracheostomy was removed in the field by EMS because they didn't have a cuffed trach to ventilate the patient. EMS inserted a 7.0 ET tube into stoma secured at 16cm.  Patient had a #6 cuffless trach at SNF. Two RT's present to insert a  #6 Shiley cuffed trach with  positive color change(ETCO2 detector) and no complications.

## 2014-01-15 NOTE — Progress Notes (Signed)
Apnea test performed with MD for total time of 7 minutes.  Patient disconnected from ventilator with 8L flow of oxygen bled in.  No apparent complications.  ABG drawn and sent for result.  Will inform MD of ABG results.  RT will continue to monitor.

## 2014-01-15 NOTE — Progress Notes (Signed)
Spoke with patient's sister and POA: Annice PihJackie Scottin to update her about perfusion scan and lack of brainstem function. She will be coming to the hospital in the morning.  She gives permission for any and all information to be shared with the patient's daughter, Darien Ramusna.  She would like her to be updated to her condition.

## 2014-01-15 NOTE — H&P (Signed)
PULMONARY / CRITICAL CARE MEDICINE   Name: [name] MRN: [id number] DOB: 1965/09/13    ADMISSION DATE:  01-18-2014 CONSULTATION DATE:  2014-01-18  REFERRING MD :  EDP  CHIEF COMPLAINT:  S/p cardiac arrest  INITIAL PRESENTATION:  48 y.o. F, quadriplegic with chronic trach, resident of [facility], found apneic and unresponsive with unknown down time.  EMS dispatched, pt was asystole on arrival.  CPR performed for 6 - 8 minutes and 2 rounds epi administered before ROSC.  Then developed A.fib with RVR.  On ED arrival, remained hypotensive.  Started on epi drip in ED.  PCCM called for admission.   STUDIES:  CT Head 12/4 >>> anoxic brain injury, associated diffuse parenchymal edema seen, no hemorrhage. LE Dopplers 12/4 >>>  SIGNIFICANT EVENTS: 12/4 - asystolic arrest then A.fib with RVR, admitted.   HISTORY OF PRESENT ILLNESS:  Pt is encephalopathic; therefore, this HPI is obtained from chart review. [name] is a 48 y.o. 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  She is a resident of [facility].  During early AM hours of 12/4, staff found her apneic and unresponsive with unknown downtime.  EMS was dispatched and on their arrival, pt was noted to be asystole.  CPR was performed for 6 - 8 minutes and 2 rounds of epi was given before ROSC.  Pt then developed A.fib with RVR, rate in 130's. She was brought to [facility] ED where she remained unresponsive.  She was also hypotensive and was placed on epi gtt (MAP now 60 with epi at 1.5). In ED, noted to be hyperkalemic, AG 29, WBC 45.  PCCM was called for admission.   PAST MEDICAL HISTORY :   has a past medical history of Respiratory failure; Dysphagia; Contracture of hand joint; Anemia; Neurogenic bladder; Anxiety; Schizophrenia; Quadriplegia; Hypertension; CHF (congestive heart failure); Anginal pain; Right leg DVT (1980's); Pneumonia; History of blood  transfusion; GERD (gastroesophageal reflux disease); Arthritis; Depression; Kidney stones; and On home oxygen therapy.  has past surgical history that includes Tracheostomy (2011); Gastrostomy w/ feeding tube; Tubal ligation (1996); Cystoscopy/retrograde/ureteroscopy/stone extraction with basket; Lithotripsy; and PEG placement. Prior to Admission medications   Medication Sig Start Date End Date Taking? Authorizing Provider  acetaminophen (TYLENOL) 500 MG tablet Take 1,000 mg by mouth 3 (three) times daily.    Historical Provider, MD  albuterol (PROVENTIL) (2.5 MG/3ML) 0.083% nebulizer solution Take 2.5 mg by nebulization every 6 (six) hours.    Historical Provider, MD  amLODipine (NORVASC) 5 MG tablet Take 5 mg by mouth daily.    Historical Provider, MD  aspirin 325 MG tablet Take 325 mg by mouth daily.    Historical Provider, MD  bisacodyl (DULCOLAX) 10 MG suppository Place 10 mg rectally every other day as needed for constipation.    Historical Provider, MD  calcium-vitamin D (OSCAL 500/200 D-3) 500-200 MG-UNIT per tablet Take 1 tablet by mouth every morning.    Historical Provider, MD  docusate sodium (COLACE) 100 MG capsule Take 100 mg by mouth 2 (two) times daily.    Historical Provider, MD  furosemide (LASIX) 20 MG tablet Take 1 tablet (20 mg total) by mouth 2 (two) times daily. 10/27/13   Meredeth Ide, MD  guaiFENesin (ROBITUSSIN) 100 MG/5ML SOLN Take 15 mLs by mouth every 8 (eight) hours. scheduled    Historical Provider, MD  HYDROcodone-acetaminophen (NORCO/VICODIN) 5-325 MG per tablet Take one tablet by  mouth every 4 hours as needed for pain 10/29/13   Tiffany L Reed, DO  ipratropium (ATROVENT) 0.02 % nebulizer solution Take 500 mcg by nebulization every 6 (six) hours.    Historical Provider, MD  magnesium hydroxide (MILK OF MAGNESIA) 400 MG/5ML suspension Take 30 mLs by mouth every other day as needed for mild constipation.    Historical Provider, MD  metoprolol tartrate (LOPRESSOR) 25 MG  tablet Take 50 mg by mouth 2 (two) times daily.     Historical Provider, MD  Nutritional Supplements (ENSURE COMPLETE SHAKE) LIQD Take 1 Can by mouth 2 (two) times daily. 10/27/13   Meredeth IdeGagan S Lama, MD  oxyCODONE (ROXICODONE) 15 MG immediate release tablet Take one tablet by mouth twice daily; Take one tablet by mouth every 4 hours as needed for pain 10/29/13   Tiffany L Reed, DO  pantoprazole sodium (PROTONIX) 40 mg/20 mL PACK Take 40 mg by mouth daily.    Historical Provider, MD  polyethylene glycol (MIRALAX / GLYCOLAX) packet Take 17 g by mouth daily. constipation    Historical Provider, MD  Polyvinyl Alcohol-Povidone (FRESHKOTE) 2.7-2 % SOLN Place 1 drop into both eyes 4 (four) times daily.    Historical Provider, MD  sennosides (SENOKOT) 8.8 MG/5ML syrup Take 10 mLs by mouth 2 (two) times daily. 10/27/13   Meredeth IdeGagan S Lama, MD  sucralfate (CARAFATE) 1 G tablet Take 1 tablet (1 g total) by mouth 4 (four) times daily -  with meals and at bedtime. 03/14/12   Ripudeep Jenna LuoK Rai, MD  venlafaxine (EFFEXOR) 100 MG tablet Take 100 mg by mouth 2 (two) times daily.    Historical Provider, MD  ziprasidone (GEODON) 40 MG capsule Take 40 mg by mouth daily at 12 noon.    Historical Provider, MD  ziprasidone (GEODON) 80 MG capsule Take 80 mg by mouth 2 (two) times daily with a meal.    Historical Provider, MD   Allergies  Allergen Reactions  . Latex Other (See Comments)    Reaction unknown    FAMILY HISTORY:  History reviewed. No pertinent family history.  SOCIAL HISTORY:  reports that she quit smoking about 7 years ago. Her smoking use included Cigarettes. She has a 8 pack-year smoking history. She has never used smokeless tobacco. She reports that she does not drink alcohol or use illicit drugs.  REVIEW OF SYSTEMS:  Unable to obtain as pt is encephalopathic.  SUBJECTIVE:   VITAL SIGNS: SpO2:  [96 %] 96 % (12/04 0351) HEMODYNAMICS:   VENTILATOR SETTINGS:   INTAKE / OUTPUT: Intake/Output    None      PHYSICAL EXAMINATION: General: Chronically ill appearing female, non-responsive. Neuro: Fully non-responsive, pupils fixed and dilated, no corneal or gag reflexes, no dolls eye and no respiratory drive. HEENT: [place]/AT. PERRL, sclerae anicteric. Trach in place. Cardiovascular: IRIR, no M/R/G.  Lungs: Respirations even and unlabored.  Coarse rhonchi. Abdomen: Obese, BS x 4, soft, NT/ND.  Musculoskeletal: Bilateral UE contractures, no edema.  Skin: Intact, warm, no rashes.  LABS:  CBC  Recent Labs Lab June 09, 2013 0332  WBC 44.6*  HGB 7.5*  HCT 27.2*  PLT 333   Coag's  Recent Labs Lab June 09, 2013 0332  INR 1.41   BMET  Recent Labs Lab June 09, 2013 0332  NA 129*  K 6.0*  CL 86*  CO2 14*  BUN 22  CREATININE 1.40*  GLUCOSE 354*   Electrolytes  Recent Labs Lab June 09, 2013 0332  CALCIUM 8.8   Sepsis Markers No results for input(s): LATICACIDVEN,  PROCALCITON, O2SATVEN in the last 168 hours. ABG No results for input(s): PHART, PCO2ART, PO2ART in the last 168 hours. Liver Enzymes  Recent Labs Lab 02/08/14 0332  AST 151*  ALT 125*  ALKPHOS 178*  BILITOT <0.2*  ALBUMIN 2.2*   Cardiac Enzymes  Recent Labs Lab 02-08-2014 0332  TROPONINI <0.30   Glucose  Recent Labs Lab 02/08/14 0331  GLUCAP 287*   Imaging Dg Chest Portable 1 View  02/08/2014   CLINICAL DATA:  Status post cardiac arrest. Endotracheal tube placement. Initial encounter.  EXAM: PORTABLE CHEST - 1 VIEW  COMPARISON:  Chest radiograph from 10/22/2013  FINDINGS: The patient's endotracheal tube is seen ending at the carina. This should be retracted 2-3 cm.  A left-sided chest port is noted ending about the mid SVC.  Left basilar airspace opacification may reflect atelectasis or possibly aspiration. Vascular congestion is noted. A small left pleural effusion is seen. No pneumothorax is identified.  The cardiomediastinal silhouette is mildly enlarged. No displaced rib fractures are seen. Cervical spinal fusion  hardware is partially imaged.  IMPRESSION: 1. Endotracheal tube seen ending at the carina. This should be retracted 2-3 cm. 2. Left basilar airspace opacification may reflect atelectasis or possibly aspiration. Small left pleural effusion seen. 3. Vascular congestion and mild cardiomegaly noted.  These results were called by telephone at the time of interpretation on 02-08-2014 at 4:02 am to Dr. Glynn Octave, who verbally acknowledged these results.   Electronically Signed   By: Roanna Raider M.D.   On: 2014/02/08 04:03   ASSESSMENT / PLAN:  PULMONARY Trach 12/4 (note, replaced in ED as EMS removed trach and inserted ETT directly into stoma) >>> A: Acute on chronic hypoxic respiratory failure ? PE - unable to perform CTA at this time due to AKI. ? Aspiration Left pleural effusion Tracheostomy status P:   Full mechanical support, patient has no respiratory drive however, so no weaning. Apnea test today. VAP bundle. Heparin gtt empirically for now. LE dopplers ordered but will hold until apnea testing. ABG and CXR in AM.  CARDIOVASCULAR Port left chest 9/15 >>> A:  Asystolic cardiac arrest Shock - likely cardiogenic A.fib RVR Hx HTN, CHF - last echo from 10/23/13: EF 70% P:  D/C pressors, patient is a full DNR. Trend troponin / lactate. TTE held until apnea testing is complete. Amiodarone gtt. Hold outpatient amlodipine, lasix, lopressor.  RENAL A:   Hyponatremia Hyperkalemia - s/p Ca, insulin, bicarb in ED AKI AG metabolic acidosis - presumed lactate P:   NS at 125 ml/hr. Replace electrolytes as indicated. Trend lactate. BMP in AM.  GASTROINTESTINAL A:   GI prophylaxis Protein calorie malnutrition - Albumin 2.2 Nutrition P:   SUP: Pantoprazole. NPO. TF if not brain dead.  HEMATOLOGIC A:   Anemia of chronic disease and critical illness ? DVT / PE P:  Transfuse per usual ICU guidelines. LE dopplers if not brain dead. Heparin gtt empirically. CBC in  AM.  INFECTIOUS A:   ? Aspiration P:   BCx2 12/4 >>> UCx 12/4 >>> Sputum Cx 12/4 >>> Abx: Vanc, start date 12/4, day 1/x. Abx: Zosyn, start date 12/4, day 1/x. PCT algorithm to limit abx exposure.  ENDOCRINE A:   No known issues P:   SSI if glucose consistently > 180.  NEUROLOGIC A:   Acute metabolic encephalopathy due to cardiac arrest and respiratory failure Probable anoxic brain injury due to prolonged down time (also suggested on CT) Quadriplegia Hx anxiety, depression, schizophrenia Head CT with  diffuse edema, neuro testing completely flat P:   No sedation as remains unresponsive Hold outpatient norco, roxicodone, effexor, ziprasidone. Neuro consult for brain death testing Apnea test as ordered.  Family updated: Sister is POA according to her.  Discussed case and after discussion decision was made to make patient DNR.  They requested a formal brain death testing to make sure and requested and autopsy.  Autopsy is not medically indicated so case management will work out detail with family upon patient's death.  Interdisciplinary Family Meeting v Palliative Care Meeting:  Due by: 12/11.  Rutherford Guysahul Desai, PA - [facility] [place] [facility] Pgr: [phone]  or [phone] 01/14/2014, 4:37 AM  Above note edited in full and my contributions to it have been added above.  The patient is critically ill with multiple organ systems failure and requires high complexity decision making for assessment and support, frequent evaluation and titration of therapies, application of advanced monitoring technologies and extensive interpretation of multiple databases.   Critical Care Time devoted to patient care services described in this note is  60  Minutes. This time reflects time of care of this signee Dr Koren BoundWesam Yacoub. This critical care time does not reflect procedure time, or teaching time or supervisory time of PA/NP/Med student/Med Resident etc but could  involve care discussion time.  Alyson ReedyWesam G. Yacoub, M.D. [facility]. Pager: [phone]. After hours pager: [phone].

## 2014-01-15 NOTE — ED Notes (Signed)
Pt returned from CT and placed on monitor in room, family now at bedside and has spoken with PCCM MD, Pulse OX probe and EKG leads shows differences in Heart Rate, EKG leads accurate.

## 2014-01-15 NOTE — Progress Notes (Addendum)
Apnea test performed.  CO2 rose by 19, patient had no efforts.  Neurology feels patient is brain dead as well.  Will order a perfusion scan after review of apnea test [facility].  Will not repeat apnea test given bradycardia towards the end of the test.  Additional CC time of 30 min.  Alyson ReedyWesam G. Jozee Hammer, M.D. [facility]. Pager: [phone]. After hours pager: [phone].

## 2014-01-16 DIAGNOSIS — F430 Acute stress reaction: Secondary | ICD-10-CM

## 2014-01-16 DIAGNOSIS — G9382 Brain death: Secondary | ICD-10-CM

## 2014-01-16 LAB — URINE CULTURE

## 2014-01-16 MED FILL — Medication: Qty: 1 | Status: AC

## 2014-01-17 LAB — POCT I-STAT 3, ART BLOOD GAS (G3+)
ACID-BASE EXCESS: 5 mmol/L — AB (ref 0.0–2.0)
Bicarbonate: 28.5 mEq/L — ABNORMAL HIGH (ref 20.0–24.0)
O2 SAT: 98 %
TCO2: 30 mmol/L (ref 0–100)
pCO2 arterial: 35.1 mmHg (ref 35.0–45.0)
pH, Arterial: 7.517 — ABNORMAL HIGH (ref 7.350–7.450)
pO2, Arterial: 102 mmHg — ABNORMAL HIGH (ref 80.0–100.0)

## 2014-01-21 LAB — CULTURE, BLOOD (ROUTINE X 2)
CULTURE: NO GROWTH
Culture: NO GROWTH

## 2014-01-29 NOTE — Discharge Summary (Signed)
[place]:  Carlson, Sarah              ACCOUNT NO.:  [id number]  MEDICAL RECORD NO.:  [id number]  LOCATION:                                 FACILITY:  PHYSICIAN:  Felipa EvenerWesam Jake Yacoub, MD  DATE OF BIRTH:  Jul 05, 1965  DATE OF ADMISSION:  02/08/2014 DATE OF DISCHARGE:  01/29/2014                              DISCHARGE SUMMARY   DEATH SUMMARY  PRIMARY DIAGNOSIS/CAUSE OF DEATH:  Pulseless electrical activity cardiac arrest.  SECONDARY DIAGNOSES:  Brain death, acute anoxic injury, refractory cardiogenic shock, pleural effusion, status post tracheostomy, cardiogenic shock, hyponatremia, hyperkalemia, acute metabolic acidosis, protein-calorie malnutrition, questionable aspiration, quadriplegia, schizophrenia.  The patient is a 48 year old female with extensive past medical history, presented to the hospital after [facility].  The patient was brought to the emergency department.  She has a chronic trach, was placed back on the ventilator.  Decision was made not to undergo hypothermia given the fact that there was no known downtime.  Following day, the neurologic exam was consistent with brain death.  Perfusion scan was also consistent with brain death as well as apnea testing.  Neurology was consulted, they agreed that the patient is brain dead.  Multiple phone calls were made to the family to inform them the information; however, they failed to call back or respond.  The patient developed refractory shock and had repeat cardiac arrest, at which point, decision was made not to perform ACLS as per previous discussion with the family and the patient was declared brain death and was taken off the ventilator.     Felipa EvenerWesam Jake Yacoub, MD     WJY/MEDQ  D:  01/27/2014  T:  01/28/2014  Job:  [id number]

## 2014-02-12 NOTE — Progress Notes (Signed)
Potassium results called to elink. No further orders.

## 2014-02-12 NOTE — Progress Notes (Signed)
Took patient of off vent per MD's orders

## 2014-02-12 NOTE — Progress Notes (Signed)
   Hypotensive  Plan Stop heparin gtt and amio gtt Await bedside MD > 7am to sort out brain death issue   Dr. Kalman ShanMurali Vinetta Brach, M.D., [facility] Pulmonary and Critical Care Medicine Staff Physician [facility] [place] [facility] and [facility] Pager: [phone], If no answer or between  15:00h - 7:00h: call [phone]  01/30/2014 4:26 AM

## 2014-02-12 NOTE — Progress Notes (Signed)
   RN calls says patient is brain dead but has lab orders  Review of chart shows patient family wil arrive in morning after 7am  Code Status     Code Status Orders        Start     Ordered   02/02/2014 1041  Do not attempt resuscitation (DNR)   Continuous    Question Answer Comment  Maintain current active treatments Yes   Do not initiate new interventions Yes      01/19/2014 1041         PLAN  dc lab tests  Dr. Kalman ShanMurali Debera Sterba, M.D., [facility] Pulmonary and Critical Care Medicine Staff Physician [place] [facility] [place] [facility] Critical [facility] Pager: [phone], If no answer or between  15:00h - 7:00h: call [phone]  02/11/2014 3:48 AM

## 2014-02-12 NOTE — Progress Notes (Signed)
Ascultation was performed for one minute by Carlton AdamSarah Herbert, RN and I, to listen for heart and lung sounds.  No heart or lung sounds were heard.  [facility] was called. All patient belongings were given to family at bedside and condolences were given.

## 2014-02-12 NOTE — Progress Notes (Signed)
   02/06/2014 1200  Clinical Encounter Type  Visited With Patient and family together;Health care provider  Visit Type Initial;Critical Care;Patient actively dying  Referral From Family;Nurse  Spiritual Encounters  Spiritual Needs Prayer;Grief support  Stress Factors  Family Stress Factors Loss   Chaplain was paged to patient's room at 11:24AM. Chaplain was notified that life support for the patient would be withdrawn today. Chaplain visited patient's room but one of the patient's family members desired that the chaplain come back a few minutes later when all of the family members were present. Chaplain returned to patients room around noon. Patient's sister and two daughters were present. Patient's sister explained that they were ready to let go of the patient and let her pass. Patient's sister said that they wished to use today to say goodbye and asked the chaplain to begin that time of saying goodbye with a prayer. Patient's sister also had some angst about the patient being forgiven by God. Chaplain prayed with patient's family. Chaplain prayed for forgiveness and a peaceful transition for the patient. Chaplain also prayed for God's presence as the family continue to grieve their loss. Chaplain will continue to provide emotional and spiritual support for patient and patient's family as needed. [place] NeighborStrother, Lanessa Shill R, Chaplain  12:31 PM

## 2014-02-12 NOTE — Progress Notes (Signed)
Pts BP dropping at this time. SBP 70s. Sister, Adela LankJacqueline called and made aware. Also daughter Darien Ramusna called and made aware.

## 2014-02-12 NOTE — H&P (Signed)
PULMONARY / CRITICAL CARE MEDICINE   Name: [name] MRN: [id number] DOB: 1965/03/01    ADMISSION DATE:  02/05/2014 CONSULTATION DATE:  01/19/2014  REFERRING MD :  EDP  CHIEF COMPLAINT:  S/p cardiac arrest  INITIAL PRESENTATION:  49 y.o. F, quadriplegic with chronic trach, resident of [facility], found apneic and unresponsive with unknown down time.  EMS dispatched, pt was asystole on arrival.  CPR performed for 6 - 8 minutes and 2 rounds epi administered before ROSC.  Then developed A.fib with RVR.  On ED arrival, remained hypotensive.  Started on epi drip in ED.  PCCM called for admission.   STUDIES:  CT Head 12/4 >>> anoxic brain injury, associated diffuse parenchymal edema seen, no hemorrhage. LE Dopplers 12/4 >>>  SIGNIFICANT EVENTS: 12/4 - asystolic arrest then A.fib with RVR, admitted.   HISTORY OF PRESENT ILLNESS:  Pt is encephalopathic; therefore, this HPI is obtained from chart review. [name] is a 49 y.o. 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  She is a resident of [facility].  During early AM hours of 12/4, staff found her apneic and unresponsive with unknown downtime.  EMS was dispatched and on their arrival, pt was noted to be asystole.  CPR was performed for 6 - 8 minutes and 2 rounds of epi was given before ROSC.  Pt then developed A.fib with RVR, rate in 130's. [facility] where she remained unresponsive.  She was also hypotensive and was placed on epi gtt (MAP now 60 with epi at 1.5). In ED, noted to be hyperkalemic, AG 29, WBC 45.  PCCM was called for admission.  SUBJECTIVE: Profoundly hypotensive overnight, perfusion scan confirms brain death.  VITAL SIGNS: Temp:  [96 F (35.6 C)-97.6 F (36.4 C)] 97.6 F (36.4 C) (12/05 0800) Pulse Rate:  [56-132] 108 (12/05 0900) Resp:  [10-22] 14 (12/05 0900) BP: (48-149)/(32-109) 48/32 mmHg (12/05 0900) SpO2:   [96 %-100 %] 100 % (12/05 0900) FiO2 (%):  [40 %] 40 % (12/05 0900) Weight:  [85 kg (187 lb 6.3 oz)] 85 kg (187 lb 6.3 oz) (12/05 0312) HEMODYNAMICS:   VENTILATOR SETTINGS: Vent Mode:  [-] PRVC FiO2 (%):  [40 %] 40 % Set Rate:  [10 bmp-14 bmp] 14 bmp Vt Set:  [500 mL] 500 mL PEEP:  [5 cmH20] 5 cmH20 Plateau Pressure:  [20 cmH20-23 cmH20] 20 cmH20 INTAKE / OUTPUT: Intake/Output      12/04 0701 - 12/05 0700 12/05 0701 - 12/06 0700   I.V. (mL/kg) 2974.1 (35)    IV Piggyback 250    Total Intake(mL/kg) 3224.1 (37.9)    Urine (mL/kg/hr) 4150 (2) 150 (0.6)   Total Output 4150 150   Net -925.9 -150          PHYSICAL EXAMINATION: General: Chronically ill appearing female, non-responsive. Neuro: Fully non-responsive, pupils fixed and dilated, no corneal or gag reflexes, no dolls eye and no respiratory drive. HEENT: [place]/AT. PERRL, sclerae anicteric. Trach in place. Cardiovascular: IRIR, no M/R/G.  Lungs: Respirations even and unlabored.  Coarse rhonchi. Abdomen: Obese, BS x 4, soft, NT/ND.  Musculoskeletal: Bilateral UE contractures, no edema.  Skin: Intact, warm, no rashes.  LABS:  CBC  Recent Labs Lab 02/11/2014 0332 01/26/2014 0346  WBC 44.6*  --   HGB 7.5* 9.2*  HCT 27.2* 27.0*  PLT 333  --    Coag's  Recent Labs  Lab 2014-01-27 0332  INR 1.41   BMET  Recent Labs Lab 01-27-14 0332 01-27-2014 0346 01/27/2014 0530 01/27/14 0908  NA 129* 129*  --  138  K 6.0* 5.7*  --  3.1*  CL 86* 96  --  97  CO2 14*  --   --  26  BUN 22 25*  --  25*  CREATININE 1.40* 1.80* 1.35* 1.28*  GLUCOSE 354* 363*  --  154*   Electrolytes  Recent Labs Lab 2014/01/27 0332 01/27/14 0908  CALCIUM 8.8 9.6   Sepsis Markers  Recent Labs Lab 2014/01/27 0345 01/27/2014 0542 27-Jan-2014 0908 27-Jan-2014 1110  LATICACIDVEN 13.56* 5.2*  --  1.5  PROCALCITON  --   --  16.55  --    ABG  Recent Labs Lab 27-Jan-2014 0427 01/27/2014 1130 Jan 27, 2014 1200  PHART 7.301* 7.433 7.291*  PCO2ART 44.1 43.4  62.4*  PO2ART 247.0* 155.0* 213.0*   Liver Enzymes  Recent Labs Lab 01-27-14 0332  AST 151*  ALT 125*  ALKPHOS 178*  BILITOT <0.2*  ALBUMIN 2.2*   Cardiac Enzymes  Recent Labs Lab Jan 27, 2014 0332 01/27/2014 0530 27-Jan-2014 1110  TROPONINI <0.30 <0.30 0.41*   Glucose  Recent Labs Lab 01/27/2014 0331  GLUCAP 287*   Imaging Ct Head Wo Contrast  01/27/2014   CLINICAL DATA:  Cardiac arrest.  Status post CPR.  EXAM: CT HEAD WITHOUT CONTRAST  TECHNIQUE: Contiguous axial images were obtained from the base of the skull through the vertex without intravenous contrast.  COMPARISON:  CT of the head performed 12/10/2008  FINDINGS: There is diffuse partial loss of the normal gray-white differentiation of the cerebral and cerebellar hemispheres, compatible with anoxic brain injury. Mild residual gray-white differentiation is seen, but diffuse parenchymal edema is noted, with partial effacement of the third and lateral ventricles, and effacement of the cisterns about the brainstem.  No significant midline shift is seen. There is no evidence of hemorrhagic transformation at this time. No masses are identified.  There is no evidence of fracture; visualized osseous structures are unremarkable in appearance. The orbits are within normal limits. The paranasal sinuses and mastoid air cells are well-aerated. No significant soft tissue abnormalities are seen.  IMPRESSION: Diffuse partial loss of normal gray-white differentiation at the cerebral and cerebellar hemispheres, compatible with anoxic brain injury. Associated diffuse parenchymal edema seen. No evidence of hemorrhagic transformation at this time.  These results were called by telephone at the time of interpretation on 2014-01-27 at 5:35 am to Dr. Glynn Octave , who verbally acknowledged these results.   Electronically Signed   By: Roanna Raider M.D.   On: 01/27/2014 05:38   Nm Brain W Vasc Flow Min 4v  27-Jan-2014   CLINICAL DATA:  Patient  unresponsive. Acute on chronic hypoxic respiratory failure.  EXAM: NM BRAIN SCAN WITH FLOW - 4+ VIEW  TECHNIQUE: Radionuclide angiogram and static images of the brain were obtained after intravenous injection of radiopharmaceutical.  RADIOPHARMACEUTICALS:  20 mCi Ceretec IV.  COMPARISON:  Head CT scan January 27, 2014.  FINDINGS: No blood flow to the brain is identified.  IMPRESSION: Findings compatible with brain death.   Electronically Signed   By: Drusilla Kanner M.D.   On: 01/27/2014 15:57   Dg Chest Portable 1 View  27-Jan-2014   CLINICAL DATA:  Tracheostomy tube positioning.  Initial encounter.  EXAM: PORTABLE CHEST - 1 VIEW  COMPARISON:  Chest radiograph performed earlier today at 3:43 a.m.  FINDINGS: The patient's tracheostomy tube is seen ending 4 cm  above the carina. A left-sided chest port is noted ending about the mid to distal SVC.  There is persistent left basilar airspace opacity, which may reflect atelectasis or possibly aspiration. A small left pleural effusion is suspected. Mild vascular congestion is again noted. No pneumothorax is seen.  The cardiomediastinal silhouette is borderline enlarged. No acute osseous abnormalities are identified.  IMPRESSION: 1. Tracheostomy tube seen ending 4 cm above the carina. 2. Persistent left basilar airspace opacity may reflect atelectasis or possibly aspiration. Suspect small left pleural effusion. 3. Mild vascular congestion and borderline cardiomegaly.   Electronically Signed   By: Roanna Raider M.D.   On: January 25, 2014 06:54   Dg Chest Portable 1 View  01/25/2014   CLINICAL DATA:  Status post cardiac arrest. Endotracheal tube placement. Initial encounter.  EXAM: PORTABLE CHEST - 1 VIEW  COMPARISON:  Chest radiograph from 10/22/2013  FINDINGS: The patient's endotracheal tube is seen ending at the carina. This should be retracted 2-3 cm.  A left-sided chest port is noted ending about the mid SVC.  Left basilar airspace opacification may reflect atelectasis or  possibly aspiration. Vascular congestion is noted. A small left pleural effusion is seen. No pneumothorax is identified.  The cardiomediastinal silhouette is mildly enlarged. No displaced rib fractures are seen. Cervical spinal fusion hardware is partially imaged.  IMPRESSION: 1. Endotracheal tube seen ending at the carina. This should be retracted 2-3 cm. 2. Left basilar airspace opacification may reflect atelectasis or possibly aspiration. Small left pleural effusion seen. 3. Vascular congestion and mild cardiomegaly noted.  These results were called by telephone at the time of interpretation on 01-25-2014 at 4:02 am to Dr. Glynn Octave, who verbally acknowledged these results.   Electronically Signed   By: Roanna Raider M.D.   On: 2014/01/25 04:03   ASSESSMENT / PLAN:  PULMONARY Trach 12/4 (note, replaced in ED as EMS removed trach and inserted ETT directly into stoma) >>> A: Acute on chronic hypoxic respiratory failure ? PE - unable to perform CTA at this time due to AKI. ? Aspiration Left pleural effusion Tracheostomy status P:   Full mechanical support until family arrives, contacted multiple times and are delaying arrival and not willing to make decisions over the phone. D/C all lab draws, x-rays...etc.  CARDIOVASCULAR Port left chest 9/15 >>> A:  Asystolic cardiac arrest Shock - SBP in the 40's, DNR A.fib RVR Hx HTN, CHF - last echo from 10/23/13: EF 70% P:  D/C all interventions.  RENAL A:   Hyponatremia Hyperkalemia - s/p Ca, insulin, bicarb in ED AKI AG metabolic acidosis - presumed lactate P:   D/C IVF D/C further blood draws.  GASTROINTESTINAL A:   GI prophylaxis Protein calorie malnutrition - Albumin 2.2 Nutrition P:   No interventions.  HEMATOLOGIC A:   Anemia of chronic disease and critical illness ? DVT / PE P:  D/C further blood draws.  INFECTIOUS A:   ? Aspiration P:   BCx2 12/4 >>> UCx 12/4 >>> Sputum Cx 12/4 >>> Abx: Vanc, start date  12/4>>>12/4 Abx: Zosyn, start date 12/4>>>12/4  D/C all abx.  ENDOCRINE A:   No known issues P:   D/C SSI and CBGs.  NEUROLOGIC A:   Quadriplegia Hx anxiety, depression, schizophrenia Head CT with diffuse edema, neuro testing completely flat Perfusion scan consistent with brain death. Neuro exam consistent with brain death. P:   No sedation, no interventions, patient is brain dead.  Family updated: Family contacted on multiple occasions throughout the night.  Informed that patient is brain dead.  They repeatedly said they will arrive at 7 AM but as of now are not here.  I called sister but she did not answer the phone.  I suspect they do not want to make decision to take patient off life support.  SBP in the 40's currently.  Patient will likely have a cardiac arrest shortly.  Will not extubate for now until family arrives.  The patient is critically ill with multiple organ systems failure and requires high complexity decision making for assessment and support, frequent evaluation and titration of therapies, application of advanced monitoring technologies and extensive interpretation of multiple databases.   Critical Care Time devoted to patient care services described in this note is  35 Minutes. This time reflects time of care of this signee Dr Koren BoundWesam Laren Whaling. This critical care time does not reflect procedure time, or teaching time or supervisory time of PA/NP/Med student/Med Resident etc but could involve care discussion time.  Alyson ReedyWesam G. Livy Ross, M.D. [facility]/Critical Care Medicine. Pager: [phone]. After hours pager: [phone].

## 2014-02-12 DEATH — deceased
